# Patient Record
Sex: Male | Born: 1960
Health system: Southern US, Community
[De-identification: ages and names within clinical notes are randomized; demographics above are authoritative.]

## PROBLEM LIST (undated history)

## (undated) DIAGNOSIS — K219 Gastro-esophageal reflux disease without esophagitis: Secondary | ICD-10-CM

## (undated) DIAGNOSIS — I10 Essential (primary) hypertension: Secondary | ICD-10-CM

## (undated) DIAGNOSIS — F419 Anxiety disorder, unspecified: Secondary | ICD-10-CM

## (undated) DIAGNOSIS — N289 Disorder of kidney and ureter, unspecified: Secondary | ICD-10-CM

## (undated) DIAGNOSIS — F32A Depression, unspecified: Secondary | ICD-10-CM

## (undated) DIAGNOSIS — F329 Major depressive disorder, single episode, unspecified: Secondary | ICD-10-CM

## (undated) DIAGNOSIS — K769 Liver disease, unspecified: Secondary | ICD-10-CM

## (undated) DIAGNOSIS — F1010 Alcohol abuse, uncomplicated: Secondary | ICD-10-CM

---
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
## 2019-03-23 ENCOUNTER — Other Ambulatory Visit: Payer: Self-pay

## 2019-03-23 ENCOUNTER — Encounter (HOSPITAL_BASED_OUTPATIENT_CLINIC_OR_DEPARTMENT_OTHER): Payer: Self-pay | Admitting: *Deleted

## 2019-03-23 ENCOUNTER — Emergency Department (HOSPITAL_BASED_OUTPATIENT_CLINIC_OR_DEPARTMENT_OTHER): Payer: Self-pay

## 2019-03-23 ENCOUNTER — Inpatient Hospital Stay (HOSPITAL_BASED_OUTPATIENT_CLINIC_OR_DEPARTMENT_OTHER)
Admission: EM | Admit: 2019-03-23 | Discharge: 2019-04-05 | DRG: 854 | Disposition: A | Discharge status: Home Health | Payer: Self-pay | Attending: Family Medicine | Admitting: Family Medicine

## 2019-03-23 DIAGNOSIS — Z1159 Encounter for screening for other viral diseases: Secondary | ICD-10-CM

## 2019-03-23 DIAGNOSIS — K819 Cholecystitis, unspecified: Secondary | ICD-10-CM

## 2019-03-23 DIAGNOSIS — F10239 Alcohol dependence with withdrawal, unspecified: Secondary | ICD-10-CM | POA: Diagnosis present

## 2019-03-23 DIAGNOSIS — F17210 Nicotine dependence, cigarettes, uncomplicated: Secondary | ICD-10-CM | POA: Diagnosis present

## 2019-03-23 DIAGNOSIS — K8010 Calculus of gallbladder with chronic cholecystitis without obstruction: Secondary | ICD-10-CM | POA: Diagnosis present

## 2019-03-23 DIAGNOSIS — E860 Dehydration: Secondary | ICD-10-CM | POA: Diagnosis present

## 2019-03-23 DIAGNOSIS — R6889 Other general symptoms and signs: Secondary | ICD-10-CM

## 2019-03-23 DIAGNOSIS — R109 Unspecified abdominal pain: Secondary | ICD-10-CM

## 2019-03-23 DIAGNOSIS — R21 Rash and other nonspecific skin eruption: Secondary | ICD-10-CM | POA: Diagnosis present

## 2019-03-23 DIAGNOSIS — K760 Fatty (change of) liver, not elsewhere classified: Secondary | ICD-10-CM | POA: Diagnosis present

## 2019-03-23 DIAGNOSIS — R6510 Systemic inflammatory response syndrome (SIRS) of non-infectious origin without acute organ dysfunction: Secondary | ICD-10-CM | POA: Diagnosis present

## 2019-03-23 DIAGNOSIS — R1084 Generalized abdominal pain: Secondary | ICD-10-CM

## 2019-03-23 DIAGNOSIS — R0789 Other chest pain: Secondary | ICD-10-CM

## 2019-03-23 DIAGNOSIS — K2970 Gastritis, unspecified, without bleeding: Secondary | ICD-10-CM | POA: Diagnosis present

## 2019-03-23 DIAGNOSIS — I129 Hypertensive chronic kidney disease with stage 1 through stage 4 chronic kidney disease, or unspecified chronic kidney disease: Secondary | ICD-10-CM | POA: Diagnosis present

## 2019-03-23 DIAGNOSIS — N182 Chronic kidney disease, stage 2 (mild): Secondary | ICD-10-CM | POA: Diagnosis present

## 2019-03-23 DIAGNOSIS — M549 Dorsalgia, unspecified: Secondary | ICD-10-CM

## 2019-03-23 DIAGNOSIS — E872 Acidosis, unspecified: Secondary | ICD-10-CM | POA: Diagnosis present

## 2019-03-23 DIAGNOSIS — R072 Precordial pain: Secondary | ICD-10-CM

## 2019-03-23 DIAGNOSIS — K862 Cyst of pancreas: Secondary | ICD-10-CM | POA: Diagnosis present

## 2019-03-23 DIAGNOSIS — E878 Other disorders of electrolyte and fluid balance, not elsewhere classified: Secondary | ICD-10-CM | POA: Diagnosis present

## 2019-03-23 DIAGNOSIS — E876 Hypokalemia: Secondary | ICD-10-CM | POA: Diagnosis present

## 2019-03-23 DIAGNOSIS — K863 Pseudocyst of pancreas: Secondary | ICD-10-CM | POA: Diagnosis present

## 2019-03-23 DIAGNOSIS — K8689 Other specified diseases of pancreas: Secondary | ICD-10-CM

## 2019-03-23 DIAGNOSIS — E871 Hypo-osmolality and hyponatremia: Secondary | ICD-10-CM | POA: Diagnosis present

## 2019-03-23 DIAGNOSIS — A411 Sepsis due to other specified staphylococcus: Principal | ICD-10-CM | POA: Diagnosis present

## 2019-03-23 DIAGNOSIS — H109 Unspecified conjunctivitis: Secondary | ICD-10-CM | POA: Diagnosis present

## 2019-03-23 DIAGNOSIS — F1010 Alcohol abuse, uncomplicated: Secondary | ICD-10-CM | POA: Diagnosis present

## 2019-03-23 DIAGNOSIS — R10819 Abdominal tenderness, unspecified site: Secondary | ICD-10-CM

## 2019-03-23 DIAGNOSIS — I10 Essential (primary) hypertension: Secondary | ICD-10-CM | POA: Diagnosis present

## 2019-03-23 HISTORY — DX: Alcohol abuse, uncomplicated: F10.10

## 2019-03-23 HISTORY — DX: Disorder of kidney and ureter, unspecified: N28.9

## 2019-03-23 HISTORY — DX: Essential (primary) hypertension: I10

## 2019-03-23 HISTORY — DX: Liver disease, unspecified: K76.9

## 2019-03-23 LAB — SARS CORONAVIRUS 2 AG (30 MIN TAT): SARS Coronavirus 2 Ag: NEGATIVE

## 2019-03-23 LAB — URINALYSIS, ROUTINE W REFLEX MICROSCOPIC
Bilirubin Urine: NEGATIVE
Glucose, UA: NEGATIVE mg/dL
Hgb urine dipstick: NEGATIVE
Ketones, ur: NEGATIVE mg/dL
Leukocytes,Ua: NEGATIVE
Nitrite: NEGATIVE
Protein, ur: NEGATIVE mg/dL
Specific Gravity, Urine: 1.01 (ref 1.005–1.030)
pH: 8.5 — ABNORMAL HIGH (ref 5.0–8.0)

## 2019-03-23 LAB — LACTIC ACID, PLASMA
Lactic Acid, Venous: 4.7 mmol/L (ref 0.5–1.9)
Lactic Acid, Venous: 3.8 mmol/L (ref 0.5–1.9)

## 2019-03-23 LAB — COMPREHENSIVE METABOLIC PANEL
ALT: 59 U/L — ABNORMAL HIGH (ref 0–44)
AST: 170 U/L — ABNORMAL HIGH (ref 15–41)
Albumin: 3.1 g/dL — ABNORMAL LOW (ref 3.5–5.0)
Alkaline Phosphatase: 166 U/L — ABNORMAL HIGH (ref 38–126)
Anion gap: 12 (ref 5–15)
BUN: <5 mg/dL — ABNORMAL LOW (ref 6–20)
CO2: 33 mmol/L — ABNORMAL HIGH (ref 22–32)
Calcium: 8.5 mg/dL — ABNORMAL LOW (ref 8.9–10.3)
Chloride: 88 mmol/L — ABNORMAL LOW (ref 98–111)
Creatinine, Ser: 0.82 mg/dL (ref 0.61–1.24)
GFR calc Af Amer: >60 mL/min (ref >60)
GFR calc non Af Amer: >60 mL/min (ref >60)
Glucose, Bld: 126 mg/dL — ABNORMAL HIGH (ref 70–99)
Potassium: 3.4 mmol/L — ABNORMAL LOW (ref 3.5–5.1)
Sodium: 133 mmol/L — ABNORMAL LOW (ref 135–145)
Total Bilirubin: 3.8 mg/dL — ABNORMAL HIGH (ref 0.3–1.2)
Total Protein: 6.8 g/dL (ref 6.5–8.1)

## 2019-03-23 LAB — CBC WITH DIFFERENTIAL/PLATELET
Abs Immature Granulocytes: 0.02 K/uL (ref 0.00–0.07)
Basophils Absolute: 0 K/uL (ref 0.0–0.1)
Basophils Relative: 1 %
Eosinophils Absolute: 0 K/uL (ref 0.0–0.5)
Eosinophils Relative: 0 %
HCT: 42.5 % (ref 39.0–52.0)
Hemoglobin: 14.9 g/dL (ref 13.0–17.0)
Immature Granulocytes: 0 %
Lymphocytes Relative: 30 %
Lymphs Abs: 1.9 K/uL (ref 0.7–4.0)
MCH: 36.7 pg — ABNORMAL HIGH (ref 26.0–34.0)
MCHC: 35.1 g/dL (ref 30.0–36.0)
MCV: 104.7 fL — ABNORMAL HIGH (ref 80.0–100.0)
Monocytes Absolute: 0.6 K/uL (ref 0.1–1.0)
Monocytes Relative: 10 %
Neutro Abs: 3.8 K/uL (ref 1.7–7.7)
Neutrophils Relative %: 59 %
Platelets: 137 K/uL — ABNORMAL LOW (ref 150–400)
RBC: 4.06 MIL/uL — ABNORMAL LOW (ref 4.22–5.81)
RDW: 14.6 % (ref 11.5–15.5)
WBC: 6.4 K/uL (ref 4.0–10.5)
nRBC: 0 % (ref 0.0–0.2)

## 2019-03-23 LAB — D-DIMER, QUANTITATIVE: D-Dimer, Quant: 1.22 ug/mL-FEU — ABNORMAL HIGH (ref 0.00–0.50)

## 2019-03-23 LAB — TROPONIN I: Troponin I: <0.03 ng/mL (ref <0.03)

## 2019-03-23 MED ORDER — HEPARIN SODIUM (PORCINE) 5000 UNIT/ML IJ SOLN
5000.0000 [IU] | Freq: Three times a day (TID) | INTRAMUSCULAR | Status: DC
  Administered 2019-03-24 – 2019-03-29 (×13): 5000 [IU] via SUBCUTANEOUS
  Filled 2019-03-23 (×13): qty 1

## 2019-03-23 MED ORDER — SODIUM CHLORIDE 0.9 % IV SOLN
2.0000 g | Freq: Once | INTRAVENOUS | Status: AC
  Administered 2019-03-23: 2 g via INTRAVENOUS
  Filled 2019-03-23: qty 2

## 2019-03-23 MED ORDER — VANCOMYCIN HCL 1000 MG IV SOLR
INTRAVENOUS | Status: AC
  Filled 2019-03-23: qty 1000

## 2019-03-23 MED ORDER — IOHEXOL 350 MG/ML SOLN
100.0000 mL | Freq: Once | INTRAVENOUS | Status: AC | PRN
  Administered 2019-03-23: 100 mL via INTRAVENOUS

## 2019-03-23 MED ORDER — VANCOMYCIN HCL IN DEXTROSE 750-5 MG/150ML-% IV SOLN
750.0000 mg | Freq: Three times a day (TID) | INTRAVENOUS | Status: DC
  Administered 2019-03-24 – 2019-03-29 (×15): 750 mg via INTRAVENOUS
  Filled 2019-03-23 (×18): qty 150

## 2019-03-23 MED ORDER — ADULT MULTIVITAMIN W/MINERALS CH
1.0000 | ORAL_TABLET | Freq: Every day | ORAL | Status: DC
  Administered 2019-03-24 – 2019-04-05 (×13): 1 via ORAL
  Filled 2019-03-23 (×13): qty 1

## 2019-03-23 MED ORDER — SODIUM CHLORIDE 0.9 % IV BOLUS (SEPSIS)
1000.0000 mL | Freq: Once | INTRAVENOUS | Status: AC
  Administered 2019-03-23: 1000 mL via INTRAVENOUS

## 2019-03-23 MED ORDER — ONDANSETRON HCL 4 MG PO TABS
4.0000 mg | ORAL_TABLET | Freq: Four times a day (QID) | ORAL | Status: DC | PRN
  Administered 2019-03-24 – 2019-03-26 (×3): 4 mg via ORAL
  Filled 2019-03-23 (×3): qty 1

## 2019-03-23 MED ORDER — DEXTROSE-NACL 5-0.9 % IV SOLN
INTRAVENOUS | Status: DC
  Administered 2019-03-23 – 2019-03-26 (×4): via INTRAVENOUS

## 2019-03-23 MED ORDER — CEFEPIME HCL 2 G IJ SOLR
INTRAMUSCULAR | Status: AC
  Filled 2019-03-23: qty 2

## 2019-03-23 MED ORDER — SODIUM CHLORIDE 0.9 % IV BOLUS (SEPSIS)
250.0000 mL | Freq: Once | INTRAVENOUS | Status: AC
  Administered 2019-03-23: 250 mL via INTRAVENOUS

## 2019-03-23 MED ORDER — VITAMIN B-1 100 MG PO TABS
100.0000 mg | ORAL_TABLET | Freq: Every day | ORAL | Status: DC
  Administered 2019-03-24 – 2019-04-05 (×13): 100 mg via ORAL
  Filled 2019-03-23 (×13): qty 1

## 2019-03-23 MED ORDER — METRONIDAZOLE IN NACL 5-0.79 MG/ML-% IV SOLN
500.0000 mg | Freq: Once | INTRAVENOUS | Status: AC
  Filled 2019-03-23: qty 100

## 2019-03-23 MED ORDER — VANCOMYCIN HCL 500 MG IV SOLR
INTRAVENOUS | Status: AC
  Filled 2019-03-23: qty 500

## 2019-03-23 MED ORDER — SODIUM CHLORIDE 0.9 % IV SOLN
2.0000 g | Freq: Three times a day (TID) | INTRAVENOUS | Status: DC
  Administered 2019-03-24 – 2019-03-25 (×3): 2 g via INTRAVENOUS
  Filled 2019-03-23 (×6): qty 2

## 2019-03-23 MED ORDER — ONDANSETRON HCL 4 MG/2ML IJ SOLN
4.0000 mg | Freq: Four times a day (QID) | INTRAMUSCULAR | Status: DC | PRN
  Administered 2019-03-24 – 2019-04-01 (×4): 4 mg via INTRAVENOUS
  Filled 2019-03-23 (×4): qty 2

## 2019-03-23 MED ORDER — THIAMINE HCL 100 MG/ML IJ SOLN
Freq: Once | INTRAVENOUS | Status: AC
  Administered 2019-03-24: via INTRAVENOUS
  Filled 2019-03-23: qty 1000

## 2019-03-23 MED ORDER — SODIUM CHLORIDE 0.9 % IV BOLUS
1000.0000 mL | Freq: Once | INTRAVENOUS | Status: AC
  Administered 2019-03-23: 1000 mL via INTRAVENOUS

## 2019-03-23 MED ORDER — VANCOMYCIN HCL IN DEXTROSE 1-5 GM/200ML-% IV SOLN: 1000.0000 mg | Freq: Once | INTRAVENOUS | Status: DC

## 2019-03-23 MED ORDER — VANCOMYCIN HCL 10 G IV SOLR
1500.0000 mg | Freq: Once | INTRAVENOUS | Status: AC
  Administered 2019-03-23: 1500 mg via INTRAVENOUS
  Filled 2019-03-23: qty 1500

## 2019-03-23 NOTE — H&P (Addendum)
History and Physical   [name]:[id number] DOB: 21-Mar-1961 DOA: 03/23/2019  Referring MD/NP/PA: Dr Jacqulyn Bath, [facility]  PCP: Patient, No Pcp Per   Patient coming from: Home via [facility] [place]  Chief Complaint: Generalized weakness and body aches  HPI: Tom Bender is a 58 y.o. male with medical history significant of alcohol abuse, tobacco abuse, chronic kidney disease stage II, hypertension, presenting with progressive weakness body aches headaches chest pain and shortness of breath over the last 1 to 2 weeks.  Patient also reported some dizziness and falls.  Also abdominal pain in the epigastric region which is rated as 8 out of 10.  It starts from the epigastric region and goes downwards towards the umbilicus.  No association with meals.  Symptoms have been progressively getting worse.  Patient abuses alcohol and has had issues with alcoholism.  He was seen at [facility] where he was evaluated.  Afebrile but elevated LFTs.  CT abdomen pelvis which indicating possible pancreatic cyst.  Also patient having significant shortness of breath he is a smoker but no history of COPD.  He was tachypneic.  Patient being admitted to the hospital with SIRS.  Empiric antibiotics started..  ED Course: Temperature is 98.5 blood pressure 133/95 pulse 118 respirate of 28 oxygen sat 99% on room air.  Sodium 133 potassium 3.4 chloride 88 CO2 33 with glucose 126.  Alkaline phosphatase 166 albumin 3.1 AST 170 ALT 59 total bilirubin 3.8.  Lactic acid 4.7.  CBC within normal except for platelets 137.  Chest x-ray showed no active disease.  CT chest and abdomen and pelvis showed 1.7 x 0.8 cm cystic area in the tail of pancreas.  Sludge in gallbladder.  No other significant findings.  Hepatic steatosis also noted.  Patient to [facility] for possible source.  Review of Systems: As per HPI otherwise 10 point review of systems negative.    Past Medical History:  Diagnosis Date  . ETOH  abuse   . Hypertension   . Liver disease   . Renal insufficiency     History reviewed. No pertinent surgical history.   reports that he has been smoking. He has never used smokeless tobacco. He reports current alcohol use of about 12.0 standard drinks of alcohol per week. He reports previous drug use.  No Known Allergies  History reviewed. No pertinent family history.   Prior to Admission medications   Not on File    Physical Exam: Vitals:   03/23/19 1900 03/23/19 1930 03/23/19 2030 03/23/19 2158  BP: 121/89 (!) 122/94 (!) 116/94 112/87  Pulse: (!) 102 97 91 77  Resp: 12 13 15    Temp:    98.4 F (36.9 C)  TempSrc:    Oral  SpO2: 96% 94% 94% 99%  Weight:    76.7 kg  Height:    5\' 9"  (1.753 m)      Constitutional: NAD, anxious,  Vitals:   03/23/19 1900 03/23/19 1930 03/23/19 2030 03/23/19 2158  BP: 121/89 (!) 122/94 (!) 116/94 112/87  Pulse: (!) 102 97 91 77  Resp: 12 13 15    Temp:    98.4 F (36.9 C)  TempSrc:    Oral  SpO2: 96% 94% 94% 99%  Weight:    76.7 kg  Height:    5\' 9"  (1.753 m)   Eyes: PERRL, lids and conjunctivae normal ENMT: Mucous membranes are moist. Posterior pharynx clear of any exudate or lesions.Normal dentition.  Neck: normal, supple,  no masses, no thyromegaly Respiratory: clear to auscultation bilaterally, no wheezing, no crackles. Normal respiratory effort. No accessory muscle use.  Cardiovascular: Sinus tachycardia, no murmurs / rubs / gallops. No extremity edema. 2+ pedal pulses. No carotid bruits.  Abdomen: Mild abdominal, tenderness, no masses palpated. No hepatosplenomegaly. Bowel sounds positive.  Musculoskeletal: no clubbing / cyanosis. No joint deformity upper and lower extremities. Good ROM, no contractures. Normal muscle tone.  Skin: no rashes, lesions, ulcers. No induration Neurologic: CN 2-12 grossly intact. Sensation intact, DTR normal. Strength 5/5 in all 4.  Psychiatric: Normal judgment and insight. Alert and oriented x 3.  Normal mood.     Labs on Admission: I have personally reviewed following labs and imaging studies  CBC: Recent Labs  Lab 03/23/19 1606  WBC 6.4  NEUTROABS 3.8  HGB 14.9  HCT 42.5  MCV 104.7*  PLT 137*   Basic Metabolic Panel: Recent Labs  Lab 03/23/19 1606  NA 133*  K 3.4*  CL 88*  CO2 33*  GLUCOSE 126*  BUN <5*  CREATININE 0.82  CALCIUM 8.5*   GFR: Estimated Creatinine Clearance: 101.8 mL/min (by C-G formula based on SCr of 0.82 mg/dL). Liver Function Tests: Recent Labs  Lab 03/23/19 1606  AST 170*  ALT 59*  ALKPHOS 166*  BILITOT 3.8*  PROT 6.8  ALBUMIN 3.1*   No results for input(s): LIPASE, AMYLASE in the last 168 hours. No results for input(s): AMMONIA in the last 168 hours. Coagulation Profile: No results for input(s): INR, PROTIME in the last 168 hours. Cardiac Enzymes: Recent Labs  Lab 03/23/19 1606  TROPONINI <0.03   BNP (last 3 results) No results for input(s): PROBNP in the last 8760 hours. HbA1C: No results for input(s): HGBA1C in the last 72 hours. CBG: No results for input(s): GLUCAP in the last 168 hours. Lipid Profile: No results for input(s): CHOL, HDL, LDLCALC, TRIG, CHOLHDL, LDLDIRECT in the last 72 hours. Thyroid Function Tests: No results for input(s): TSH, T4TOTAL, FREET4, T3FREE, THYROIDAB in the last 72 hours. Anemia Panel: No results for input(s): VITAMINB12, FOLATE, FERRITIN, TIBC, IRON, RETICCTPCT in the last 72 hours. Urine analysis:    Component Value Date/Time   COLORURINE YELLOW 03/23/2019 1606   APPEARANCEUR CLEAR 03/23/2019 1606   LABSPEC 1.010 03/23/2019 1606   PHURINE 8.5 (H) 03/23/2019 1606   GLUCOSEU NEGATIVE 03/23/2019 1606   HGBUR NEGATIVE 03/23/2019 1606   BILIRUBINUR NEGATIVE 03/23/2019 1606   KETONESUR NEGATIVE 03/23/2019 1606   PROTEINUR NEGATIVE 03/23/2019 1606   NITRITE NEGATIVE 03/23/2019 1606   LEUKOCYTESUR NEGATIVE 03/23/2019 1606   Sepsis Labs: @LABRCNTIP (procalcitonin:4,lacticidven:4) )  Recent Results (from the past 240 hour(s))  Blood Culture (routine x 2)     Status: None (Preliminary result)   Collection Time: 03/23/19  4:00 PM  Result Value Ref Range Status   Specimen Description   Final    BLOOD RIGHT ANTECUBITAL Performed at [facility], [address] [place], [place] [address]    Special Requests   Final    BOTTLES DRAWN AEROBIC AND ANAEROBIC Blood Culture adequate volume Performed at [facility] [place], [address] [place], [place] [address]    Culture PENDING  Incomplete   Report Status PENDING  Incomplete  SARS Coronavirus 2 (Hosp order,Performed in [facility] lab via [employer] [facility])     Status: None   Collection Time: 03/23/19  5:41 PM  Result Value Ref Range Status   SARS Coronavirus 2 ([employer] Now) NEGATIVE NEGATIVE Final  Comment: (NOTE) Interpretive Result Comment(s): COVID 19 Positive SARS CoV 2 target nucleic acids are DETECTED. The SARS CoV 2 RNA is generally detectable in upper and lower respiratory specimens during the acute phase of infection.  Positive results are indicative of active infection with SARS CoV 2.  Clinical correlation with patient history and other diagnostic information is necessary to determine patient infection status.  Positive results do not rule out bacterial infection or coinfection with other viruses. The expected result is Negative. COVID 19 Negative SARS CoV 2 target nucleic acids are NOT DETECTED. The SARS CoV 2 RNA is generally detectable in upper and lower respiratory specimens during the acute phase of infection.  Negative results do not preclude SARS CoV 2 infection, do not rule out coinfections with other pathogens, and should not be used as the sole basis for treatment or other patient management decisions.  Negative results must be combined with clinical  observations, patient history, and epidemiological information. The expected result is Negative. Invalid Presence or absence of SARS  CoV 2 nucleic acids cannot be determined. Repeat testing was performed on the submitted specimen and repeated Invalid results were obtained.  If clinically indicated, additional testing on a new specimen with an alternate test methodology [id number]) is advised.  The SARS CoV 2 RNA is generally detectable in upper and lower respiratory specimens during the acute phase of infection. The expected result is Negative. Fact Sheet for Patients:  http://www.graves-ford.org/ Fact Sheet for Healthcare Providers: EnviroConcern.si This test is not yet approved or cleared by the [place] FDA and has been authorized for detection and/or diagnosis of SARS CoV 2 by FDA under an Emergency Use Authorization (EUA).  This EUA will remain in effect (meaning this test can be used) for the duration of the COVID19 d eclaration under Section 564(b)(1) of the Act, 21 U.S.C. section [phone] 3(b)(1), unless the authorization is terminated or revoked sooner. Performed at [facility], [address] [place], [place] [address]      Radiological Exams on Admission: Ct Angio Chest Pe W And/or Wo Contrast  Result Date: 03/23/2019 CLINICAL DATA:  Chest pain and cough. Elevated D-dimer. Abdominal pain with elevated lipase EXAM: CT ANGIOGRAPHY CHEST CT ABDOMEN AND PELVIS WITH CONTRAST TECHNIQUE: Multidetector CT imaging of the chest was performed using the standard protocol during bolus administration of intravenous contrast. Multiplanar CT image reconstructions and MIPs were obtained to evaluate the vascular anatomy. Multidetector CT imaging of the abdomen and pelvis was performed using the standard protocol during bolus administration of intravenous contrast. CONTRAST:  OMNIPAQUE IOHEXOL 350 MG/ML SOLN COMPARISON:  Chest radiograph March 23, 2019 FINDINGS: CTA CHEST FINDINGS Cardiovascular: There is no demonstrable pulmonary embolus. There is no thoracic aortic  aneurysm or dissection. There is tortuosity in the descending thoracic aorta. The visualized great vessels appear unremarkable. There is no pericardial effusion or pericardial thickening. Mediastinum/Nodes: Thyroid appears normal. There are subcentimeter mediastinal lymph nodes. There is no adenopathy by size criteria in the thorax. No esophageal lesions are demonstrable. Lungs/Pleura: There is underlying centrilobular emphysematous change. No edema or consolidation. No pleural effusion or pleural thickening evident. Musculoskeletal: There are no blastic or lytic bone lesions. There is degenerative change in the thoracic spine. There are no evident chest wall lesions. Review of the MIP images confirms the above findings. CT ABDOMEN and PELVIS FINDINGS Hepatobiliary: There is hepatic steatosis. No focal liver lesions are appreciable. There is apparent sludge in the gallbladder. There may be superimposed small gallstones  within the gallbladder. The gallbladder wall does not appear appreciably thickened. There is no biliary duct dilatation. Pancreas: In the tail of the pancreas, there is a small cystic area containing a focus of calcification measuring 1.7 x 0.8 cm. Elsewhere, there is no pancreatic mass or pancreatic duct dilatation. There is no peripancreatic fluid or pancreatic enlargement. Pancreatic enhancement is homogeneous throughout. Spleen: No splenic lesions are evident. Adrenals/Urinary Tract: Adrenals bilaterally appear normal. Kidneys bilaterally show no evident mass or hydronephrosis on either side. There is no appreciable renal or ureteral calculus on either side. Urinary bladder is midline with wall thickness within normal limits. Stomach/Bowel: There is no appreciable bowel wall or mesenteric thickening. No evident bowel obstruction. Terminal ileum appears unremarkable. There is no appreciable free air or portal venous air. Vascular/Lymphatic: There is no abdominal aortic aneurysm. There is aortic and  iliac artery atherosclerosis. There is no evident adenopathy in the abdomen or pelvis. Reproductive: There are a few tiny prostatic calculi. Prostate and seminal vesicles are normal in size and contour. There is no evident pelvic mass. Other: Appendix appears normal. There is no appreciable abscess or ascites in the abdomen or pelvis. Musculoskeletal: There is degenerative change in the lumbar spine. There are no blastic or lytic bone lesions. No abdominal wall lesions evident. Review of the MIP images confirms the above findings. IMPRESSION: CT angiogram chest: 1. No demonstrable pulmonary embolus. No thoracic aortic aneurysm or dissection. 2. Underlying centrilobular emphysematous change. No edema or consolidation. 3.  No demonstrable thoracic adenopathy. CT abdomen and pelvis: 1. There is a 1.7 x 0.8 cm cystic area in the tail of the pancreas containing a focus of calcification. Questions small cystic neoplasm versus residua of inflammation. Pancreas otherwise appears normal. That this lesion may warrant nonemergent pancreatic MR pre and post-contrast to further evaluate. 2. Sludge in gallbladder. There may be small intermingled gallstones. 3. No evident bowel obstruction. No abscess in the abdomen or pelvis. Appendix appears normal. 4. No evident renal or ureteral calculi. No hydronephrosis. Urinary bladder wall thickness normal. 5.  There is hepatic steatosis. 6.  There is aortoiliac atherosclerosis. Electronically Signed   By: Bretta Bang III M.D.   On: 03/23/2019 18:57   Ct Abdomen Pelvis W Contrast  Result Date: 03/23/2019 : CT angiogram chest as well as CT abdomen and pelvis reports are combined into a single dictation. Electronically Signed   By: Bretta Bang III M.D.   On: 03/23/2019 19:01   Dg Chest Portable 1 View  Result Date: 03/23/2019 CLINICAL DATA:  Generalized body aches. Headache. Chest pain. Shortness of breath. Cough. EXAM: PORTABLE CHEST 1 VIEW COMPARISON:  No prior. FINDINGS:  Mediastinum and hilar structures normal. Lungs are clear. No pleural effusion or pneumothorax. Heart size normal. Degenerative change thoracic spine. IMPRESSION: No acute cardiopulmonary disease. Electronically Signed   By: Maisie Fus  Register   On: 03/23/2019 16:25    Assessment/Plan Principal Problem:   SIRS (systemic inflammatory response syndrome) (HCC) Active Problems:   Hyponatremia   Hypokalemia   Benign essential HTN   Alcohol abuse   Lactic acidosis   Pancreatic cyst     #1 SIRS: Has lactic acidosis also.  More than likely alcoholic hepatitis but only mild elevation of LFTs.  Patient on empiric antibiotics.  Cultures already obtained and pending.  Hydrate patient and monitor.  Initial COVID-19 at [facility] is negative but it is thereabouts type.  Repeat in-house COVID-19 testing.  #2 hyponatremia: Appears to be secondary to  dehydration.  Hyponatremia with hypochloremia.  Check serum osmolality  #3 hypokalemia: Potassium repleted.  #4 alcohol abuse: Patient is showing no evidence of withdrawal.  Monitor for possible CIWA initiation.  Thiamine and folic acid  #5 pancreatic cyst on CT: No evidence of abdominal pain.  Evaluation for possible malignancy or pseudocyst.  May require MRI of the abdomen.  #6 hypertension: Monitor blood pressure and continue home regimen.     DVT prophylaxis: Heparin Code Status: Full code Family Communication: Care discussed fully with the patient Disposition Plan: Home Consults called: None Admission status: Inpatient  Severity of Illness: The appropriate patient status for this patient is INPATIENT. Inpatient status is judged to be reasonable and necessary in order to provide the required intensity of service to ensure the patient's safety. The patient's presenting symptoms, physical exam findings, and initial radiographic and laboratory data in the context of their chronic comorbidities is felt to place them at high risk for further  clinical deterioration. Furthermore, it is not anticipated that the patient will be medically stable for discharge from the hospital within 2 midnights of admission. The following factors support the patient status of inpatient.   " The patient's presenting symptoms include generalized weakness shortness of breath. " The worrisome physical exam findings include palpitation " The initial radiographic and laboratory data are worrisome because of CT findings. " The chronic co-morbidities include alcoholism.   * I certify that at the point of admission it is my clinical judgment that the patient will require inpatient hospital care spanning beyond 2 midnights from the point of admission due to high intensity of service, high risk for further deterioration and high frequency of surveillance required.Lonia Blood MD [facility] Pager [phone]  If 7PM-7AM, please contact night-coverage [email or web address] Password [email or web address]  03/23/2019, 10:59 PM

## 2019-03-23 NOTE — ED Provider Notes (Signed)
Emergency Department Provider Note   I have reviewed the triage vital signs and the nursing notes.   HISTORY  Chief Complaint Generalized Body Aches   HPI Tom Bender is a 58 y.o. male with PMH of EtOH abuse, CKD, and HTN presents to the emergency department for evaluation of generalized body ache, headache, chest pain, epigastric abdominal pain, shortness of breath, dizziness, falls.  Patient states symptoms have been worsening over the past several days.  He does have history of alcohol use/abuse.  He denies fevers but feels he could have had some which were not recorded.  He has had some diaphoresis and occasional shaking chills.  His chest pain is constant and central.  Shortness of breath is also constant.  He does smoke cigarettes but denies known history of COPD.  He denies known sick contacts.  His abdominal pain is mainly epigastric and not particularly worse with eating.  No vomiting but has had multiple episodes of diarrhea.  No blood in the diarrhea.  Past Medical History:  Diagnosis Date  . ETOH abuse   . Hypertension   . Liver disease   . Renal insufficiency     Patient Active Problem List   Diagnosis Date Noted  . SIRS (systemic inflammatory response syndrome) (HCC) 03/23/2019  . Hyponatremia 03/23/2019  . Hypokalemia 03/23/2019  . Benign essential HTN 03/23/2019  . Alcohol abuse 03/23/2019  . Lactic acidosis 03/23/2019  . Pancreatic cyst 03/23/2019    History reviewed. No pertinent surgical history.  Allergies Patient has no known allergies.  History reviewed. No pertinent family history.  Social History Social History   Tobacco Use  . Smoking status: Current Every Day Smoker  . Smokeless tobacco: Never Used  Substance Use Topics  . Alcohol use: Yes    Alcohol/week: 12.0 standard drinks    Types: 12 Cans of beer per week  . Drug use: Not Currently    Review of Systems  Constitutional: Subjective fever/chills Eyes: No visual changes. ENT:  No sore throat. Cardiovascular: Positive chest pain. Respiratory: Positive shortness of breath. Gastrointestinal: Positive epigastric abdominal pain. Positive nausea, no vomiting. Positive diarrhea.  No constipation. Genitourinary: Negative for dysuria. Musculoskeletal: Negative for back pain. Skin: Negative for rash. Neurological: Negative for focal weakness or numbness. Positive HA.   10-point ROS otherwise negative.  ____________________________________________   PHYSICAL EXAM:  VITAL SIGNS: ED Triage Vitals  Enc Vitals Group     BP 03/23/19 1538 (!) 133/95     Pulse Rate 03/23/19 1538 (!) 118     Resp 03/23/19 1538 (!) 28     Temp 03/23/19 1538 98.4 F (36.9 C)     Temp Source 03/23/19 1538 Oral     SpO2 03/23/19 1538 97 %     Weight 03/23/19 1538 160 lb (72.6 kg)     Height 03/23/19 1538  (1.753 m)     Pain Score 03/23/19 1537 9   Constitutional: Alert and oriented. Well appearing and in no acute distress. Eyes: Conjunctivae are normal.  Head: Atraumatic. Nose: No congestion/rhinnorhea. Mouth/Throat: Mucous membranes are moist.  Neck: No stridor.   Cardiovascular: Tachycardia. Good peripheral circulation. Grossly normal heart sounds.   Respiratory: Normal respiratory effort.  No retractions. Lungs CTAB. Gastrointestinal: Soft and nontender. No distention.  Musculoskeletal: No lower extremity tenderness nor edema. No gross deformities of extremities. Neurologic:  Normal speech and language. No gross focal neurologic deficits are appreciated.  Skin:  Skin is warm, dry and intact. No rash noted.  ____________________________________________   LABS (all labs ordered are listed, but only abnormal results are displayed)  Labs Reviewed  COMPREHENSIVE METABOLIC PANEL - Abnormal; Notable for the following components:      Result Value   Sodium 133 (*)    Potassium 3.4 (*)    Chloride 88 (*)    CO2 33 (*)    Glucose, Bld 126 (*)    BUN <5 (*)    Calcium 8.5  (*)    Albumin 3.1 (*)    AST 170 (*)    ALT 59 (*)    Alkaline Phosphatase 166 (*)    Total Bilirubin 3.8 (*)    All other components within normal limits  LACTIC ACID, PLASMA - Abnormal; Notable for the following components:   Lactic Acid, Venous 4.7 (*)    All other components within normal limits  LACTIC ACID, PLASMA - Abnormal; Notable for the following components:   Lactic Acid, Venous 3.8 (*)    All other components within normal limits  CBC WITH DIFFERENTIAL/PLATELET - Abnormal; Notable for the following components:   RBC 4.06 (*)    MCV 104.7 (*)    MCH 36.7 (*)    Platelets 137 (*)    All other components within normal limits  URINALYSIS, ROUTINE W REFLEX MICROSCOPIC - Abnormal; Notable for the following components:   pH 8.5 (*)    All other components within normal limits  D-DIMER, QUANTITATIVE (NOT AT [facility]) - Abnormal; Notable for the following components:   D-Dimer, Quant 1.22 (*)    All other components within normal limits  SARS CORONAVIRUS 2 (HOSP ORDER, PERFORMED IN [facility] VIA ABBOTT ID)  CULTURE, BLOOD (ROUTINE X 2)  URINE CULTURE  CULTURE, BLOOD (ROUTINE X 2)  SARS CORONAVIRUS 2 (HOSPITAL ORDER, PERFORMED IN [facility])  TROPONIN I  HIV ANTIBODY (ROUTINE TESTING W REFLEX)  CBC  CREATININE, SERUM  COMPREHENSIVE METABOLIC PANEL  CBC   ____________________________________________  EKG   EKG Interpretation  Date/Time:  Friday March 23 2019 16:23:45 EDT Ventricular Rate:  93 PR Interval:    QRS Duration: 82 QT Interval:  372 QTC Calculation: 463 R Axis:   34 Text Interpretation:  Sinus rhythm No STEMI  Confirmed by Alona BeneLong, Joshua [phone]) on 03/23/2019 5:54:09 PM       ____________________________________________  RADIOLOGY  Ct Angio Chest Pe W And/or Wo Contrast  Result Date: 03/23/2019 CLINICAL DATA:  Chest pain and cough. Elevated D-dimer. Abdominal pain with elevated lipase EXAM: CT ANGIOGRAPHY CHEST CT ABDOMEN AND PELVIS  WITH CONTRAST TECHNIQUE: Multidetector CT imaging of the chest was performed using the standard protocol during bolus administration of intravenous contrast. Multiplanar CT image reconstructions and MIPs were obtained to evaluate the vascular anatomy. Multidetector CT imaging of the abdomen and pelvis was performed using the standard protocol during bolus administration of intravenous contrast. CONTRAST:  100mL OMNIPAQUE IOHEXOL 350 MG/ML SOLN COMPARISON:  Chest radiograph March 23, 2019 FINDINGS: CTA CHEST FINDINGS Cardiovascular: There is no demonstrable pulmonary embolus. There is no thoracic aortic aneurysm or dissection. There is tortuosity in the descending thoracic aorta. The visualized great vessels appear unremarkable. There is no pericardial effusion or pericardial thickening. Mediastinum/Nodes: Thyroid appears normal. There are subcentimeter mediastinal lymph nodes. There is no adenopathy by size criteria in the thorax. No esophageal lesions are demonstrable. Lungs/Pleura: There is underlying centrilobular emphysematous change. No edema or consolidation. No pleural effusion or pleural thickening evident. Musculoskeletal: There are no blastic or lytic bone lesions.  There is degenerative change in the thoracic spine. There are no evident chest wall lesions. Review of the MIP images confirms the above findings. CT ABDOMEN and PELVIS FINDINGS Hepatobiliary: There is hepatic steatosis. No focal liver lesions are appreciable. There is apparent sludge in the gallbladder. There may be superimposed small gallstones within the gallbladder. The gallbladder wall does not appear appreciably thickened. There is no biliary duct dilatation. Pancreas: In the tail of the pancreas, there is a small cystic area containing a focus of calcification measuring 1.7 x 0.8 cm. Elsewhere, there is no pancreatic mass or pancreatic duct dilatation. There is no peripancreatic fluid or pancreatic enlargement. Pancreatic enhancement is  homogeneous throughout. Spleen: No splenic lesions are evident. Adrenals/Urinary Tract: Adrenals bilaterally appear normal. Kidneys bilaterally show no evident mass or hydronephrosis on either side. There is no appreciable renal or ureteral calculus on either side. Urinary bladder is midline with wall thickness within normal limits. Stomach/Bowel: There is no appreciable bowel wall or mesenteric thickening. No evident bowel obstruction. Terminal ileum appears unremarkable. There is no appreciable free air or portal venous air. Vascular/Lymphatic: There is no abdominal aortic aneurysm. There is aortic and iliac artery atherosclerosis. There is no evident adenopathy in the abdomen or pelvis. Reproductive: There are a few tiny prostatic calculi. Prostate and seminal vesicles are normal in size and contour. There is no evident pelvic mass. Other: Appendix appears normal. There is no appreciable abscess or ascites in the abdomen or pelvis. Musculoskeletal: There is degenerative change in the lumbar spine. There are no blastic or lytic bone lesions. No abdominal wall lesions evident. Review of the MIP images confirms the above findings. IMPRESSION: CT angiogram chest: 1. No demonstrable pulmonary embolus. No thoracic aortic aneurysm or dissection. 2. Underlying centrilobular emphysematous change. No edema or consolidation. 3.  No demonstrable thoracic adenopathy. CT abdomen and pelvis: 1. There is a 1.7 x 0.8 cm cystic area in the tail of the pancreas containing a focus of calcification. Questions small cystic neoplasm versus residua of inflammation. Pancreas otherwise appears normal. That this lesion may warrant nonemergent pancreatic MR pre and post-contrast to further evaluate. 2. Sludge in gallbladder. There may be small intermingled gallstones. 3. No evident bowel obstruction. No abscess in the abdomen or pelvis. Appendix appears normal. 4. No evident renal or ureteral calculi. No hydronephrosis. Urinary bladder wall  thickness normal. 5.  There is hepatic steatosis. 6.  There is aortoiliac atherosclerosis. Electronically Signed   By: Bretta Bang III M.D.   On: 03/23/2019 18:57   Ct Abdomen Pelvis W Contrast  Result Date: 03/23/2019 : CT angiogram chest as well as CT abdomen and pelvis reports are combined into a single dictation. Electronically Signed   By: Bretta Bang III M.D.   On: 03/23/2019 19:01   Dg Chest Portable 1 View  Result Date: 03/23/2019 CLINICAL DATA:  Generalized body aches. Headache. Chest pain. Shortness of breath. Cough. EXAM: PORTABLE CHEST 1 VIEW COMPARISON:  No prior. FINDINGS: Mediastinum and hilar structures normal. Lungs are clear. No pleural effusion or pneumothorax. Heart size normal. Degenerative change thoracic spine. IMPRESSION: No acute cardiopulmonary disease. Electronically Signed   By: Maisie Fus  Register   On: 03/23/2019 16:25    ____________________________________________   PROCEDURES  Procedure(s) performed:   Procedures  CRITICAL CARE Performed by: Maia Plan Total critical care time: 35 minutes Critical care time was exclusive of separately billable procedures and treating other patients. Critical care was necessary to treat or prevent imminent or life-threatening deterioration.  Critical care was time spent personally by me on the following activities: development of treatment plan with patient and/or surrogate as well as nursing, discussions with consultants, evaluation of patient's response to treatment, examination of patient, obtaining history from patient or surrogate, ordering and performing treatments and interventions, ordering and review of laboratory studies, ordering and review of radiographic studies, pulse oximetry and re-evaluation of patient's condition.  Alona Bene, MD Emergency Medicine  ____________________________________________   INITIAL IMPRESSION / ASSESSMENT AND PLAN / ED COURSE  Pertinent labs & imaging results that  were available during my care of the patient were reviewed by me and considered in my medical decision making (see chart for details).   Patient presents to the emergency department with multiple falls in the setting of constitutional type symptoms including body aches, subjective fevers, coughing. COVID is a consideration but patient also with multiple medical comorbidities and risk factors which could lend itself to underlying serious bacterial infection.  Patient also at risk for PE given his smoking history.  Abdomen with diffuse mild tenderness but no focal severe discomfort or peritoneal findings.  To start with initial screening lab work including lactate, d-dimer, troponin.  EKG shows sinus rhythm with no acute ischemic findings.   CT imaging reviewed.  Sludge in the gallbladder but no gallbladder wall inflammation or biliary duct dilation.  No clear source for fever and elevated lactate.  Covered with broad-spectrum antibiotics initially.  Plan for admit for sepsis without a source at this time.  Patient does drink heavily which may be doing some of his biliary lab work.  Possible cholangitis was considered but antibiotic should cover for this pending admit.   Discussed patient's case with Hospitalist, Dr. Toniann Fail to request admission. Patient and family (if present) updated with plan. Care transferred to Hospitalist service.  I reviewed all nursing notes, vitals, pertinent old records, EKGs, labs, imaging (as available).  ____________________________________________  FINAL CLINICAL IMPRESSION(S) / ED DIAGNOSES  Final diagnoses:  Precordial chest pain  Generalized abdominal pain  Flu-like symptoms  Lactic acidosis     MEDICATIONS GIVEN DURING THIS VISIT:  Medications  metroNIDAZOLE (FLAGYL) IVPB 500 mg (has no administration in time range)  ceFEPIme (MAXIPIME) 2 g injection (  Not Given 03/23/19 1720)  ceFEPIme (MAXIPIME) 2 g in sodium chloride 0.9 % 100 mL IVPB (has no  administration in time range)  vancomycin (VANCOCIN) IVPB 750 mg/150 ml premix (has no administration in time range)  vancomycin (VANCOCIN) 1000 MG powder (  Not Given 03/23/19 1842)  ceFEPIme (MAXIPIME) 2 g injection (  Not Given 03/23/19 1729)  vancomycin (VANCOCIN) 500 MG powder (  Not Given 03/23/19 1843)  heparin injection 5,000 Units (has no administration in time range)  sodium chloride 0.9 % 1,000 mL with thiamine 100 mg, folic acid 1 mg, multivitamins adult 10 mL infusion (has no administration in time range)  dextrose 5 %-0.9 % sodium chloride infusion (has no administration in time range)  ondansetron (ZOFRAN) tablet 4 mg (has no administration in time range)    Or  ondansetron (ZOFRAN) injection 4 mg (has no administration in time range)  multivitamin with minerals tablet 1 tablet (has no administration in time range)  thiamine (VITAMIN B-1) tablet 100 mg (has no administration in time range)  sodium chloride 0.9 % bolus 1,000 mL (0 mLs Intravenous Stopped 03/23/19 1730)  ceFEPIme (MAXIPIME) 2 g in sodium chloride 0.9 % 100 mL IVPB (0 g Intravenous Stopped 03/23/19 1825)  vancomycin (VANCOCIN) 1,500 mg in  sodium chloride 0.9 % 500 mL IVPB ( Intravenous Stopped 03/23/19 2021)  sodium chloride 0.9 % bolus 1,000 mL (0 mLs Intravenous Stopped 03/23/19 1825)    And  sodium chloride 0.9 % bolus 250 mL (0 mLs Intravenous Stopped 03/23/19 1852)  iohexol (OMNIPAQUE) 350 MG/ML injection 100 mL (100 mLs Intravenous Contrast Given 03/23/19 1836)    Note:  This document was prepared using Dragon voice recognition software and may include unintentional dictation errors.  Alona Bene, MD Emergency Medicine    Long, Arlyss Repress, MD 03/23/19 [id number]

## 2019-03-23 NOTE — ED Triage Notes (Signed)
Pt c/o generalized body aches , h/a, chest pain, SOB  And dizziness with falls

## 2019-03-23 NOTE — ED Notes (Signed)
Pt aware of need for urine specimen.  Provided urinal.

## 2019-03-23 NOTE — ED Notes (Signed)
Patient transported to CT 

## 2019-03-23 NOTE — ED Notes (Signed)
Pt aware of need for urine specimen.  Unable to provide one at this time.

## 2019-03-23 NOTE — Progress Notes (Signed)
Pharmacy Antibiotic Note  Tom Bender is a 58 y.o. male admitted on 03/23/2019 with sepsis.  Pharmacy has been consulted for vancomycin/cefepime dosing. Afebrile, LA 4.7. SCr 0.82 on admit.  Plan: Cefepime 2g IV x 1; then 2g IV q8h Vancomycin 1500mg  IV x1; then Vancomycin 750 mg IV Q 8 hrs. Goal AUC 400-550. Expected AUC: 484 SCr used: 0.82 Flagyl 500mg  IV x 1 per EDP Monitor clinical progress, c/s, renal function F/u de-escalation plan/LOT, vancomycin trough as indicated   Height: 5\' 9"  (175.3 cm) Weight: 160 lb (72.6 kg) IBW/kg (Calculated) : 70.7  Temp (24hrs), Avg:98.4 F (36.9 C), Min:98.4 F (36.9 C), Max:98.4 F (36.9 C)  Recent Labs  Lab 03/23/19 1606  CREATININE 0.82  LATICACIDVEN 4.7*    Estimated Creatinine Clearance: 101.8 mL/min (by C-G formula based on SCr of 0.82 mg/dL).    Allergies not on file  Antimicrobials this admission: 6/5 vancomycin >>  6/5 cefepime >>  6/5 flagyl >>  Dose adjustments this admission:   Microbiology results:   Tom Bender, PharmD, BCPS Clinical Pharmacist 03/23/2019 4:57 PM

## 2019-03-24 ENCOUNTER — Observation Stay (HOSPITAL_COMMUNITY): Payer: Self-pay

## 2019-03-24 DIAGNOSIS — R1084 Generalized abdominal pain: Secondary | ICD-10-CM

## 2019-03-24 DIAGNOSIS — E872 Acidosis: Secondary | ICD-10-CM

## 2019-03-24 DIAGNOSIS — E876 Hypokalemia: Secondary | ICD-10-CM

## 2019-03-24 DIAGNOSIS — E871 Hypo-osmolality and hyponatremia: Secondary | ICD-10-CM

## 2019-03-24 DIAGNOSIS — K862 Cyst of pancreas: Secondary | ICD-10-CM

## 2019-03-24 DIAGNOSIS — I10 Essential (primary) hypertension: Secondary | ICD-10-CM

## 2019-03-24 DIAGNOSIS — F1010 Alcohol abuse, uncomplicated: Secondary | ICD-10-CM

## 2019-03-24 LAB — POTASSIUM: Potassium: 2.7 mmol/L — CL (ref 3.5–5.1)

## 2019-03-24 LAB — COMPREHENSIVE METABOLIC PANEL
ALT: 48 U/L — ABNORMAL HIGH (ref 0–44)
AST: 126 U/L — ABNORMAL HIGH (ref 15–41)
Albumin: 2.5 g/dL — ABNORMAL LOW (ref 3.5–5.0)
Alkaline Phosphatase: 140 U/L — ABNORMAL HIGH (ref 38–126)
Anion gap: 11 (ref 5–15)
BUN: <5 mg/dL — ABNORMAL LOW (ref 6–20)
CO2: 28 mmol/L (ref 22–32)
Calcium: 7.8 mg/dL — ABNORMAL LOW (ref 8.9–10.3)
Chloride: 97 mmol/L — ABNORMAL LOW (ref 98–111)
Creatinine, Ser: 0.73 mg/dL (ref 0.61–1.24)
GFR calc Af Amer: >60 mL/min (ref >60)
GFR calc non Af Amer: >60 mL/min (ref >60)
Glucose, Bld: 122 mg/dL — ABNORMAL HIGH (ref 70–99)
Potassium: 2.9 mmol/L — ABNORMAL LOW (ref 3.5–5.1)
Sodium: 136 mmol/L (ref 135–145)
Total Bilirubin: 3.3 mg/dL — ABNORMAL HIGH (ref 0.3–1.2)
Total Protein: 5.9 g/dL — ABNORMAL LOW (ref 6.5–8.1)

## 2019-03-24 LAB — BLOOD CULTURE ID PANEL (REFLEXED)

## 2019-03-24 LAB — MAGNESIUM: Magnesium: 1.4 mg/dL — ABNORMAL LOW (ref 1.7–2.4)

## 2019-03-24 LAB — LACTIC ACID, PLASMA: Lactic Acid, Venous: 1.7 mmol/L (ref 0.5–1.9)

## 2019-03-24 LAB — CBC
HCT: 37.3 % — ABNORMAL LOW (ref 39.0–52.0)
Hemoglobin: 13.1 g/dL (ref 13.0–17.0)
MCH: 36.5 pg — ABNORMAL HIGH (ref 26.0–34.0)
MCHC: 35.1 g/dL (ref 30.0–36.0)
MCV: 103.9 fL — ABNORMAL HIGH (ref 80.0–100.0)
Platelets: 112 10*3/uL — ABNORMAL LOW (ref 150–400)
RBC: 3.59 MIL/uL — ABNORMAL LOW (ref 4.22–5.81)
RDW: 15.1 % (ref 11.5–15.5)
WBC: 5.9 10*3/uL (ref 4.0–10.5)
nRBC: 0 % (ref 0.0–0.2)

## 2019-03-24 LAB — SARS CORONAVIRUS 2: SARS Coronavirus 2: NOT DETECTED

## 2019-03-24 LAB — LIPASE, BLOOD: Lipase: 35 U/L (ref 11–51)

## 2019-03-24 MED ORDER — LORAZEPAM 2 MG/ML IJ SOLN
1.0000 mg | Freq: Four times a day (QID) | INTRAMUSCULAR | Status: AC | PRN
  Administered 2019-03-25 – 2019-03-26 (×4): 1 mg via INTRAVENOUS
  Filled 2019-03-24 (×4): qty 1

## 2019-03-24 MED ORDER — MORPHINE SULFATE (PF) 2 MG/ML IV SOLN
2.0000 mg | INTRAVENOUS | Status: DC | PRN
  Administered 2019-03-24 – 2019-03-29 (×16): 2 mg via INTRAVENOUS
  Filled 2019-03-24 (×16): qty 1

## 2019-03-24 MED ORDER — FOLIC ACID 1 MG PO TABS
1.0000 mg | ORAL_TABLET | Freq: Every day | ORAL | Status: DC
  Administered 2019-03-24 – 2019-04-05 (×13): 1 mg via ORAL
  Filled 2019-03-24 (×13): qty 1

## 2019-03-24 MED ORDER — MAGNESIUM SULFATE 50 % IJ SOLN
3.0000 g | Freq: Once | INTRAVENOUS | Status: AC
  Administered 2019-03-24: 3 g via INTRAVENOUS
  Filled 2019-03-24: qty 6

## 2019-03-24 MED ORDER — LORAZEPAM 1 MG PO TABS
1.0000 mg | ORAL_TABLET | Freq: Four times a day (QID) | ORAL | Status: AC | PRN
  Administered 2019-03-24 – 2019-03-27 (×4): 1 mg via ORAL
  Filled 2019-03-24 (×4): qty 1

## 2019-03-24 MED ORDER — GADOBUTROL 1 MMOL/ML IV SOLN
8.0000 mL | Freq: Once | INTRAVENOUS | Status: AC | PRN
  Administered 2019-03-24: 8 mL via INTRAVENOUS

## 2019-03-24 MED ORDER — POTASSIUM CHLORIDE 10 MEQ/100ML IV SOLN
10.0000 meq | INTRAVENOUS | Status: AC
  Administered 2019-03-24 (×2): 10 meq via INTRAVENOUS
  Filled 2019-03-24 (×2): qty 100

## 2019-03-24 MED ORDER — ADULT MULTIVITAMIN W/MINERALS CH: 1.0000 | ORAL_TABLET | Freq: Every day | ORAL | Status: DC

## 2019-03-24 MED ORDER — POTASSIUM CHLORIDE CRYS ER 20 MEQ PO TBCR
40.0000 meq | EXTENDED_RELEASE_TABLET | Freq: Two times a day (BID) | ORAL | Status: AC
  Administered 2019-03-24 (×2): 40 meq via ORAL
  Filled 2019-03-24 (×2): qty 2

## 2019-03-24 NOTE — Progress Notes (Signed)
CRITICAL VALUE ALERT  Critical Value: K 2.7  Date & Time Notied:  03/24/2019 1055  Provider Notified: Yes  Orders Received/Actions taken: PO Potassium 40 mEq and IV Potassium x2.

## 2019-03-24 NOTE — Progress Notes (Signed)
PROGRESS NOTE    Dayquan Buys  WUJ:[id number] DOB: 04/22/1961 DOA: 03/23/2019 PCP: Patient, No Pcp Per    Brief Narrative: Divante Kotch is a 58 y.o. male with medical history significant of alcohol abuse, tobacco abuse, chronic kidney disease stage II, hypertension, presenting with progressive weakness body aches headaches chest pain and shortness of breath over the last 1 to 2 weeks.  Patient also reported some dizziness and falls.  Also abdominal pain in the epigastric region which is rated as 8 out of 10.  It starts from the epigastric region and goes downwards towards the umbilicus. He was admitted for evaluation of SIRS.    Assessment & Plan:   Principal Problem:   SIRS (systemic inflammatory response syndrome) (HCC) Active Problems:   Hyponatremia   Hypokalemia   Benign essential HTN   Alcohol abuse   Lactic acidosis   Pancreatic cyst  SIRS:  Unclear etiology. ? Acute cholecystitis vs alcoholic hepatitis vs dehydration. Blood cultures pending . No respiratory symptoms. UA is negative. Pt reports some epigastric pain. Lipase levels ordered.  Lactic acid normalized. Gently hydrate with IV fluids.  MRI of the abdomen shows Cystic lesion within the pancreatic tail, favored to represent a pseudocyst. Indolent cystic neoplasm could look similar. Per onsensus criteria, this warrants follow-up with pre and post-contrast MRI/MRCP at 1 year. Cholelithiasis with suspicion of mild gallbladder wall thickening. No specific evidence of acute cholecystitis.  He is afebrile and wbc count is wnl.   Hypokalemia and hypomagnesemia:  Replaced.   Essential hypertension;  Pt reports he was on metoprolol , but ran out  Of the medication.  Normotensive at this time.     Alcohol abuse;  On CIWA protocol.  Pt is having some withdrawal symptoms, tachycardic, tremulous, having tremors in his hands.   Hyponatremia: resolved with hydration.    Dehydration:  Continue with IV fluids for another  24 hours.      DVT prophylaxis: heparin  Code Status: full code.  Family Communication: none at bedside.  Disposition Plan: pending clinical improvement.   Consultants:   None.    Procedures: MRI of the abdomen.   Antimicrobials: vancomycin and cefepime.   Subjective: He reports feeling weak, exhausted and unable to walk from weakness.   Objective: Vitals:   03/23/19 2030 03/23/19 2158 03/24/19 0545 03/24/19 1158  BP: (!) 116/94 112/87 117/90 103/87  Pulse: 91 77 82 (!) 110  Resp: 15     Temp:  98.4 F (36.9 C) 98.2 F (36.8 C)   TempSrc:  Oral Oral   SpO2: 94% 99% 96%   Weight:  76.7 kg    Height:   (1.753 m)      Intake/Output Summary (Last 24 hours) at 03/24/2019 1226 Last data filed at 03/24/2019 0931 Gross per 24 hour  Intake 998.65 ml  Output 1050 ml  Net -51.35 ml   Filed Weights   03/23/19 1538 03/23/19 2158  Weight: 72.6 kg 76.7 kg    Examination:  General exam: Appears mild distress from alcohol withdrawal symptoms.  Respiratory system: Clear to auscultation. Respiratory effort normal. Cardiovascular system: S1 & S2 heard, tachycardic.  No JVD, . Gastrointestinal system: Abdomen is soft, mildly tender in the epigastric area.  Central nervous system: Alert and oriented. No focal neurological deficits. Extremities: Symmetric 5 x 5 power. Skin: No rashes, lesions or ulcers Psychiatry:  Mood & affect appropriate.     Data Reviewed: I have personally reviewed following labs and imaging studies  CBC:  Recent Labs  Lab 03/23/19 1606 03/24/19 0021  WBC 6.4 5.9  NEUTROABS 3.8  --   HGB 14.9 13.1  HCT 42.5 37.3*  MCV 104.7* 103.9*  PLT 137* 154*   Basic Metabolic Panel: Recent Labs  Lab 03/23/19 1606 03/24/19 0021 03/24/19 0950  NA 133* 136  --   K 3.4* 2.9* 2.7*  CL 88* 97*  --   CO2 33* 28  --   GLUCOSE 126* 122*  --   BUN <5* <5*  --   CREATININE 0.82 0.73  --   CALCIUM 8.5* 7.8*  --   MG  --   --  1.4*   GFR: Estimated  Creatinine Clearance: 104.3 mL/min (by C-G formula based on SCr of 0.73 mg/dL). Liver Function Tests: Recent Labs  Lab 03/23/19 1606 03/24/19 0021  AST 170* 126*  ALT 59* 48*  ALKPHOS 166* 140*  BILITOT 3.8* 3.3*  PROT 6.8 5.9*  ALBUMIN 3.1* 2.5*   No results for input(s): LIPASE, AMYLASE in the last 168 hours. No results for input(s): AMMONIA in the last 168 hours. Coagulation Profile: No results for input(s): INR, PROTIME in the last 168 hours. Cardiac Enzymes: Recent Labs  Lab 03/23/19 1606  TROPONINI <0.03   BNP (last 3 results) No results for input(s): PROBNP in the last 8760 hours. HbA1C: No results for input(s): HGBA1C in the last 72 hours. CBG: No results for input(s): GLUCAP in the last 168 hours. Lipid Profile: No results for input(s): CHOL, HDL, LDLCALC, TRIG, CHOLHDL, LDLDIRECT in the last 72 hours. Thyroid Function Tests: No results for input(s): TSH, T4TOTAL, FREET4, T3FREE, THYROIDAB in the last 72 hours. Anemia Panel: No results for input(s): VITAMINB12, FOLATE, FERRITIN, TIBC, IRON, RETICCTPCT in the last 72 hours. Sepsis Labs: Recent Labs  Lab 03/23/19 1606 03/23/19 1745 03/24/19 0950  LATICACIDVEN 4.7* 3.8* 1.7    Recent Results (from the past 240 hour(s))  Blood Culture (routine x 2)     Status: None (Preliminary result)   Collection Time: 03/23/19  4:00 PM  Result Value Ref Range Status   Specimen Description   Final    BLOOD RIGHT ANTECUBITAL Performed at [facility], [place] [address] [place], [place] [address]    Special Requests   Final    BOTTLES DRAWN AEROBIC AND ANAEROBIC Blood Culture adequate volume Performed at [facility], [address] [place], [place] [address]    Culture   Final    NO GROWTH < 12 HOURS Performed at [facility] [address], [place], [place] [address]    Report Status PENDING  Incomplete  Blood Culture (routine x 2)     Status: None (Preliminary result)   Collection Time:  03/23/19  5:00 PM  Result Value Ref Range Status   Specimen Description   Final    BLOOD RIGHT FOREARM Performed at [facility], [address] [place], [place] [address]    Special Requests   Final    BOTTLES DRAWN AEROBIC AND ANAEROBIC Blood Culture adequate volume Performed at [facility], [address] [place], [place] [address]    Culture  Setup Time   Final    GRAM POSITIVE COCCI AEROBIC BOTTLE ONLY Organism ID to follow    Culture   Final    NO GROWTH < 12 HOURS Performed at [facility], [place] [address] [place], [place] [address]    Report Status PENDING  Incomplete  SARS Coronavirus 2 (Hosp order,Performed in [facility] lab via [employer] ID)     Status: None   Collection Time: 03/23/19  5:41 PM  Result Value Ref Range Status   SARS Coronavirus 2 ([employer] ID Now) NEGATIVE NEGATIVE Final    Comment: (NOTE) Interpretive Result Comment(s): COVID 19 Positive SARS CoV 2 target nucleic acids are DETECTED. The SARS CoV 2 RNA is generally detectable in upper and lower respiratory specimens during the acute phase of infection.  Positive results are indicative of active infection with SARS CoV 2.  Clinical correlation with patient history and other diagnostic information is necessary to determine patient infection status.  Positive results do not rule out bacterial infection or coinfection with other viruses. The expected result is Negative. COVID 19 Negative SARS CoV 2 target nucleic acids are NOT DETECTED. The SARS CoV 2 RNA is generally detectable in upper and lower respiratory specimens during the acute phase of infection.  Negative results do not preclude SARS CoV 2 infection, do not rule out coinfections with other pathogens, and should not be used as the sole basis for treatment or other patient management decisions.  Negative results must be combined with clinical  observations, patient history, and epidemiological information. The  expected result is Negative. Invalid Presence or absence of SARS CoV 2 nucleic acids cannot be determined. Repeat testing was performed on the submitted specimen and repeated Invalid results were obtained.  If clinically indicated, additional testing on a new specimen with an alternate test methodology [phone]([id number]) is advised.  The SARS CoV 2 RNA is generally detectable in upper and lower respiratory specimens during the acute phase of infection. The expected result is Negative. Fact Sheet for Patients:  http://www.graves-ford.org/https://www.fda.gov/media/136524/download Fact Sheet for Healthcare Providers: EnviroConcern.sihttps://www.fda.gov/media/136523/download This test is not yet approved or cleared by the [place] FDA and has been authorized for detection and/or diagnosis of SARS CoV 2 by FDA under an Emergency Use Authorization (EUA).  This EUA will remain in effect (meaning this test can be used) for the duration of the COVID19 d eclaration under Section 564(b)(1) of the Act, 21 U.S.C. section [id number] 3(b)(1), unless the authorization is terminated or revoked sooner. Performed at [facility] [place], [address] [place], [place] [address]   SARS Coronavirus 2     Status: None   Collection Time: 03/24/19 12:55 AM  Result Value Ref Range Status   SARS Coronavirus 2 NOT DETECTED NOT DETECTED Final    Comment: (NOTE) SARS-CoV-2 target nucleic acids are NOT DETECTED. The SARS-CoV-2 RNA is generally detectable in upper and lower respiratory specimens during the acute phase of infection.  Negative  results do not preclude SARS-CoV-2 infection, do not rule out co-infections with other pathogens, and should not be used as the sole basis for treatment or other patient management decisions.  Negative results must be combined with clinical observations, patient history, and epidemiological information. The expected result is Not Detected. Fact Sheet for  Patients: http://www.biofiredefense.com/wp-content/uploads/2020/03/BIOFIRE-COVID -19-patients.pdf Fact Sheet for Healthcare Providers: http://www.biofiredefense.com/wp-content/uploads/2020/03/BIOFIRE-COVID -19-hcp.pdf This test is not yet approved or cleared by the [place] FDA and  has been authorized for detection and/or diagnosis of SARS-CoV-2 by FDA under an Emergency Use Authorization (EUA).  This EUA will remain in effec t (meaning this test can be used) for the duration of  the COVID-19 declaration under Section 564(b)(1) of the Act, 21 U.S.C. section 360bbb-3(b)(1), unless the authorization is terminated or revoked sooner. Performed at [facility], [address] [place], [place] [address]          Radiology Studies: Ct Angio Chest Pe W And/or Wo Contrast  Result Date: 03/23/2019 CLINICAL DATA:  Chest pain and cough. Elevated D-dimer. Abdominal pain with elevated lipase EXAM: CT ANGIOGRAPHY CHEST CT ABDOMEN AND PELVIS WITH CONTRAST TECHNIQUE: Multidetector CT imaging of the chest was performed using the standard protocol during bolus administration of intravenous contrast. Multiplanar CT image reconstructions and MIPs were obtained to evaluate the vascular anatomy. Multidetector CT imaging of the abdomen and pelvis was performed using the standard protocol during bolus administration of intravenous contrast. CONTRAST:  100mL OMNIPAQUE IOHEXOL 350 MG/ML SOLN COMPARISON:  Chest radiograph March 23, 2019 FINDINGS: CTA CHEST FINDINGS Cardiovascular: There is no demonstrable pulmonary embolus. There is no thoracic aortic aneurysm or dissection. There is tortuosity in the descending thoracic aorta. The visualized great vessels appear unremarkable. There is no pericardial effusion or pericardial thickening. Mediastinum/Nodes: Thyroid appears normal. There are subcentimeter mediastinal lymph nodes. There is no adenopathy by size criteria in the thorax. No esophageal lesions are  demonstrable. Lungs/Pleura: There is underlying centrilobular emphysematous change. No edema or consolidation. No pleural effusion or pleural thickening evident. Musculoskeletal: There are no blastic or lytic bone lesions. There is degenerative change in the thoracic spine. There are no evident chest wall lesions. Review of the MIP images confirms the above findings. CT ABDOMEN and PELVIS FINDINGS Hepatobiliary: There is hepatic steatosis. No focal liver lesions are appreciable. There is apparent sludge in the gallbladder. There may be superimposed small gallstones within the gallbladder. The gallbladder wall does not appear appreciably thickened. There is no biliary duct dilatation. Pancreas: In the tail of the pancreas, there is a small cystic area containing a focus of calcification measuring 1.7 x 0.8 cm. Elsewhere, there is no pancreatic mass or pancreatic duct dilatation. There is no peripancreatic fluid or pancreatic enlargement. Pancreatic enhancement is homogeneous throughout. Spleen: No splenic lesions are evident. Adrenals/Urinary Tract: Adrenals bilaterally appear normal. Kidneys bilaterally show no evident mass or hydronephrosis on either side. There is no appreciable renal or ureteral calculus on either side. Urinary bladder is midline with wall thickness within normal limits. Stomach/Bowel: There is no appreciable bowel wall or mesenteric thickening. No evident bowel obstruction. Terminal ileum appears unremarkable. There is no appreciable free air or portal venous air. Vascular/Lymphatic: There is no abdominal aortic aneurysm. There is aortic and iliac artery atherosclerosis. There is no evident adenopathy in the abdomen or pelvis. Reproductive: There are a few tiny prostatic calculi. Prostate and seminal vesicles are normal in size and contour. There is no evident pelvic mass. Other: Appendix appears normal. There is no appreciable abscess or ascites in the abdomen or pelvis. Musculoskeletal: There  is degenerative change in the lumbar spine. There are no blastic or lytic bone lesions. No abdominal wall lesions evident. Review of the MIP images confirms the above findings. IMPRESSION: CT angiogram chest: 1. No demonstrable pulmonary embolus. No thoracic aortic aneurysm or dissection. 2. Underlying centrilobular emphysematous change. No edema or consolidation. 3.  No demonstrable thoracic adenopathy. CT abdomen and pelvis: 1. There is a 1.7 x 0.8 cm cystic area in the tail of the pancreas containing a focus of calcification. Questions small cystic neoplasm versus residua of inflammation. Pancreas otherwise appears normal. That this lesion may warrant nonemergent pancreatic MR pre and post-contrast to further evaluate. 2. Sludge in gallbladder. There may be small intermingled gallstones. 3. No evident bowel obstruction. No abscess in the abdomen or pelvis.  Appendix appears normal. 4. No evident renal or ureteral calculi. No hydronephrosis. Urinary bladder wall thickness normal. 5.  There is hepatic steatosis. 6.  There is aortoiliac atherosclerosis. Electronically Signed   By: Bretta BangWilliam  Woodruff III M.D.   On: 03/23/2019 18:57   Mr Abdomen W Wo Contrast  Result Date: 03/24/2019 CLINICAL DATA:  Pancreatic tail mass on abdominal CT EXAM: MRI ABDOMEN WITHOUT AND WITH CONTRAST TECHNIQUE: Multiplanar multisequence MR imaging of the abdomen was performed both before and after the administration of intravenous contrast. CONTRAST:  8 cc Gadavist COMPARISON:  03/23/2019 CT FINDINGS: Lower chest: Normal heart size without pericardial or pleural effusion. Hepatobiliary: Moderate hepatic steatosis. Gallstones on the order of 6 mm. Possible mild gallbladder wall thickening at 5 mm on 28/8. No pericholecystic fluid. No biliary duct dilatation. Pancreas: Corresponding to the CT abnormality, within the pancreatic tail, is a 1.4 cm T2 hyperintense, T1 hypointense lesion without significant postcontrast enhancement.  Calcification, as evidenced by increased signal on long TE weighted imaging. No main duct dilatation or communication with the main duct. No peripancreatic edema. Spleen:  Normal in size, without focal abnormality. Adrenals/Urinary Tract: Normal adrenal glands. Normal kidneys, without hydronephrosis. Stomach/Bowel: Normal stomach and abdominal bowel loops. Vascular/Lymphatic: Normal caliber of the aorta and branch vessels. No retroperitoneal or retrocrural adenopathy. Other:  No ascites. Musculoskeletal: No acute osseous abnormality. IMPRESSION: 1. Cystic lesion within the pancreatic tail, favored to represent a pseudocyst. Indolent cystic neoplasm could look similar. Per consensus criteria, this warrants follow-up with pre and post-contrast MRI/MRCP at 1 year. This recommendation follows ACR consensus guidelines: Management of Incidental Pancreatic Cysts: A White Paper of the ACR Incidental Findings Committee. J Am Coll Radiol 2017;14:911-923. 2. Cholelithiasis with suspicion of mild gallbladder wall thickening. No specific evidence of acute cholecystitis. 3. Hepatic steatosis. Electronically Signed   By: Jeronimo GreavesKyle  Talbot M.D.   On: 03/24/2019 12:00   Ct Abdomen Pelvis W Contrast  Result Date: 03/23/2019 : CT angiogram chest as well as CT abdomen and pelvis reports are combined into a single dictation. Electronically Signed   By: Bretta BangWilliam  Woodruff III M.D.   On: 03/23/2019 19:01   Mr 3d Recon At Scanner  Result Date: 03/24/2019 CLINICAL DATA:  Pancreatic tail mass on abdominal CT EXAM: MRI ABDOMEN WITHOUT AND WITH CONTRAST TECHNIQUE: Multiplanar multisequence MR imaging of the abdomen was performed both before and after the administration of intravenous contrast. CONTRAST:  8 cc Gadavist COMPARISON:  03/23/2019 CT FINDINGS: Lower chest: Normal heart size without pericardial or pleural effusion. Hepatobiliary: Moderate hepatic steatosis. Gallstones on the order of 6 mm. Possible mild gallbladder wall thickening at  5 mm on 28/8. No pericholecystic fluid. No biliary duct dilatation. Pancreas: Corresponding to the CT abnormality, within the pancreatic tail, is a 1.4 cm T2 hyperintense, T1 hypointense lesion without significant postcontrast enhancement. Calcification, as evidenced by increased signal on long TE weighted imaging. No main duct dilatation or communication with the main duct. No peripancreatic edema. Spleen:  Normal in size, without focal abnormality. Adrenals/Urinary Tract: Normal adrenal glands. Normal kidneys, without hydronephrosis. Stomach/Bowel: Normal stomach and abdominal bowel loops. Vascular/Lymphatic: Normal caliber of the aorta and branch vessels. No retroperitoneal or retrocrural adenopathy. Other:  No ascites. Musculoskeletal: No acute osseous abnormality. IMPRESSION: 1. Cystic lesion within the pancreatic tail, favored to represent a pseudocyst. Indolent cystic neoplasm could look similar. Per consensus criteria, this warrants follow-up with pre and post-contrast MRI/MRCP at 1 year. This recommendation follows ACR consensus guidelines: Management of Incidental Pancreatic Cysts:  A White Paper of the ACR Incidental Findings Committee. J Am Coll Radiol 2017;14:911-923. 2. Cholelithiasis with suspicion of mild gallbladder wall thickening. No specific evidence of acute cholecystitis. 3. Hepatic steatosis. Electronically Signed   By: Jeronimo Greaves M.D.   On: 03/24/2019 12:00   Dg Chest Portable 1 View  Result Date: 03/23/2019 CLINICAL DATA:  Generalized body aches. Headache. Chest pain. Shortness of breath. Cough. EXAM: PORTABLE CHEST 1 VIEW COMPARISON:  No prior. FINDINGS: Mediastinum and hilar structures normal. Lungs are clear. No pleural effusion or pneumothorax. Heart size normal. Degenerative change thoracic spine. IMPRESSION: No acute cardiopulmonary disease. Electronically Signed   By: Maisie Fus  Register   On: 03/23/2019 16:25        Scheduled Meds:  folic acid  1 mg Oral Daily   heparin   5,000 Units Subcutaneous Q8H   multivitamin with minerals  1 tablet Oral Daily   potassium chloride  40 mEq Oral BID   thiamine  100 mg Oral Daily   Continuous Infusions:  ceFEPime (MAXIPIME) IV 2 g (03/24/19 1025)   dextrose 5 % and 0.9% NaCl 100 mL/hr at 03/24/19 0600   magnesium sulfate bolus IVPB     potassium chloride 10 mEq (03/24/19 1151)   vancomycin 750 mg (03/24/19 1127)     LOS: 0 days    Time spent: 35 minutes.     Kathlen Mody, MD [facility] Pager [phone]   If 7PM-7AM, please contact night-coverage [email or web address] Password [email or web address] 03/24/2019, 12:26 PM

## 2019-03-24 NOTE — Progress Notes (Signed)
PHARMACY - PHYSICIAN COMMUNICATION CRITICAL VALUE ALERT - BLOOD CULTURE IDENTIFICATION (BCID)  Tom Bender is an 58 y.o. male who presented to [facility] on 03/23/2019 with a chief complaint of generalized weakness, body aches, and abdominal pain. Patient was started on vancomycin and cefepime for SIRS.  Assessment: BCID panel revealed 2/4 bottles (different sets) growing methicillin resistant coagulase negative staph. Patient is afebrile and WBC is wnl. Source unclear.  Name of physician (or Provider) Contacted: Dr. Karleen Hampshire  Current antibiotics: Vancomycin 750 mg IV q8h and Cefepime 2 g IV q8h. Patient also received one dose of Flagyl in the ED.  Changes to prescribed antibiotics recommended:  -Continue Vancomycin 750 mg IV q8h -Discontinue cefepime 2 g IV q8h Recommendations accepted by provider  Results for orders placed or performed during the hospital encounter of 03/23/19  Blood Culture ID Panel (Reflexed) (Collected: 03/23/2019  5:00 PM)  Result Value Ref Range   Enterococcus species NOT DETECTED NOT DETECTED   Listeria monocytogenes NOT DETECTED NOT DETECTED   Staphylococcus species DETECTED (A) NOT DETECTED   Staphylococcus aureus (BCID) NOT DETECTED NOT DETECTED   Methicillin resistance DETECTED (A) NOT DETECTED   Streptococcus species NOT DETECTED NOT DETECTED   Streptococcus agalactiae NOT DETECTED NOT DETECTED   Streptococcus pneumoniae NOT DETECTED NOT DETECTED   Streptococcus pyogenes NOT DETECTED NOT DETECTED   Acinetobacter baumannii NOT DETECTED NOT DETECTED   Enterobacteriaceae species NOT DETECTED NOT DETECTED   Enterobacter cloacae complex NOT DETECTED NOT DETECTED   Escherichia coli NOT DETECTED NOT DETECTED   Klebsiella oxytoca NOT DETECTED NOT DETECTED   Klebsiella pneumoniae NOT DETECTED NOT DETECTED   Proteus species NOT DETECTED NOT DETECTED   Serratia marcescens NOT DETECTED NOT DETECTED   Haemophilus influenzae NOT DETECTED NOT DETECTED   Neisseria  meningitidis NOT DETECTED NOT DETECTED   Pseudomonas aeruginosa NOT DETECTED NOT DETECTED   Candida albicans NOT DETECTED NOT DETECTED   Candida glabrata NOT DETECTED NOT DETECTED   Candida krusei NOT DETECTED NOT DETECTED   Candida parapsilosis NOT DETECTED NOT DETECTED   Candida tropicalis NOT DETECTED NOT DETECTED    Jackson Latino, PharmD PGY1 Pharmacy Resident Phone [phone] 03/24/2019     2:31 PM

## 2019-03-24 NOTE — Plan of Care (Signed)

## 2019-03-25 ENCOUNTER — Inpatient Hospital Stay (HOSPITAL_COMMUNITY): Payer: Self-pay

## 2019-03-25 DIAGNOSIS — R7881 Bacteremia: Secondary | ICD-10-CM

## 2019-03-25 LAB — MAGNESIUM: Magnesium: 1.5 mg/dL — ABNORMAL LOW (ref 1.7–2.4)

## 2019-03-25 LAB — BASIC METABOLIC PANEL
Anion gap: 9 (ref 5–15)
BUN: <5 mg/dL — ABNORMAL LOW (ref 6–20)
CO2: 27 mmol/L (ref 22–32)
Calcium: 7.7 mg/dL — ABNORMAL LOW (ref 8.9–10.3)
Chloride: 98 mmol/L (ref 98–111)
Creatinine, Ser: 0.63 mg/dL (ref 0.61–1.24)
GFR calc Af Amer: >60 mL/min (ref >60)
GFR calc non Af Amer: >60 mL/min (ref >60)
Glucose, Bld: 117 mg/dL — ABNORMAL HIGH (ref 70–99)
Potassium: 3.1 mmol/L — ABNORMAL LOW (ref 3.5–5.1)
Sodium: 134 mmol/L — ABNORMAL LOW (ref 135–145)

## 2019-03-25 LAB — PHOSPHORUS: Phosphorus: 2.3 mg/dL — ABNORMAL LOW (ref 2.5–4.6)

## 2019-03-25 LAB — URINE CULTURE: Culture: NO GROWTH

## 2019-03-25 LAB — ECHOCARDIOGRAM COMPLETE
Height: 69 in
Weight: 2895.96 oz

## 2019-03-25 LAB — HIV ANTIBODY (ROUTINE TESTING W REFLEX): HIV Screen 4th Generation wRfx: NONREACTIVE

## 2019-03-25 MED ORDER — POTASSIUM CHLORIDE CRYS ER 20 MEQ PO TBCR
40.0000 meq | EXTENDED_RELEASE_TABLET | Freq: Two times a day (BID) | ORAL | Status: AC
  Administered 2019-03-25 (×2): 40 meq via ORAL
  Filled 2019-03-25 (×2): qty 2

## 2019-03-25 MED ORDER — MAGNESIUM SULFATE 50 % IJ SOLN
3.0000 g | Freq: Once | INTRAVENOUS | Status: AC
  Administered 2019-03-25: 3 g via INTRAVENOUS
  Filled 2019-03-25: qty 6

## 2019-03-25 NOTE — Progress Notes (Signed)
PROGRESS NOTE    Tom BenneBanks Maxim  RKY:[id number] DOB: January 16, 1961 DOA: 03/23/2019 PCP: Patient, No Pcp Per    Brief Narrative: Tom Bender is a 58 y.o. male with medical history significant of alcohol abuse, tobacco abuse, chronic kidney disease stage II, hypertension, presenting with progressive weakness body aches headaches chest pain and shortness of breath over the last 1 to 2 weeks.  Patient also reported some dizziness and falls.  Also abdominal pain in the epigastric region which is rated as 8 out of 10.  It starts from the epigastric region and goes downwards towards the umbilicus. He was admitted for evaluation of SIRS.    Assessment & Plan:   Principal Problem:   SIRS (systemic inflammatory response syndrome) (HCC) Active Problems:   Hyponatremia   Hypokalemia   Benign essential HTN   Alcohol abuse   Lactic acidosis   Pancreatic cyst  SIRS:  Staph bacteremia vs Acute cholecystitis vs alcoholic hepatitis vs dehydration. Blood cultures show methicillin resistance staph 2/4, sensitivities are pending . No respiratory symptoms. UA is negative. Pt reports some epigastric pain. Lipase levels wnl.  Lactic acid normalized. Gently hydrate with IV fluids.  MRI of the abdomen shows Cystic lesion within the pancreatic tail, favored to represent a pseudocyst. Indolent cystic neoplasm could look similar. Per onsensus criteria, this warrants follow-up with pre and post-contrast MRI/MRCP at 1 year. Cholelithiasis with suspicion of mild gallbladder wall thickening. No specific evidence of acute cholecystitis.  He is afebrile and wbc count is wnl.  He is on IV vancomycin.  Repeat cultures are pending.    Hypokalemia and hypomagnesemia:  Replaced.   Essential hypertension;  Pt reports he was on metoprolol , but ran out of the medication.  Normotensive at this time.    Alcohol abuse;  On CIWA protocol.  Pt is having some withdrawal symptoms, tachycardic, tremulous, having tremors in his  hands.   Hyponatremia: resolved with hydration.    Dehydration:  Continue with IV fluids for another 24 hours.      DVT prophylaxis: heparin  Code Status: full code.  Family Communication: none at bedside.  Disposition Plan: pending clinical improvement.   Consultants:   ID on the phone.    Procedures: MRI of the abdomen.   Antimicrobials: vancomycin and cefepime.   Subjective: Continues to be weak. Having tremors and tremulous.   Objective: Vitals:   03/24/19 2106 03/25/19 0509 03/25/19 0800 03/25/19 1200  BP: 107/78 110/79 (!) 164/132 108/78  Pulse: 74 77 (!) 105 (!) 104  Resp: (!) 23 17  (!) 23  Temp: 98.3 F (36.8 C) 98.8 F (37.1 C)    TempSrc: Oral Oral    SpO2: 98% 94% 98%   Weight:  82.1 kg    Height:        Intake/Output Summary (Last 24 hours) at 03/25/2019 1312 Last data filed at 03/25/2019 0900 Gross per 24 hour  Intake 2899.15 ml  Output 1910 ml  Net 989.15 ml   Filed Weights   03/23/19 1538 03/23/19 2158 03/25/19 0509  Weight: 72.6 kg 76.7 kg 82.1 kg    Examination:  General exam: Appears mild distress from alcohol withdrawal symptoms.  Respiratory system: Clear to auscultation. Respiratory effort normal.no wheezing or rhonchi.  Cardiovascular system: S1 & S2 heard, tachycardic.  No JVD, . Gastrointestinal system: Abdomen is soft, non tender non distended. Bowel sounds.  Central nervous system: Alert and oriented. No focal neurological deficits. Extremities: Symmetric 5 x 5 power. Skin: No rashes, lesions  or ulcers Psychiatry:  Mood & affect appropriate.     Data Reviewed: I have personally reviewed following labs and imaging studies  CBC: Recent Labs  Lab 03/23/19 1606 03/24/19 0021  WBC 6.4 5.9  NEUTROABS 3.8  --   HGB 14.9 13.1  HCT 42.5 37.3*  MCV 104.7* 103.9*  PLT 137* 112*   Basic Metabolic Panel: Recent Labs  Lab 03/23/19 1606 03/24/19 0021 03/24/19 0950 03/25/19 1124  NA 133* 136  --  134*  K 3.4* 2.9* 2.7*  3.1*  CL 88* 97*  --  98  CO2 33* 28  --  27  GLUCOSE 126* 122*  --  117*  BUN <5* <5*  --  <5*  CREATININE 0.82 0.73  --  0.63  CALCIUM 8.5* 7.8*  --  7.7*  MG  --   --  1.4* 1.5*  PHOS  --   --   --  2.3*   GFR: Estimated Creatinine Clearance: 104.3 mL/min (by C-G formula based on SCr of 0.63 mg/dL). Liver Function Tests: Recent Labs  Lab 03/23/19 1606 03/24/19 0021  AST 170* 126*  ALT 59* 48*  ALKPHOS 166* 140*  BILITOT 3.8* 3.3*  PROT 6.8 5.9*  ALBUMIN 3.1* 2.5*   Recent Labs  Lab 03/24/19 0950  LIPASE 35   No results for input(s): AMMONIA in the last 168 hours. Coagulation Profile: No results for input(s): INR, PROTIME in the last 168 hours. Cardiac Enzymes: Recent Labs  Lab 03/23/19 1606  TROPONINI <0.03   BNP (last 3 results) No results for input(s): PROBNP in the last 8760 hours. HbA1C: No results for input(s): HGBA1C in the last 72 hours. CBG: No results for input(s): GLUCAP in the last 168 hours. Lipid Profile: No results for input(s): CHOL, HDL, LDLCALC, TRIG, CHOLHDL, LDLDIRECT in the last 72 hours. Thyroid Function Tests: No results for input(s): TSH, T4TOTAL, FREET4, T3FREE, THYROIDAB in the last 72 hours. Anemia Panel: No results for input(s): VITAMINB12, FOLATE, FERRITIN, TIBC, IRON, RETICCTPCT in the last 72 hours. Sepsis Labs: Recent Labs  Lab 03/23/19 1606 03/23/19 1745 03/24/19 0950  LATICACIDVEN 4.7* 3.8* 1.7    Recent Results (from the past 240 hour(s))  Blood Culture (routine x 2)     Status: Abnormal (Preliminary result)   Collection Time: 03/23/19  4:00 PM  Result Value Ref Range Status   Specimen Description   Final    BLOOD RIGHT ANTECUBITAL Performed at [facility], [address], [place], [place] [address]    Special Requests   Final    BOTTLES DRAWN AEROBIC AND ANAEROBIC Blood Culture adequate volume Performed at [facility], [address] [place], [place] [address]    Culture  Setup Time    Final    GRAM POSITIVE COCCI AEROBIC BOTTLE ONLY CRITICAL VALUE NOTED.  VALUE IS CONSISTENT WITH PREVIOUSLY REPORTED AND CALLED VALUE. Performed at [facility], [address] [place] [address], [place], [place] [address]    Culture STAPHYLOCOCCUS SPECIES (COAGULASE NEGATIVE) (A)  Final   Report Status PENDING  Incomplete  Urine culture     Status: None   Collection Time: 03/23/19  4:06 PM  Result Value Ref Range Status   Specimen Description   Final    URINE, CLEAN CATCH Performed at [facility], [address] [place], [place] [address]    Special Requests   Final    NONE Performed at [facility], [address]  [place], [place] [address]    Culture   Final    NO GROWTH Performed at [facility], [address] [place], [place] [address]    Report Status 03/25/2019 FINAL  Final  Blood Culture (routine x 2)     Status: Abnormal (Preliminary result)   Collection Time: 03/23/19  5:00 PM  Result Value Ref Range Status   Specimen Description   Final    BLOOD RIGHT FOREARM Performed at [facility] [place], [address] [place], [place] [address]    Special Requests   Final    BOTTLES DRAWN AEROBIC AND ANAEROBIC Blood Culture adequate volume Performed at [facility] [place], [address] [place], [place] [address]    Culture  Setup Time   Final    GRAM POSITIVE COCCI AEROBIC BOTTLE ONLY CRITICAL RESULT CALLED TO, READ BACK BY AND VERIFIED WITH: C WHITESIDE AT 1400 ON [id number] BY SJW    Culture (A)  Final    STAPHYLOCOCCUS SPECIES (COAGULASE NEGATIVE) SUSCEPTIBILITIES TO FOLLOW Performed at [facility], [address] [place], [place] [address]    Report Status PENDING  Incomplete  Blood Culture ID Panel (Reflexed)     Status: Abnormal   Collection Time: 03/23/19  5:00 PM  Result Value Ref Range Status   Enterococcus species NOT DETECTED NOT DETECTED Final   Listeria monocytogenes NOT DETECTED NOT DETECTED Final   Staphylococcus  species DETECTED (A) NOT DETECTED Final    Comment: Methicillin (oxacillin) resistant coagulase negative staphylococcus. Possible blood culture contaminant (unless isolated from more than one blood culture draw or clinical case suggests pathogenicity). No antibiotic treatment is indicated for blood  culture contaminants. CRITICAL RESULT CALLED TO, READ BACK BY AND VERIFIED WITH: WHITESIDE,C AT 1400 ON [id number] BY SJW    Staphylococcus aureus (BCID) NOT DETECTED NOT DETECTED Final   Methicillin resistance DETECTED (A) NOT DETECTED Final    Comment: CRITICAL RESULT CALLED TO, READ BACK BY AND VERIFIED WITH: WHITESIDE, C PHARMD AT 1400 ON [id number] BY SJW    Streptococcus species NOT DETECTED NOT DETECTED Final   Streptococcus agalactiae NOT DETECTED NOT DETECTED Final   Streptococcus pneumoniae NOT DETECTED NOT DETECTED Final   Streptococcus pyogenes NOT DETECTED NOT DETECTED Final   Acinetobacter baumannii NOT DETECTED NOT DETECTED Final   Enterobacteriaceae species NOT DETECTED NOT DETECTED Final   Enterobacter cloacae complex NOT DETECTED NOT DETECTED Final   Escherichia coli NOT DETECTED NOT DETECTED Final   Klebsiella oxytoca NOT DETECTED NOT DETECTED Final   Klebsiella pneumoniae NOT DETECTED NOT DETECTED Final   Proteus species NOT DETECTED NOT DETECTED Final   Serratia marcescens NOT DETECTED NOT DETECTED Final   Haemophilus influenzae NOT DETECTED NOT DETECTED Final   Neisseria meningitidis NOT DETECTED NOT DETECTED Final   Pseudomonas aeruginosa NOT DETECTED NOT DETECTED Final   Candida albicans NOT DETECTED NOT DETECTED Final   Candida glabrata NOT DETECTED NOT DETECTED Final   Candida krusei NOT DETECTED NOT DETECTED Final   Candida parapsilosis NOT DETECTED NOT DETECTED Final   Candida tropicalis NOT DETECTED NOT DETECTED Final    Comment: Performed at [facility], [address] [place], [place] [address]  SARS Coronavirus 2 (Hosp order,Performed in [facility] lab via  [facility] ID)     Status: None   Collection Time: 03/23/19  5:41 PM  Result Value Ref Range Status   SARS Coronavirus 2 ([facility]ott ID Now) NEGATIVE NEGATIVE Final  Comment: (NOTE) Interpretive Result Comment(s): COVID 19 Positive SARS CoV 2 target nucleic acids are DETECTED. The SARS CoV 2 RNA is generally detectable in upper and lower respiratory specimens during the acute phase of infection.  Positive results are indicative of active infection with SARS CoV 2.  Clinical correlation with patient history and other diagnostic information is necessary to determine patient infection status.  Positive results do not rule out bacterial infection or coinfection with other viruses. The expected result is Negative. COVID 19 Negative SARS CoV 2 target nucleic acids are NOT DETECTED. The SARS CoV 2 RNA is generally detectable in upper and lower respiratory specimens during the acute phase of infection.  Negative results do not preclude SARS CoV 2 infection, do not rule out coinfections with other pathogens, and should not be used as the sole basis for treatment or other patient management decisions.  Negative results must be combined with clinical  observations, patient history, and epidemiological information. The expected result is Negative. Invalid Presence or absence of SARS CoV 2 nucleic acids cannot be determined. Repeat testing was performed on the submitted specimen and repeated Invalid results were obtained.  If clinically indicated, additional testing on a new specimen with an alternate test methodology [phone]) is advised.  The SARS CoV 2 RNA is generally detectable in upper and lower respiratory specimens during the acute phase of infection. The expected result is Negative. Fact Sheet for Patients:  GolfingFamily.no Fact Sheet for Healthcare Providers: https://www.hernandez-brewer.com/ This test is not yet approved or cleared by the [place]  FDA and has been authorized for detection and/or diagnosis of SARS CoV 2 by FDA under an Emergency Use Authorization (EUA).  This EUA will remain in effect (meaning this test can be used) for the duration of the COVID19 d eclaration under Section 564(b)(1) of the Act, 21 U.S.C. section [phone] 3(b)(1), unless the authorization is terminated or revoked sooner. Performed at [facility], [address] [place], [place] [address]   SARS Coronavirus 2     Status: None   Collection Time: 03/24/19 12:55 AM  Result Value Ref Range Status   SARS Coronavirus 2 NOT DETECTED NOT DETECTED Final    Comment: (NOTE) SARS-CoV-2 target nucleic acids are NOT DETECTED. The SARS-CoV-2 RNA is generally detectable in upper and lower respiratory specimens during the acute phase of infection.  Negative  results do not preclude SARS-CoV-2 infection, do not rule out co-infections with other pathogens, and should not be used as the sole basis for treatment or other patient management decisions.  Negative results must be combined with clinical observations, patient history, and epidemiological information. The expected result is Not Detected. Fact Sheet for Patients: http://www.biofiredefense.com/wp-content/uploads/2020/03/BIOFIRE-COVID -19-patients.pdf Fact Sheet for Healthcare Providers: http://www.biofiredefense.com/wp-content/uploads/2020/03/BIOFIRE-COVID -19-hcp.pdf This test is not yet approved or cleared by the [place] and  has been authorized for detection and/or diagnosis of SARS-CoV-2 by FDA under an Emergency Use Authorization (EUA).  This EUA will remain in effec t (meaning this test can be used) for the duration of  the COVID-19 declaration under Section 564(b)(1) of the Act, 21 U.S.C. section 360bbb-3(b)(1), unless the authorization is terminated or revoked sooner. Performed at [facility], [place] [address], [place], [place] [address]          Radiology  Studies: Ct Angio Chest Pe W And/or Wo Contrast  Result Date: 03/23/2019 CLINICAL DATA:  Chest pain and cough. Elevated D-dimer. Abdominal pain with elevated lipase EXAM: CT ANGIOGRAPHY CHEST CT ABDOMEN  AND PELVIS WITH CONTRAST TECHNIQUE: Multidetector CT imaging of the chest was performed using the standard protocol during bolus administration of intravenous contrast. Multiplanar CT image reconstructions and MIPs were obtained to evaluate the vascular anatomy. Multidetector CT imaging of the abdomen and pelvis was performed using the standard protocol during bolus administration of intravenous contrast. CONTRA :  100mL OMNIPAQUE IOHEXOL 350 MG/ML SOLN COMPARISON:  Chest radiograph March 23, 2019 FINDINGS: CTA CHEST FINDINGS Cardiovascular: There is no demonstrable pulmonary embolus. There is no thoracic aortic aneurysm or dissection. There is tortuosity in the descending thoracic aorta. The visualized great vessels appear unremarkable. There is no pericardial effusion or pericardial thickening. Mediastinum/Nodes: Thyroid appears normal. There are subcentimeter mediastinal lymph nodes. There is no adenopathy by size criteria in the thorax. No esophageal lesions are demonstrable. Lungs/Pleura: There is underlying centrilobular emphysematous change. No edema or consolidation. No pleural effusion or pleural thickening evident. Musculoskeletal: There are no blastic or lytic bone lesions. There is degenerative change in the thoracic spine. There are no evident chest wall lesions. Review of the MIP images confirms the above findings. CT ABDOMEN and PELVIS FINDINGS Hepatobiliary: There is hepatic steatosis. No focal liver lesions are appreciable. There is apparent sludge in the gallbladder. There may be superimposed small gallstones within the gallbladder. The gallbladder wall does not appear appreciably thickened. There is no biliary duct dilatation. Pancreas: In the tail of the pancreas, there is a small cystic area  containing a focus of calcification measuring 1.7 x 0.8 cm. Elsewhere, there is no pancreatic mass or pancreatic duct dilatation. There is no peripancreatic fluid or pancreatic enlargement. Pancreatic enhancement is homogeneous throughout. Spleen: No splenic lesions are evident. Adrenals/Urinary Tract: Adrenals bilaterally appear normal. Kidneys bilaterally show no evident mass or hydronephrosis on either side. There is no appreciable renal or ureteral calculus on either side. Urinary bladder is midline with wall thickness within normal limits. Stomach/Bowel: There is no appreciable bowel wall or mesenteric thickening. No evident bowel obstruction. Terminal ileum appears unremarkable. There is no appreciable free air or portal venous air. Vascular/Lymphatic: There is no abdominal aortic aneurysm. There is aortic and iliac artery atherosclerosis. There is no evident adenopathy in the abdomen or pelvis. Reproductive: There are a few tiny prostatic calculi. Prostate and seminal vesicles are normal in size and contour. There is no evident pelvic mass. Other: Appendix appears normal. There is no appreciable abscess or ascites in the abdomen or pelvis. Musculoskeletal: There is degenerative change in the lumbar spine. There are no blastic or lytic bone lesions. No abdominal wall lesions evident. Review of the MIP images confirms the above findings. IMPRESSION: CT angiogram chest: 1. No demonstrable pulmonary embolus. No thoracic aortic aneurysm or dissection. 2. Underlying centrilobular emphysematous change. No edema or consolidation. 3.  No demonstrable thoracic adenopathy. CT abdomen and pelvis: 1. There is a 1.7 x 0.8 cm cystic area in the tail of the pancreas containing a focus of calcification. Questions small cystic neoplasm versus residua of inflammation. Pancreas otherwise appears normal. That this lesion may warrant nonemergent pancreatic MR pre and post-contrast to further evaluate. 2. Sludge in gallbladder.  There may be small intermingled gallstones. 3. No evident bowel obstruction. No abscess in the abdomen or pelvis. Appendix appears normal. 4. No evident renal or ureteral calculi. No hydronephrosis. Urinary bladder wall thickness normal. 5.  There is hepatic steatosis. 6.  There is aortoiliac atherosclerosis. Electronically Signed   By: Bretta Bang III M.D.   On: 03/23/2019 18:57   Mr Abdomen  W Wo Contrast  Result Date: 03/24/2019 CLINICAL DATA:  Pancreatic tail mass on abdominal CT EXAM: MRI ABDOMEN WITHOUT AND WITH CONTRAST TECHNIQUE: Multiplanar multisequence MR imaging of the abdomen was performed both before and after the administration of intravenous contrast. CONTRAST:  8 cc Gadavist COMPARISON:  03/23/2019 CT FINDINGS: Lower chest: Normal heart size without pericardial or pleural effusion. Hepatobiliary: Moderate hepatic steatosis. Gallstones on the order of 6 mm. Possible mild gallbladder wall thickening at 5 mm on 28/8. No pericholecystic fluid. No biliary duct dilatation. Pancreas: Corresponding to the CT abnormality, within the pancreatic tail, is a 1.4 cm T2 hyperintense, T1 hypointense lesion without significant postcontrast enhancement. Calcification, as evidenced by increased signal on long TE weighted imaging. No main duct dilatation or communication with the main duct. No peripancreatic edema. Spleen:  Normal in size, without focal abnormality. Adrenals/Urinary Tract: Normal adrenal glands. Normal kidneys, without hydronephrosis. Stomach/Bowel: Normal stomach and abdominal bowel loops. Vascular/Lymphatic: Normal caliber of the aorta and branch vessels. No retroperitoneal or retrocrural adenopathy. Other:  No ascites. Musculoskeletal: No acute osseous abnormality. IMPRESSION: 1. Cystic lesion within the pancreatic tail, favored to represent a pseudocyst. Indolent cystic neoplasm could look similar. Per consensus criteria, this warrants follow-up with pre and post-contrast MRI/MRCP at 1  year. This recommendation follows ACR consensus guidelines: Management of Incidental Pancreatic Cysts: A White Paper of the ACR Incidental Findings Committee. J Am Coll Radiol 2017;14:911-923. 2. Cholelithiasis with suspicion of mild gallbladder wall thickening. No specific evidence of acute cholecystitis. 3. Hepatic steatosis. Electronically Signed   By: Jeronimo GreavesKyle  Talbot M.D.   On: 03/24/2019 12:00   Ct Abdomen Pelvis W Contrast  Result Date: 03/23/2019 : CT angiogram chest as well as CT abdomen and pelvis reports are combined into a single dictation. Electronically Signed   By: Bretta BangWilliam  Woodruff III M.D.   On: 03/23/2019 19:01   Mr 3d Recon At Scanner  Result Date: 03/24/2019 CLINICAL DATA:  Pancreatic tail mass on abdominal CT EXAM: MRI ABDOMEN WITHOUT AND WITH CONTRAST TECHNIQUE: Multiplanar multisequence MR imaging of the abdomen was performed both before and after the administration of intravenous contrast. CONTRAST:  8 cc Gadavist COMPARISON:  03/23/2019 CT FINDINGS: Lower chest: Normal heart size without pericardial or pleural effusion. Hepatobiliary: Moderate hepatic steatosis. Gallstones on the order of 6 mm. Possible mild gallbladder wall thickening at 5 mm on 28/8. No pericholecystic fluid. No biliary duct dilatation. Pancreas: Corresponding to the CT abnormality, within the pancreatic tail, is a 1.4 cm T2 hyperintense, T1 hypointense lesion without significant postcontrast enhancement. Calcification, as evidenced by increased signal on long TE weighted imaging. No main duct dilatation or communication with the main duct. No peripancreatic edema. Spleen:  Normal in size, without focal abnormality. Adrenals/Urinary Tract: Normal adrenal glands. Normal kidneys, without hydronephrosis. Stomach/Bowel: Normal stomach and abdominal bowel loops. Vascular/Lymphatic: Normal caliber of the aorta and branch vessels. No retroperitoneal or retrocrural adenopathy. Other:  No ascites. Musculoskeletal: No acute  osseous abnormality. IMPRESSION: 1. Cystic lesion within the pancreatic tail, favored to represent a pseudocyst. Indolent cystic neoplasm could look similar. Per consensus criteria, this warrants follow-up with pre and post-contrast MRI/MRCP at 1 year. This recommendation follows ACR consensus guidelines: Management of Incidental Pancreatic Cysts: A White Paper of the ACR Incidental Findings Committee. J Am Coll Radiol 2017;14:911-923. 2. Cholelithiasis with suspicion of mild gallbladder wall thickening. No specific evidence of acute cholecystitis. 3. Hepatic steatosis. Electronically Signed   By: Jeronimo GreavesKyle  Talbot M.D.   On: 03/24/2019 12:00  Dg Chest Portable 1 View  Result Date: 03/23/2019 CLINICAL DATA:  Generalized body aches. Headache. Chest pain. Shortness of breath. Cough. EXAM: PORTABLE CHEST 1 VIEW COMPARISON:  No prior. FINDINGS: Mediastinum and hilar structures normal. Lungs are clear. No pleural effusion or pneumothorax. Heart size normal. Degenerative change thoracic spine. IMPRESSION: No acute cardiopulmonary disease. Electronically Signed   By: Maisie Fus  Register   On: 03/23/2019 16:25        Scheduled Meds:  folic acid  1 mg Oral Daily   heparin  5,000 Units Subcutaneous Q8H   multivitamin with minerals  1 tablet Oral Daily   potassium chloride  40 mEq Oral BID   thiamine  100 mg Oral Daily   Continuous Infusions:  dextrose 5 % and 0.9% NaCl 100 mL/hr at 03/25/19 0300   magnesium sulfate bolus IVPB     vancomycin 750 mg (03/25/19 0820)     LOS: 1 day    Time spent: 32 minutes.     Kathlen Mody, MD [facility] Pager [phone]   If 7PM-7AM, please contact night-coverage [email or web address] Password [email or web address] 03/25/2019, 1:12 PM

## 2019-03-26 ENCOUNTER — Inpatient Hospital Stay (HOSPITAL_COMMUNITY): Payer: Self-pay

## 2019-03-26 LAB — CULTURE, BLOOD (ROUTINE X 2)
Special Requests: ADEQUATE
Special Requests: ADEQUATE

## 2019-03-26 LAB — CBC
HCT: 33.7 % — ABNORMAL LOW (ref 39.0–52.0)
Hemoglobin: 11.6 g/dL — ABNORMAL LOW (ref 13.0–17.0)
MCH: 36.9 pg — ABNORMAL HIGH (ref 26.0–34.0)
MCHC: 34.4 g/dL (ref 30.0–36.0)
MCV: 107.3 fL — ABNORMAL HIGH (ref 80.0–100.0)
Platelets: 94 10*3/uL — ABNORMAL LOW (ref 150–400)
RBC: 3.14 MIL/uL — ABNORMAL LOW (ref 4.22–5.81)
RDW: 14.8 % (ref 11.5–15.5)
WBC: 5.8 10*3/uL (ref 4.0–10.5)
nRBC: 0.3 % — ABNORMAL HIGH (ref 0.0–0.2)

## 2019-03-26 LAB — BASIC METABOLIC PANEL
Anion gap: 6 (ref 5–15)
BUN: <5 mg/dL — ABNORMAL LOW (ref 6–20)
CO2: 25 mmol/L (ref 22–32)
Calcium: 7.7 mg/dL — ABNORMAL LOW (ref 8.9–10.3)
Chloride: 104 mmol/L (ref 98–111)
Creatinine, Ser: 0.56 mg/dL — ABNORMAL LOW (ref 0.61–1.24)
GFR calc Af Amer: >60 mL/min (ref >60)
GFR calc non Af Amer: >60 mL/min (ref >60)
Glucose, Bld: 94 mg/dL (ref 70–99)
Potassium: 3.7 mmol/L (ref 3.5–5.1)
Sodium: 135 mmol/L (ref 135–145)

## 2019-03-26 LAB — MAGNESIUM: Magnesium: 1.8 mg/dL (ref 1.7–2.4)

## 2019-03-26 LAB — HEPATIC FUNCTION PANEL
ALT: 33 U/L (ref 0–44)
AST: 78 U/L — ABNORMAL HIGH (ref 15–41)
Albumin: 2.2 g/dL — ABNORMAL LOW (ref 3.5–5.0)
Alkaline Phosphatase: 110 U/L (ref 38–126)
Bilirubin, Direct: 0.4 mg/dL — ABNORMAL HIGH (ref 0.0–0.2)
Indirect Bilirubin: 0.7 mg/dL (ref 0.3–0.9)
Total Bilirubin: 1.1 mg/dL (ref 0.3–1.2)
Total Protein: 5.4 g/dL — ABNORMAL LOW (ref 6.5–8.1)

## 2019-03-26 LAB — PHOSPHORUS: Phosphorus: 2.8 mg/dL (ref 2.5–4.6)

## 2019-03-26 MED ORDER — ALUM & MAG HYDROXIDE-SIMETH 200-200-20 MG/5ML PO SUSP
30.0000 mL | ORAL | Status: DC | PRN
  Administered 2019-03-26: 09:00:00 30 mL via ORAL
  Filled 2019-03-26: qty 30

## 2019-03-26 MED ORDER — NICOTINE 21 MG/24HR TD PT24
21.0000 mg | MEDICATED_PATCH | Freq: Every day | TRANSDERMAL | Status: DC
  Administered 2019-03-26 – 2019-04-05 (×9): 21 mg via TRANSDERMAL
  Filled 2019-03-26 (×11): qty 1

## 2019-03-26 MED ORDER — PANTOPRAZOLE SODIUM 40 MG PO TBEC
40.0000 mg | DELAYED_RELEASE_TABLET | Freq: Every day | ORAL | Status: DC
  Administered 2019-03-26 – 2019-04-05 (×11): 40 mg via ORAL
  Filled 2019-03-26 (×11): qty 1

## 2019-03-26 MED ORDER — POTASSIUM & SODIUM PHOSPHATES 280-160-250 MG PO PACK
1.0000 | PACK | Freq: Three times a day (TID) | ORAL | Status: DC
  Administered 2019-03-26 – 2019-04-05 (×35): 1 via ORAL
  Filled 2019-03-26 (×45): qty 1

## 2019-03-26 NOTE — Progress Notes (Signed)
PROGRESS NOTE    Tom Bender  EAV:[id number] DOB: 1961-05-06 DOA: 03/23/2019 PCP: Patient, No Pcp Per    Brief Narrative: Tom Bender is a 58 y.o. male with medical history significant of alcohol abuse, tobacco abuse, chronic kidney disease stage II, hypertension, presenting with progressive weakness body aches headaches chest pain and shortness of breath over the last 1 to 2 weeks.  Patient also reported some dizziness and falls.  Also abdominal pain in the epigastric region which is rated as 8 out of 10.  It starts from the epigastric region and goes downwards towards the umbilicus. He was admitted for evaluation of SIRS.    Assessment & Plan:   Principal Problem:   SIRS (systemic inflammatory response syndrome) (HCC) Active Problems:   Hyponatremia   Hypokalemia   Benign essential HTN   Alcohol abuse   Lactic acidosis   Pancreatic cyst  SIRS:  Staph bacteremia vs Acute cholecystitis vs alcoholic hepatitis vs dehydration. Blood cultures show methicillin resistance staph 2/4, sensitivities are pending . No respiratory symptoms. UA is negative. Pt reports some epigastric pain. Lipase levels wnl.  Lactic acid normalized. Gently hydrate with IV fluids.  MRI of the abdomen shows Cystic lesion within the pancreatic tail, favored to represent a pseudocyst. Indolent cystic neoplasm could look similar. Per onsensus criteria, this warrants follow-up with pre and post-contrast MRI/MRCP at 1 year. Cholelithiasis with suspicion of mild gallbladder wall thickening. No specific evidence of acute cholecystitis.  He is afebrile and wbc count is wnl.  He is on IV vancomycin.  Repeat cultures are pending negative so far.  Discussed with Dr Luciana Axeomer , plan to continue the IV antibiotics till the    Hypokalemia and hypomagnesemia:  Replaced.   Essential hypertension;  Pt reports he was on metoprolol , but ran out of the medication.  Normotensive at this time.    Alcohol abuse;  On CIWA protocol.   Pt is having some withdrawal symptoms, tachycardic, tremulous, having tremors in his hands.   Hyponatremia: resolved with hydration.    Dehydration:  Continue with IV fluids for another 24 hours.   Abdominal pain around the Umbilicus radiating to  Right  Upper quadrant today after eating  In view of his gall stone and gb wall thickening, plan for HIDA scan. Discussed with surgery, who will see the patient if needed for acute cholecystitis after the HIDA results.    DVT prophylaxis: heparin  Code Status: full code.  Family Communication: none at bedside.  Disposition Plan: pending clinical improvement.   Consultants:   ID on the phone.   Surgery     Procedures: MRI of the abdomen.   HIDA scheduled for tomorrow.   Antimicrobials: vancomycin  For coag neg staph.   Subjective: Reports abdominal pain in the umbilicus, uncomfortable, feeling weak   Objective: Vitals:   03/25/19 2122 03/26/19 0000 03/26/19 0523 03/26/19 0623  BP: 110/90 (!) 119/98  122/86  Pulse: 73 78 85 73  Resp: 15     Temp: 98.1 F (36.7 C)  98.1 F (36.7 C)   TempSrc: Oral  Oral   SpO2: 97%  97%   Weight:   79.2 kg   Height:        Intake/Output Summary (Last 24 hours) at 03/26/2019 1048 Last data filed at 03/26/2019 0900 Gross per 24 hour  Intake 3359.75 ml  Output 3400 ml  Net -40.25 ml   Filed Weights   03/23/19 2158 03/25/19 0509 03/26/19 0523  Weight: 76.7 kg  82.1 kg 79.2 kg    Examination:  General exam:mild distress from abd pain .  Respiratory system: air entry fair , no wheezing or rhonchi.  Cardiovascular system: S1 & S2 heard, tachycardic.  No JVD, . Gastrointestinal system: Abdomen is soft, tender in the umbilical area, not distended.  Central nervous system: Alert and oriented. No focal neurological deficits. Extremities: Symmetric 5 x 5 power. Skin: No rashes, lesions or ulcers Psychiatry:  Mood & affect appropriate.     Data Reviewed: I have personally reviewed  following labs and imaging studies  CBC: Recent Labs  Lab 03/23/19 1606 03/24/19 0021 03/26/19 0436  WBC 6.4 5.9 5.8  NEUTROABS 3.8  --   --   HGB 14.9 13.1 11.6*  HCT 42.5 37.3* 33.7*  MCV 104.7* 103.9* 107.3*  PLT 137* 112* 94*   Basic Metabolic Panel: Recent Labs  Lab 03/23/19 1606 03/24/19 0021 03/24/19 0950 03/25/19 1124 03/26/19 0436  NA 133* 136  --  134* 135  K 3.4* 2.9* 2.7* 3.1* 3.7  CL 88* 97*  --  98 104  CO2 33* 28  --  27 25  GLUCOSE 126* 122*  --  117* 94  BUN <5* <5*  --  <5* <5*  CREATININE 0.82 0.73  --  0.63 0.56*  CALCIUM 8.5* 7.8*  --  7.7* 7.7*  MG  --   --  1.4* 1.5* 1.8  PHOS  --   --   --  2.3*  --    GFR: Estimated Creatinine Clearance: 104.3 mL/min (A) (by C-G formula based on SCr of 0.56 mg/dL (L)). Liver Function Tests: Recent Labs  Lab 03/23/19 1606 03/24/19 0021  AST 170* 126*  ALT 59* 48*  ALKPHOS 166* 140*  BILITOT 3.8* 3.3*  PROT 6.8 5.9*  ALBUMIN 3.1* 2.5*   Recent Labs  Lab 03/24/19 0950  LIPASE 35   No results for input(s): AMMONIA in the last 168 hours. Coagulation Profile: No results for input(s): INR, PROTIME in the last 168 hours. Cardiac Enzymes: Recent Labs  Lab 03/23/19 1606  TROPONINI <0.03   BNP (last 3 results) No results for input(s): PROBNP in the last 8760 hours. HbA1C: No results for input(s): HGBA1C in the last 72 hours. CBG: No results for input(s): GLUCAP in the last 168 hours. Lipid Profile: No results for input(s): CHOL, HDL, LDLCALC, TRIG, CHOLHDL, LDLDIRECT in the last 72 hours. Thyroid Function Tests: No results for input(s): TSH, T4TOTAL, FREET4, T3FREE, THYROIDAB in the last 72 hours. Anemia Panel: No results for input(s): VITAMINB12, FOLATE, FERRITIN, TIBC, IRON, RETICCTPCT in the last 72 hours. Sepsis Labs: Recent Labs  Lab 03/23/19 1606 03/23/19 1745 03/24/19 0950  LATICACIDVEN 4.7* 3.8* 1.7    Recent Results (from the past 240 hour(s))  Blood Culture (routine x 2)      Status: Abnormal   Collection Time: 03/23/19  4:00 PM  Result Value Ref Range Status   Specimen Description   Final    BLOOD RIGHT ANTECUBITAL Performed at [facility], [address] [place], [place] [address]    Special Requests   Final    BOTTLES DRAWN AEROBIC AND ANAEROBIC Blood Culture adequate volume Performed at [facility] [place], [address] [place], [place] [address]    Culture  Setup Time   Final    GRAM POSITIVE COCCI AEROBIC BOTTLE ONLY CRITICAL VALUE NOTED.  VALUE IS CONSISTENT WITH PREVIOUSLY REPORTED AND CALLED VALUE. GRAM POSITIVE RODS ANAEROBIC BOTTLE ONLY CRITICAL  RESULT CALLED TO, READ BACK BY AND VERIFIED WITH: J. LEDFORD,PHARMD 2257 03/25/2019 T. TYSOR    Culture (A)  Final    STAPHYLOCOCCUS SPECIES (COAGULASE NEGATIVE) SUSCEPTIBILITIES PERFORMED ON PREVIOUS CULTURE WITHIN THE LAST 5 DAYS. BACILLUS SPECIES Standardized susceptibility testing for this organism is not available. Performed at [facility], [place] [address] [place], [place] [address]    Report Status 03/26/2019 FINAL  Final  Urine culture     Status: None   Collection Time: 03/23/19  4:06 PM  Result Value Ref Range Status   Specimen Description   Final    URINE, CLEAN CATCH Performed at [facility], [address] [place], [place] [address]    Special Requests   Final    NONE Performed at [facility], [address] [place], [place] [address]    Culture   Final    NO GROWTH Performed at [facility], [place] [address] [place], [place] [address]    Report Status 03/25/2019 FINAL  Final  Blood Culture (routine x 2)     Status: Abnormal   Collection Time: 03/23/19  5:00 PM  Result Value Ref Range Status   Specimen Description   Final    BLOOD RIGHT FOREARM Performed at [facility], [address] [place], [place] [address]    Special Requests   Final    BOTTLES DRAWN AEROBIC AND ANAEROBIC Blood Culture adequate volume  Performed at [facility], [place] [place], [place] [address]    Culture  Setup Time   Final    GRAM POSITIVE COCCI AEROBIC BOTTLE ONLY CRITICAL RESULT CALLED TO, READ BACK BY AND VERIFIED WITH: C WHITESIDE AT 1400 ON [id number] BY SJW Performed at [facility], [place] [address] [place], [place] [address]    Culture STAPHYLOCOCCUS SPECIES (COAGULASE NEGATIVE) (A)  Final   Report Status 03/26/2019 FINAL  Final   Organism ID, Bacteria STAPHYLOCOCCUS SPECIES (COAGULASE NEGATIVE)  Final      Susceptibility   Staphylococcus species (coagulase negative) - MIC*    CIPROFLOXACIN <=0.5 SENSITIVE Sensitive     ERYTHROMYCIN <=0.25 SENSITIVE Sensitive     GENTAMICIN <=0.5 SENSITIVE Sensitive     OXACILLIN <=0.25 SENSITIVE Sensitive     TETRACYCLINE <=1 SENSITIVE Sensitive     VANCOMYCIN 1 SENSITIVE Sensitive     TRIMETH/SULFA <=10 SENSITIVE Sensitive     CLINDAMYCIN <=0.25 SENSITIVE Sensitive     RIFAMPIN <=0.5 SENSITIVE Sensitive     Inducible Clindamycin NEGATIVE Sensitive     * STAPHYLOCOCCUS SPECIES (COAGULASE NEGATIVE)  Blood Culture ID Panel (Reflexed)     Status: Abnormal   Collection Time: 03/23/19  5:00 PM  Result Value Ref Range Status   Enterococcus species NOT DETECTED NOT DETECTED Final   Listeria monocytogenes NOT DETECTED NOT DETECTED Final   Staphylococcus species DETECTED (A) NOT DETECTED Final    Comment: Methicillin (oxacillin) resistant coagulase negative staphylococcus. Possible blood culture contaminant (unless isolated from more than one blood culture draw or clinical case suggests pathogenicity). No antibiotic treatment is indicated for blood  culture contaminants. CRITICAL RESULT CALLED TO, READ BACK BY AND VERIFIED WITH: WHITESIDE,C AT 1400 ON [id number] BY SJW    Staphylococcus aureus (BCID) NOT DETECTED NOT DETECTED Final   Methicillin resistance DETECTED (A) NOT DETECTED Final    Comment: CRITICAL RESULT CALLED TO, READ BACK BY AND VERIFIED WITH:  WHITESIDE, C PHARMD AT 1400  ON [id number] BY SJW    Streptococcus species NOT DETECTED NOT DETECTED Final   Streptococcus agalactiae NOT DETECTED NOT DETECTED Final   Streptococcus pneumoniae NOT DETECTED NOT DETECTED Final   Streptococcus pyogenes NOT DETECTED NOT DETECTED Final   Acinetobacter baumannii NOT DETECTED NOT DETECTED Final   Enterobacteriaceae species NOT DETECTED NOT DETECTED Final   Enterobacter cloacae complex NOT DETECTED NOT DETECTED Final   Escherichia coli NOT DETECTED NOT DETECTED Final   Klebsiella oxytoca NOT DETECTED NOT DETECTED Final   Klebsiella pneumoniae NOT DETECTED NOT DETECTED Final   Proteus species NOT DETECTED NOT DETECTED Final   Serratia marcescens NOT DETECTED NOT DETECTED Final   Haemophilus influenzae NOT DETECTED NOT DETECTED Final   Neisseria meningitidis NOT DETECTED NOT DETECTED Final   Pseudomonas aeruginosa NOT DETECTED NOT DETECTED Final   Candida albicans NOT DETECTED NOT DETECTED Final   Candida glabrata NOT DETECTED NOT DETECTED Final   Candida krusei NOT DETECTED NOT DETECTED Final   Candida parapsilosis NOT DETECTED NOT DETECTED Final   Candida tropicalis NOT DETECTED NOT DETECTED Final    Comment: Performed at [facility], [address] [place] [address] [place], [place] [address]  SARS Coronavirus 2 (Hosp order,Performed in [facility] lab via [employer] ID)     Status: None   Collection Time: 03/23/19  5:41 PM  Result Value Ref Range Status   SARS Coronavirus 2 ([employer] ID Now) NEGATIVE NEGATIVE Final    Comment: (NOTE) Interpretive Result Comment(s): COVID 19 Positive SARS CoV 2 target nucleic acids are DETECTED. The SARS CoV 2 RNA is generally detectable in upper and lower respiratory specimens during the acute phase of infection.  Positive results are indicative of active infection with SARS CoV 2.  Clinical correlation with patient history and other diagnostic information is necessary to determine patient infection status.  Positive  results do not rule out bacterial infection or coinfection with other viruses. The expected result is Negative. COVID 19 Negative SARS CoV 2 target nucleic acids are NOT DETECTED. The SARS CoV 2 RNA is generally detectable in upper and lower respiratory specimens during the acute phase of infection.  Negative results do not preclude SARS CoV 2 infection, do not rule out coinfections with other pathogens, and should not be used as the sole basis for treatment or other patient management decisions.  Negative results must be combined with clinical  observations, patient history, and epidemiological information. The expected result is Negative. Invalid Presence or absence of SARS CoV 2 nucleic acids cannot be determined. Repeat testing was performed on the submitted specimen and repeated Invalid results were obtained.  If clinically indicated, additional testing on a new specimen with an alternate test methodology [phone]([id number]) is advised.  The SARS CoV 2 RNA is generally detectable in upper and lower respiratory specimens during the acute phase of infection. The expected result is Negative. Fact Sheet for Patients:  http://www.graves-ford.org/https://www.fda.gov/media/136524/download Fact Sheet for Healthcare Providers: EnviroConcern.sihttps://www.fda.gov/media/136523/download This test is not yet approved or cleared by the [place] FDA and has been authorized for detection and/or diagnosis of SARS CoV 2 by FDA under an Emergency Use Authorization (EUA).  This EUA will remain in effect (meaning this test can be used) for the duration of the COVID19 d eclaration under Section 564(b)(1) of the Act, 21 U.S.C. section 8020104945360bbb 3(b)(1), unless the authorization is terminated or revoked sooner. Performed at [facility] [place], [address] [place], [place] [address]   SARS Coronavirus 2  Status: None   Collection Time: 03/24/19 12:55 AM  Result Value Ref Range Status   SARS Coronavirus 2 NOT DETECTED NOT DETECTED  Final    Comment: (NOTE) SARS-CoV-2 target nucleic acids are NOT DETECTED. The SARS-CoV-2 RNA is generally detectable in upper and lower respiratory specimens during the acute phase of infection.  Negative  results do not preclude SARS-CoV-2 infection, do not rule out co-infections with other pathogens, and should not be used as the sole basis for treatment or other patient management decisions.  Negative results must be combined with clinical observations, patient history, and epidemiological information. The expected result is Not Detected. Fact Sheet for Patients: http://www.biofiredefense.com/wp-content/uploads/2020/03/BIOFIRE-COVID -19-patients.pdf Fact Sheet for Healthcare Providers: http://www.biofiredefense.com/wp-content/uploads/2020/03/BIOFIRE-COVID -19-hcp.pdf This test is not yet approved or cleared by the [place] and  has been authorized for detection and/or diagnosis of SARS-CoV-2 by FDA under an Emergency Use Authorization (EUA).  This EUA will remain in effec t (meaning this test can be used) for the duration of  the COVID-19 declaration under Section 564(b)(1) of the Act, 21 U.S.C. section 360bbb-3(b)(1), unless the authorization is terminated or revoked sooner. Performed at [facility], [address], [place], [place] [address]          Radiology Studies: No results found.      Scheduled Meds: . folic acid  1 mg Oral Daily  . heparin  5,000 Units Subcutaneous Q8H  . multivitamin with minerals  1 tablet Oral Daily  . nicotine  21 mg Transdermal Daily  . potassium & sodium phosphates  1 packet Oral TID WC & HS  . thiamine  100 mg Oral Daily   Continuous Infusions: . dextrose 5 % and 0.9% NaCl 100 mL/hr at 03/26/19 0621  . vancomycin 750 mg (03/26/19 0917)     LOS: 2 days    Time spent: 32 minutes.     Kathlen Mody, MD [facility] Pager [phone]   If 7PM-7AM, please contact night-coverage [email or web address]  Password [facility] 03/26/2019, 10:48 AM

## 2019-03-27 ENCOUNTER — Inpatient Hospital Stay (HOSPITAL_COMMUNITY): Payer: Self-pay

## 2019-03-27 LAB — CBC
HCT: 35.5 % — ABNORMAL LOW (ref 39.0–52.0)
Hemoglobin: 12 g/dL — ABNORMAL LOW (ref 13.0–17.0)
MCH: 37.6 pg — ABNORMAL HIGH (ref 26.0–34.0)
MCHC: 33.8 g/dL (ref 30.0–36.0)
MCV: 111.3 fL — ABNORMAL HIGH (ref 80.0–100.0)
Platelets: 102 10*3/uL — ABNORMAL LOW (ref 150–400)
RBC: 3.19 MIL/uL — ABNORMAL LOW (ref 4.22–5.81)
RDW: 16.1 % — ABNORMAL HIGH (ref 11.5–15.5)
WBC: 6.8 10*3/uL (ref 4.0–10.5)
nRBC: 0 % (ref 0.0–0.2)

## 2019-03-27 LAB — MAGNESIUM: Magnesium: 1.8 mg/dL (ref 1.7–2.4)

## 2019-03-27 LAB — COMPREHENSIVE METABOLIC PANEL
ALT: 32 U/L (ref 0–44)
AST: 84 U/L — ABNORMAL HIGH (ref 15–41)
Albumin: 2.4 g/dL — ABNORMAL LOW (ref 3.5–5.0)
Alkaline Phosphatase: 113 U/L (ref 38–126)
Anion gap: 5 (ref 5–15)
BUN: <5 mg/dL — ABNORMAL LOW (ref 6–20)
CO2: 26 mmol/L (ref 22–32)
Calcium: 8.2 mg/dL — ABNORMAL LOW (ref 8.9–10.3)
Chloride: 104 mmol/L (ref 98–111)
Creatinine, Ser: 0.63 mg/dL (ref 0.61–1.24)
GFR calc Af Amer: >60 mL/min (ref >60)
GFR calc non Af Amer: >60 mL/min (ref >60)
Glucose, Bld: 116 mg/dL — ABNORMAL HIGH (ref 70–99)
Potassium: 4.1 mmol/L (ref 3.5–5.1)
Sodium: 135 mmol/L (ref 135–145)
Total Bilirubin: 0.9 mg/dL (ref 0.3–1.2)
Total Protein: 5.5 g/dL — ABNORMAL LOW (ref 6.5–8.1)

## 2019-03-27 MED ORDER — TECHNETIUM TC 99M MEBROFENIN IV KIT
5.0000 | PACK | Freq: Once | INTRAVENOUS | Status: AC | PRN
  Administered 2019-03-27: 5 via INTRAVENOUS

## 2019-03-27 NOTE — Evaluation (Signed)
Occupational Therapy Evaluation Patient Details Name: Tom Bender Epps MRN: [id number] DOB: 02-Aug-1961 Today's Date: 03/27/2019    History of Present Illness 58 y.o. male with medical history significant of alcohol abuse, tobacco abuse, chronic kidney disease stage II, hypertension, presenting with progressive weakness body aches headaches chest pain and shortness of breath, weakness and falls, along with 8/10 abdominal pain. Pt admitted 6/5 for evaluation for SIRS.    Clinical Impression   Pt was admitted for the above.  At baseline, he lives in split level home with his sister and he assists her with mostly IADLs.  Pt now needs min A for UB and mod to max A for LB adls. He needed +2 safety to ambulate. Will follow in acute setting with min guard level goals. Anticipate he may  need SNF due to decreased caregiver support    Follow Up Recommendations  SNF    Equipment Recommendations  None recommended by OT    Recommendations for Other Services       Precautions / Restrictions Precautions Precautions: Fall Precaution Comments: hx of falling 3 in last month Restrictions Weight Bearing Restrictions: No      Mobility Bed Mobility Overal bed mobility: Needs Assistance Bed Mobility: Supine to Sit     Supine to sit: Min guard     General bed mobility comments: min guard for safety, increased time and effort required  Transfers Overall transfer level: Needs assistance Equipment used: Rolling walker (2 wheeled) Transfers: Sit to/from Stand Sit to Stand: Min assist;+2 safety/equipment         General transfer comment: minA for power up and steadying, vc for proper hand placement for power up, increased tremors with coming to standing which decreased in amplitude with time    Balance Overall balance assessment: Needs assistance Sitting-balance support: Feet supported;No upper extremity supported Sitting balance-Leahy Scale: Fair     Standing balance support: Bilateral  upper extremity supported;During functional activity Standing balance-Leahy Scale: Poor Standing balance comment: requires UE support to maintain balance, tremulous in static standing                           ADL either performed or assessed with clinical judgement   ADL Overall ADL's : Needs assistance/impaired Eating/Feeding: Set up Eating/Feeding Details (indicate cue type and reason): pt currently NPO. He had soda in room for meds and poured from can into cup independently.  Gave him strategies for minimizing tremors Grooming: Set up;Sitting   Upper Body Bathing: Minimal assistance;Sitting   Lower Body Bathing: Moderate assistance;Sit to/from stand   Upper Body Dressing : Minimal assistance;Sitting   Lower Body Dressing: Maximal assistance;Sit to/from stand   Toilet Transfer: Minimal assistance;Ambulation;RW;+2 for safety/equipment(back to bed)             General ADL Comments: pt has chronic back pain; he can usually bring leg up and reach during seated adls; limited today     Vision         Perception     Praxis      Pertinent Vitals/Pain Pain Assessment: 0-10 Pain Score: 7  Pain Location: abdomen Pain Descriptors / Indicators: Constant;Sharp Pain Intervention(s): Limited activity within patient's tolerance;Monitored during session;Repositioned     Hand Dominance     Extremity/Trunk Assessment Upper Extremity Assessment Upper Extremity Assessment: Generalized weakness;RUE deficits/detail;LUE deficits/detail RUE Deficits / Details: painful especially at elbow with full extension and shoulder at 90.  Moves slowly. bil UEs with tremors LUE Deficits /  Details: FF limited to approximately 100          Communication Communication Communication: No difficulties   Cognition Arousal/Alertness: Awake/alert Behavior During Therapy: WFL for tasks assessed/performed Overall Cognitive Status: Impaired/Different from baseline Area of Impairment:  Following commands;Problem solving                       Following Commands: Follows multi-step commands with increased time     Problem Solving: Slow processing;Requires verbal cues;Requires tactile cues     General Comments  HR at rest 89 bpm, with activity HR increased to a max of 144 bpm    Exercises     Shoulder Instructions      Home Living Family/patient expects to be discharged to:: Private residence Living Arrangements: Other relatives Available Help at Discharge: Family;Available 24 hours/day;Available PRN/intermittently(caregiver for sister who lives with him, brother can help PR) Type of Home: House Home Access: Stairs to enter Secretary/administratorntrance Stairs-Number of Steps: 2   Home Layout: Two level Alternate Level Stairs-Number of Steps: 5 Alternate Level Stairs-Rails: Can reach both;Right;Left Bathroom Shower/Tub: Tub/shower unit(doesn't work currently due to Investment banker, corporateplumbing issue)   FirefighterBathroom Toilet: Standard Bathroom Accessibility: Yes   Home Equipment: Environmental consultantWalker - 2 wheels          Prior Functioning/Environment Level of Independence: Independent        Comments: pt was assisting his sister at home.  She could get to commode; he emptied bucket and did mostly iadls        OT Problem List: Decreased strength;Decreased activity tolerance;Impaired balance (sitting and/or standing);Cardiopulmonary status limiting activity;Pain;Decreased knowledge of use of DME or AE      OT Treatment/Interventions: Self-care/ADL training;Energy conservation;DME and/or AE instruction;Therapeutic activities;Patient/family education;Balance training;Therapeutic exercise    OT Goals(Current goals can be found in the care plan section) Acute Rehab OT Goals Patient Stated Goal: have less pain OT Goal Formulation: With patient Time For Goal Achievement: 04/10/19 ADL Goals Pt Will Perform Grooming: with min guard assist;standing Pt Will Transfer to Toilet: with min guard  assist;ambulating;regular height toilet(vs 3:1) Additional ADL Goal #1: pt will perform adl with set up and min guard when standing, using AE as needed Additional ADL Goal #2: pt will self initiate at least one rest break as needed without cues  OT Frequency: Min 2X/week   Barriers to D/C: Decreased caregiver support          Co-evaluation              AM-PAC OT "6 Clicks" Daily Activity     Outcome Measure Help from another person eating meals?: None Help from another person taking care of personal grooming?: A Little Help from another person toileting, which includes using toliet, bedpan, or urinal?: A Lot Help from another person bathing (including washing, rinsing, drying)?: A Lot Help from another person to put on and taking off regular upper body clothing?: A Little Help from another person to put on and taking off regular lower body clothing?: A Lot 6 Click Score: 16   End of Session    Activity Tolerance: Treatment limited secondary to medical complications (Comment)(tachy) Patient left: in bed;with call bell/phone within reach;with bed alarm set  OT Visit Diagnosis: Unsteadiness on feet (R26.81);Muscle weakness (generalized) (M62.81);Pain Pain - Right/Left: Right Pain - part of body: Arm                Time: [id number] OT Time Calculation (min): 16 min Charges:  OT General Charges $  OT Visit: 1 Visit OT Evaluation $OT Eval Moderate Complexity: [place], OTR/L Acute Rehabilitation Services [phone] [facility] pager [phone] office 03/27/2019  Micaela Stith 03/27/2019, 12:30 PM

## 2019-03-27 NOTE — Progress Notes (Signed)
Pharmacy Antibiotic Note  Dnaiel Bender is a 58 y.o. male admitted on 03/23/2019 with concern for sepsis and found to have 2/2 BCx positive for MSSE (MRSE on BCID). It is being evaluated whether this is a true pathogen or contamination - repeat blood cultures are clear.  Pharmacy has been consulted for Vancomycin dosing.   MD has discussed with ID and the plan is to d/c if repeat blood cultures are clear. Repeat blood cultures thus far from 6/7 are ngtd.   Vancomycin 750 mg IV Q 8 hrs. Goal AUC 400-550. Expected AUC: 434 SCr used: 0.8  Plan: - Continue Vancomycin 750 mg IV every 8 hours - Will hold off on levels for now - expected discontinuation soon - Will continue to follow renal function, culture results, LOT, and antibiotic de-escalation plans   Height: 5\' 9"  (175.3 cm) Weight: 172 lb 3.2 oz (78.1 kg) IBW/kg (Calculated) : 70.7  Temp (24hrs), Avg:98.1 F (36.7 C), Min:98 F (36.7 C), Max:98.3 F (36.8 C)  Recent Labs  Lab 03/23/19 1606 03/23/19 1745 03/24/19 0021 03/24/19 0950 03/25/19 1124 03/26/19 0436 03/27/19 1014  WBC 6.4  --  5.9  --   --  5.8 6.8  CREATININE 0.82  --  0.73  --  0.63 0.56* 0.63  LATICACIDVEN 4.7* 3.8*  --  1.7  --   --   --     Estimated Creatinine Clearance: 104.3 mL/min (by C-G formula based on SCr of 0.63 mg/dL).    No Known Allergies  Antimicrobials this admission: Vanc 6/5 >> Cefepime 6/5 >>6/6 Flagyl 6/5 x1  Microbiology results: 6/5 Bcx: 2/4 Staph species - both Staph hominis. 2/4 (different sets) CoNS w/ MecA positive 6/5 Ucx: NGF 6/6 COVID negative 6/7 Bcx: ngtd  Thank you for allowing pharmacy to be a part of this patient's care.  Alycia Rossetti, PharmD, BCPS Clinical Pharmacist Clinical phone for 03/27/2019: [phone] 03/27/2019 1:20 PM   **Pharmacist phone directory can now be found on [email or web address] ([email or web address]).  Listed under [place].

## 2019-03-27 NOTE — Plan of Care (Signed)

## 2019-03-27 NOTE — H&P (View-Only) (Signed)
[facility] Consultation Note  Referring Provider: [facility], Dr. Blake DivineAkula Primary Care Physician:  Patient, No Pcp Per  Reason for Consultation:  Abdominal pain  HPI: Elise BenneBanks Gott is a 58 y.o. male admitted with viral syndrome with muscle aches and myalgias; COVID test negative.  Has had few weeks of epigastric and right upper quadrant dull pain, improves transiently after eating.  History alcohol and tobacco abuse.  NSAIDs?  No reported hematemesis or blood in stool.  Mild elevation of liver enzymes without biliary ductal dilatation.  Gallstones seen on U/S; HIDA showed no cystic duct or bile duct occlusion.   Past Medical History:  Diagnosis Date  . ETOH abuse   . Hypertension   . Liver disease   . Renal insufficiency     History reviewed. No pertinent surgical history.  Prior to Admission medications   Not on File    Current Facility-Administered Medications  Medication Dose Route Frequency Provider Last Rate Last Dose  . alum & mag hydroxide-simeth (MAALOX/MYLANTA) 200-200-20 MG/5ML suspension 30 mL  30 mL Oral Q4H PRN Kathlen ModyAkula, Vijaya, MD   30 mL at 03/26/19 0926  . folic acid (FOLVITE) tablet 1 mg  1 mg Oral Daily Kathlen ModyAkula, Vijaya, MD   1 mg at 03/27/19 1018  . heparin injection 5,000 Units  5,000 Units Subcutaneous Q8H Rometta EmeryGarba, Mohammad L, MD   5,000 Units at 03/27/19 0503  . morphine 2 MG/ML injection 2 mg  2 mg Intravenous Q4H PRN Rometta EmeryGarba, Mohammad L, MD   2 mg at 03/27/19 1018  . multivitamin with minerals tablet 1 tablet  1 tablet Oral Daily Rometta EmeryGarba, Mohammad L, MD   1 tablet at 03/27/19 1017  . nicotine (NICODERM CQ - dosed in mg/24 hours) patch 21 mg  21 mg Transdermal Daily Bodenheimer, Charles A, NP   21 mg at 03/27/19 1019  . ondansetron (ZOFRAN) tablet 4 mg  4 mg Oral Q6H PRN Rometta EmeryGarba, Mohammad L, MD   4 mg at 03/26/19 [id number]   Or  . ondansetron (ZOFRAN) injection 4 mg  4 mg Intravenous Q6H PRN Rometta EmeryGarba, Mohammad L, MD   4 mg at 03/25/19 2027  . pantoprazole  (PROTONIX) EC tablet 40 mg  40 mg Oral Daily Kathlen ModyAkula, Vijaya, MD   40 mg at 03/27/19 1017  . potassium & sodium phosphates (PHOS-NAK) 280-160-250 MG packet 1 packet  1 packet Oral TID WC & HS Kathlen ModyAkula, Vijaya, MD   1 packet at 03/26/19 2110  . thiamine (VITAMIN B-1) tablet 100 mg  100 mg Oral Daily Earlie LouGarba, Mohammad L, MD   100 mg at 03/27/19 1018  . vancomycin (VANCOCIN) IVPB 750 mg/150 ml premix  750 mg Intravenous Q8H Almon HerculesBaird, Haley P, RPH 150 mL/hr at 03/27/19 1023 750 mg at 03/27/19 1023    Allergies as of 03/23/2019  . (No Known Allergies)    History reviewed. No pertinent family history.  Social History   Socioeconomic History  . Marital status: Single    Spouse name: Not on file  . Number of children: Not on file  . Years of education: Not on file  . Highest education level: Not on file  Occupational History  . Not on file  Social Needs  . Financial resource strain: Not on file  . Food insecurity:    Worry: Not on file    Inability: Not on file  . Transportation needs:    Medical: Not on file    Non-medical: Not on file  Tobacco Use  . Smoking  status: Current Every Day Smoker  . Smokeless tobacco: Never Used  Substance and Sexual Activity  . Alcohol use: Yes    Alcohol/week: 12.0 standard drinks    Types: 12 Cans of beer per week  . Drug use: Not Currently  . Sexual activity: Not on file  Lifestyle  . Physical activity:    Days per week: Not on file    Minutes per session: Not on file  . Stress: Not on file  Relationships  . Social connections:    Talks on phone: Not on file    Gets together: Not on file    Attends religious service: Not on file    Active member of club or organization: Not on file    Attends meetings of clubs or organizations: Not on file    Relationship status: Not on file  . Intimate partner violence:    Fear of current or ex partner: Not on file    Emotionally abused: Not on file    Physically abused: Not on file    Forced sexual activity:  Not on file  Other Topics Concern  . Not on file  Social History Narrative  . Not on file    Review of Systems: As per HPI, all others negative  Physical Exam: Vital signs in last 24 hours: Temp:  [98 F (36.7 C)-98.3 F (36.8 C)] 98.3 F (36.8 C) (06/09 0334) Pulse Rate:  [85-91] 91 (06/09 0334) Resp:  [15-22] 21 (06/09 0334) BP: (111-117)/(90-96) 117/90 (06/09 0334) SpO2:  [97 %-99 %] 97 % (06/09 0334) Weight:  [78.1 kg] 78.1 kg (06/09 0345) Last BM Date: 03/26/19 General:   Alert,  Somewhat cachectic-appearing, much older-appearing than stated age, pleasant and cooperative in NAD Head:  Normocephalic and atraumatic. Eyes:  Sclera clear, no icterus.   Conjunctiva pink. Ears:  Normal auditory acuity. Nose:  No deformity, discharge,  or lesions. Mouth:  No deformity or lesions.  Oropharynx pink & moist. Neck:  Supple; no masses or thyromegaly. Lungs:  Clear throughout to auscultation.   No wheezes, crackles, or rhonchi. No acute distress. Heart:  Regular rate and rhythm; no murmurs, clicks, rubs,  or gallops. Abdomen:  Soft,mild epigastric tenderness. No masses, hepatosplenomegaly or hernias noted. Normal bowel sounds, without guarding, and without rebound.     Msk:  Symmetrical without gross deformities. Normal posture. Pulses:  Normal pulses noted. Extremities:  Without clubbing or edema. Neurologic:  Alert and  oriented x4; diffusely weak, otherwisegrossly normal neurologically. Skin:  Scattered ecchymoses (especially abdomen after recent fall), otherwise intact without significant lesions or rashes. Psych:  Alert and cooperative. Normal mood and affect.   Lab Results: Recent Labs    03/26/19 0436 03/27/19 1014  WBC 5.8 6.8  HGB 11.6* 12.0*  HCT 33.7* 35.5*  PLT 94* 102*   BMET Recent Labs    03/25/19 1124 03/26/19 0436 03/27/19 1014  NA 134* 135 135  K 3.1* 3.7 4.1  CL 98 104 104  CO2 27 25 26   GLUCOSE 117* 94 116*  BUN <5* <5* <5*  CREATININE 0.63  0.56* 0.63  CALCIUM 7.7* 7.7* 8.2*   LFT Recent Labs    03/26/19 0436 03/27/19 1014  PROT 5.4* 5.5*  ALBUMIN 2.2* 2.4*  AST 78* 84*  ALT 33 32  ALKPHOS 110 113  BILITOT 1.1 0.9  BILIDIR 0.4*  --   IBILI 0.7  --    PT/INR No results for input(s): LABPROT, INR in the last 72 hours.  Studies/Results:  Dg Lumbar Spine 2-3 Views  Result Date: 03/26/2019 CLINICAL DATA:  Low back pain EXAM: LUMBAR SPINE - 2-3 VIEW COMPARISON:  03/23/2019 FINDINGS: Five lumbar type vertebral bodies are well visualized. Vertebral body height is well maintained. Mild osteophytic changes are noted similar to that seen on prior CT examination. No soft tissue abnormality is noted. IMPRESSION: Mild degenerative change without acute abnormality. Electronically Signed   By: Alcide CleverMark  Lukens M.D.   On: 03/26/2019 15:44   Nm Hepato W/eject Fract  Result Date: 03/27/2019 CLINICAL DATA:  Epigastric pain EXAM: NUCLEAR MEDICINE HEPATOBILIARY IMAGING WITH GALLBLADDER EF TECHNIQUE: Sequential images of the abdomen were obtained out to 60 minutes following intravenous administration of radiopharmaceutical. After oral ingestion of Ensure, gallbladder ejection fraction was determined. At 60 min, normal ejection fraction is greater than 33%. RADIOPHARMACEUTICALS:  5.2 mCi Tc-5864m  Choletec IV COMPARISON:  None. FINDINGS: Prompt uptake and biliary excretion of activity by the liver is seen. Gallbladder activity is visualized, consistent with patency of cystic duct. Biliary activity passes into small bowel, consistent with patent common bile duct. Calculated gallbladder ejection fraction is 92%. (Normal gallbladder ejection fraction with Ensure is greater than 33%.) IMPRESSION: Normal uptake and excretion of biliary tracer. Electronically Signed   By: Alcide CleverMark  Lukens M.D.   On: 03/27/2019 10:17    Impression:  1.  Abdominal pain, EG+RUQ, liver vs gallbladder versus stomach.  HIDA negative; gallstones noted. 2.  Elevated liver enzymes,  downtrending, reactive from infection versus gallbladder versus alcohol versus other.  Suspect the 2:1 AST:ALT pattern most consistent with alcohol use. 3.  Gallstones.  Unclear if incidental finding or source of patient's pain. 4.  Gram positive bacteremia, non enterococcal. Contaminant?  Unclear etiology; non enterococcal species not typical for GI tract etiology. 5.  Alcohol and tobacco abuse.  Plan:  1.  Diet today ok, NPO after midnight. 2.  PPI. 3.  Endoscopy tomorrow. 4.  If endoscopy unrevealing, consider surgical consult for discussion of possible cholecystectomy. 5.  [facility] will follow.   LOS: 3 days   Chirstine Defrain M  03/27/2019, 12:10 PM  Cell [phone] If no answer or after 5 PM call [phone]

## 2019-03-27 NOTE — Consult Note (Signed)
[facility] Consultation Note  Referring Provider: [facility], Dr. Akula Primary Care Physician:  Patient, No Pcp Per  Reason for Consultation:  Abdominal pain  HPI: Tom Bender is a 58 y.o. male admitted with viral syndrome with muscle aches and myalgias; COVID test negative.  Has had few weeks of epigastric and right upper quadrant dull pain, improves transiently after eating.  History alcohol and tobacco abuse.  NSAIDs?  No reported hematemesis or blood in stool.  Mild elevation of liver enzymes without biliary ductal dilatation.  Gallstones seen on U/S; HIDA showed no cystic duct or bile duct occlusion.   Past Medical History:  Diagnosis Date  . ETOH abuse   . Hypertension   . Liver disease   . Renal insufficiency     History reviewed. No pertinent surgical history.  Prior to Admission medications   Not on File    Current Facility-Administered Medications  Medication Dose Route Frequency Provider Last Rate Last Dose  . alum & mag hydroxide-simeth (MAALOX/MYLANTA) 200-200-20 MG/5ML suspension 30 mL  30 mL Oral Q4H PRN Akula, Vijaya, MD   30 mL at 03/26/19 0926  . folic acid (FOLVITE) tablet 1 mg  1 mg Oral Daily Akula, Vijaya, MD   1 mg at 03/27/19 1018  . heparin injection 5,000 Units  5,000 Units Subcutaneous Q8H Garba, Mohammad L, MD   5,000 Units at 03/27/19 0503  . morphine 2 MG/ML injection 2 mg  2 mg Intravenous Q4H PRN Garba, Mohammad L, MD   2 mg at 03/27/19 1018  . multivitamin with minerals tablet 1 tablet  1 tablet Oral Daily Garba, Mohammad L, MD   1 tablet at 03/27/19 1017  . nicotine (NICODERM CQ - dosed in mg/24 hours) patch 21 mg  21 mg Transdermal Daily Bodenheimer, Charles A, NP   21 mg at 03/27/19 1019  . ondansetron (ZOFRAN) tablet 4 mg  4 mg Oral Q6H PRN Garba, Mohammad L, MD   4 mg at 03/26/19 0623   Or  . ondansetron (ZOFRAN) injection 4 mg  4 mg Intravenous Q6H PRN Garba, Mohammad L, MD   4 mg at 03/25/19 2027  . pantoprazole  (PROTONIX) EC tablet 40 mg  40 mg Oral Daily Akula, Vijaya, MD   40 mg at 03/27/19 1017  . potassium & sodium phosphates (PHOS-NAK) 280-160-250 MG packet 1 packet  1 packet Oral TID WC & HS Akula, Vijaya, MD   1 packet at 03/26/19 2110  . thiamine (VITAMIN B-1) tablet 100 mg  100 mg Oral Daily Garba, Mohammad L, MD   100 mg at 03/27/19 1018  . vancomycin (VANCOCIN) IVPB 750 mg/150 ml premix  750 mg Intravenous Q8H Baird, Haley P, RPH 150 mL/hr at 03/27/19 1023 750 mg at 03/27/19 1023    Allergies as of 03/23/2019  . (No Known Allergies)    History reviewed. No pertinent family history.  Social History   Socioeconomic History  . Marital status: Single    Spouse name: Not on file  . Number of children: Not on file  . Years of education: Not on file  . Highest education level: Not on file  Occupational History  . Not on file  Social Needs  . Financial resource strain: Not on file  . Food insecurity:    Worry: Not on file    Inability: Not on file  . Transportation needs:    Medical: Not on file    Non-medical: Not on file  Tobacco Use  . Smoking   status: Current Every Day Smoker  . Smokeless tobacco: Never Used  Substance and Sexual Activity  . Alcohol use: Yes    Alcohol/week: 12.0 standard drinks    Types: 12 Cans of beer per week  . Drug use: Not Currently  . Sexual activity: Not on file  Lifestyle  . Physical activity:    Days per week: Not on file    Minutes per session: Not on file  . Stress: Not on file  Relationships  . Social connections:    Talks on phone: Not on file    Gets together: Not on file    Attends religious service: Not on file    Active member of club or organization: Not on file    Attends meetings of clubs or organizations: Not on file    Relationship status: Not on file  . Intimate partner violence:    Fear of current or ex partner: Not on file    Emotionally abused: Not on file    Physically abused: Not on file    Forced sexual activity:  Not on file  Other Topics Concern  . Not on file  Social History Narrative  . Not on file    Review of Systems: As per HPI, all others negative  Physical Exam: Vital signs in last 24 hours: Temp:  [98 F (36.7 C)-98.3 F (36.8 C)] 98.3 F (36.8 C) (06/09 0334) Pulse Rate:  [85-91] 91 (06/09 0334) Resp:  [15-22] 21 (06/09 0334) BP: (111-117)/(90-96) 117/90 (06/09 0334) SpO2:  [97 %-99 %] 97 % (06/09 0334) Weight:  [78.1 kg] 78.1 kg (06/09 0345) Last BM Date: 03/26/19 General:   Alert,  Somewhat cachectic-appearing, much older-appearing than stated age, pleasant and cooperative in NAD Head:  Normocephalic and atraumatic. Eyes:  Sclera clear, no icterus.   Conjunctiva pink. Ears:  Normal auditory acuity. Nose:  No deformity, discharge,  or lesions. Mouth:  No deformity or lesions.  Oropharynx pink & moist. Neck:  Supple; no masses or thyromegaly. Lungs:  Clear throughout to auscultation.   No wheezes, crackles, or rhonchi. No acute distress. Heart:  Regular rate and rhythm; no murmurs, clicks, rubs,  or gallops. Abdomen:  Soft,mild epigastric tenderness. No masses, hepatosplenomegaly or hernias noted. Normal bowel sounds, without guarding, and without rebound.     Msk:  Symmetrical without gross deformities. Normal posture. Pulses:  Normal pulses noted. Extremities:  Without clubbing or edema. Neurologic:  Alert and  oriented x4; diffusely weak, otherwisegrossly normal neurologically. Skin:  Scattered ecchymoses (especially abdomen after recent fall), otherwise intact without significant lesions or rashes. Psych:  Alert and cooperative. Normal mood and affect.   Lab Results: Recent Labs    03/26/19 0436 03/27/19 1014  WBC 5.8 6.8  HGB 11.6* 12.0*  HCT 33.7* 35.5*  PLT 94* 102*   BMET Recent Labs    03/25/19 1124 03/26/19 0436 03/27/19 1014  NA 134* 135 135  K 3.1* 3.7 4.1  CL 98 104 104  CO2 27 25 26   GLUCOSE 117* 94 116*  BUN <5* <5* <5*  CREATININE 0.63  0.56* 0.63  CALCIUM 7.7* 7.7* 8.2*   LFT Recent Labs    03/26/19 0436 03/27/19 1014  PROT 5.4* 5.5*  ALBUMIN 2.2* 2.4*  AST 78* 84*  ALT 33 32  ALKPHOS 110 113  BILITOT 1.1 0.9  BILIDIR 0.4*  --   IBILI 0.7  --    PT/INR No results for input(s): LABPROT, INR in the last 72 hours.  Studies/Results:  Dg Lumbar Spine 2-3 Views  Result Date: 03/26/2019 CLINICAL DATA:  Low back pain EXAM: LUMBAR SPINE - 2-3 VIEW COMPARISON:  03/23/2019 FINDINGS: Five lumbar type vertebral bodies are well visualized. Vertebral body height is well maintained. Mild osteophytic changes are noted similar to that seen on prior CT examination. No soft tissue abnormality is noted. IMPRESSION: Mild degenerative change without acute abnormality. Electronically Signed   By: Alcide CleverMark  Lukens M.D.   On: 03/26/2019 15:44   Nm Hepato W/eject Fract  Result Date: 03/27/2019 CLINICAL DATA:  Epigastric pain EXAM: NUCLEAR MEDICINE HEPATOBILIARY IMAGING WITH GALLBLADDER EF TECHNIQUE: Sequential images of the abdomen were obtained out to 60 minutes following intravenous administration of radiopharmaceutical. After oral ingestion of Ensure, gallbladder ejection fraction was determined. At 60 min, normal ejection fraction is greater than 33%. RADIOPHARMACEUTICALS:  5.2 mCi Tc-5864m  Choletec IV COMPARISON:  None. FINDINGS: Prompt uptake and biliary excretion of activity by the liver is seen. Gallbladder activity is visualized, consistent with patency of cystic duct. Biliary activity passes into small bowel, consistent with patent common bile duct. Calculated gallbladder ejection fraction is 92%. (Normal gallbladder ejection fraction with Ensure is greater than 33%.) IMPRESSION: Normal uptake and excretion of biliary tracer. Electronically Signed   By: Alcide CleverMark  Lukens M.D.   On: 03/27/2019 10:17    Impression:  1.  Abdominal pain, EG+RUQ, liver vs gallbladder versus stomach.  HIDA negative; gallstones noted. 2.  Elevated liver enzymes,  downtrending, reactive from infection versus gallbladder versus alcohol versus other.  Suspect the 2:1 AST:ALT pattern most consistent with alcohol use. 3.  Gallstones.  Unclear if incidental finding or source of patient's pain. 4.  Gram positive bacteremia, non enterococcal. Contaminant?  Unclear etiology; non enterococcal species not typical for GI tract etiology. 5.  Alcohol and tobacco abuse.  Plan:  1.  Diet today ok, NPO after midnight. 2.  PPI. 3.  Endoscopy tomorrow. 4.  If endoscopy unrevealing, consider surgical consult for discussion of possible cholecystectomy. 5.  [facility] will follow.   LOS: 3 days   Shaun Runyon M  03/27/2019, 12:10 PM  Cell [phone] If no answer or after 5 PM call [phone]

## 2019-03-27 NOTE — Evaluation (Signed)
Physical Therapy Evaluation Patient Details Name: Tom Bender MRN: [id number] DOB: 08-Oct-1961 Today's Date: 03/27/2019   History of Present Illness  58 y.o. male with medical history significant of alcohol abuse, tobacco abuse, chronic kidney disease stage II, hypertension, presenting with progressive weakness body aches headaches chest pain and shortness of breath, weakness and falls, along with 8/10 abdominal pain. Pt admitted 6/5 for evaluation for SIRS.   Clinical Impression  PTA pt primary caregiver of sister, independent in mobility without AD and iADLs. Pt lives in 2 story home on second floor with 5 steps to enter. Sister lives on first floor. Pt has had no running water since beginning of COVID outbreak. Pt currently limited in safe mobility by abdominal pain, and tremors, in presence of decreased strength, balance and endurance. Pt is min guard for bed mobility, and min Ax2 for transfers and ambulation of 25 feet with RW. PT recommending SNF level rehab at d/c to improve strength and endurance. PT will continue to follow acutely.      Follow Up Recommendations SNF    Equipment Recommendations  Other (comment)(TBD at next venue)       Precautions / Restrictions Precautions Precautions: Fall Precaution Comments: hx of falling 3 in last month Restrictions Weight Bearing Restrictions: No      Mobility  Bed Mobility Overal bed mobility: Needs Assistance Bed Mobility: Supine to Sit     Supine to sit: Min guard     General bed mobility comments: min guard for safety, increased time and effort required  Transfers Overall transfer level: Needs assistance Equipment used: Rolling walker (2 wheeled) Transfers: Sit to/from Stand Sit to Stand: Min assist;+2 safety/equipment         General transfer comment: minA for power up and steadying, vc for proper hand placement for power up, increased tremors with coming to standing which decreased in amplitude with  time  Ambulation/Gait Ambulation/Gait assistance: Min assist;+2 safety/equipment Gait Distance (Feet): 25 Feet Assistive device: Rolling walker (2 wheeled) Gait Pattern/deviations: Step-through pattern;Decreased step length - right;Decreased step length - left;Trunk flexed Gait velocity: slowed Gait velocity interpretation: <1.8 ft/sec, indicate of risk for recurrent falls General Gait Details: minA for steadying, pt tremulous with gait, multimodal cues for proximity to RW, mildly unsteady, no overt LoB       Balance Overall balance assessment: Needs assistance Sitting-balance support: Feet supported;No upper extremity supported Sitting balance-Leahy Scale: Fair     Standing balance support: Bilateral upper extremity supported;During functional activity Standing balance-Leahy Scale: Poor Standing balance comment: requires UE support to maintain balance, tremulous in static standing                             Pertinent Vitals/Pain Pain Assessment: 0-10 Pain Score: 7  Pain Location: abdomen Pain Descriptors / Indicators: Constant;Sharp Pain Intervention(s): Limited activity within patient's tolerance;Monitored during session;Repositioned    Home Living Family/patient expects to be discharged to:: Private residence Living Arrangements: Other relatives Available Help at Discharge: Family;Available 24 hours/day;Available PRN/intermittently(caregiver for sister who lives with him, brother can help PR) Type of Home: House Home Access: Stairs to enter   Secretary/administratorntrance Stairs-Number of Steps: 2 Home Layout: Two level Home Equipment: Walker - 2 wheels      Prior Function Level of Independence: Independent                  Extremity/Trunk Assessment   Upper Extremity Assessment Upper Extremity Assessment: Defer to OT evaluation  Lower Extremity Assessment Lower Extremity Assessment: RLE deficits/detail;LLE deficits/detail RLE Deficits / Details: pt tremulous  with all movement. pt with difficulty flexing hip leaning backward to achieve lift, knee is lacking 10 degrees flexion/extension, strength grossly assessed at 3+/5, pt with tremors while performing ROM  RLE Sensation: decreased light touch(numbness to about ankle ) RLE Coordination: decreased fine motor;decreased gross motor LLE Deficits / Details: V, L knee with increased edema from fall, decreased hip flexion, knee lacking 15 degrees flexion, strength grossly 3+/5 LLE Sensation: decreased light touch(numbness to about ankle ) LLE Coordination: decreased fine motor;decreased gross motor       Communication   Communication: No difficulties  Cognition Arousal/Alertness: Awake/alert Behavior During Therapy: WFL for tasks assessed/performed Overall Cognitive Status: Impaired/Different from baseline Area of Impairment: Following commands;Problem solving                       Following Commands: Follows multi-step commands with increased time     Problem Solving: Slow processing;Requires verbal cues;Requires tactile cues        General Comments General comments (skin integrity, edema, etc.): HR at rest 89 bpm, with activity HR increased to a max of 144 bpm        Assessment/Plan    PT Assessment Patient needs continued PT services  PT Problem List Decreased strength;Decreased range of motion;Decreased activity tolerance;Decreased balance;Decreased mobility;Decreased coordination;Decreased cognition;Decreased knowledge of use of DME;Cardiopulmonary status limiting activity;Pain;Impaired sensation       PT Treatment Interventions DME instruction;Gait training;Functional mobility training;Therapeutic activities;Therapeutic exercise;Balance training;Cognitive remediation;Patient/family education    PT Goals (Current goals can be found in the Care Plan section)  Acute Rehab PT Goals Patient Stated Goal: have less pain PT Goal Formulation: With patient Time For Goal  Achievement: 04/10/19 Potential to Achieve Goals: Fair    Frequency Min 2X/week    AM-PAC PT "6 Clicks" Mobility  Outcome Measure Help needed turning from your back to your side while in a flat bed without using bedrails?: None Help needed moving from lying on your back to sitting on the side of a flat bed without using bedrails?: None Help needed moving to and from a bed to a chair (including a wheelchair)?: A Little Help needed standing up from a chair using your arms (e.g., wheelchair or bedside chair)?: A Little Help needed to walk in hospital room?: A Little Help needed climbing 3-5 steps with a railing? : Total 6 Click Score: 18    End of Session Equipment Utilized During Treatment: Gait belt Activity Tolerance: Patient limited by fatigue Patient left: in bed;Other (comment)(OT in room on exit) Nurse Communication: Mobility status PT Visit Diagnosis: Unsteadiness on feet (R26.81);Other abnormalities of gait and mobility (R26.89);Repeated falls (R29.6);Muscle weakness (generalized) (M62.81);History of falling (Z91.81);Difficulty in walking, not elsewhere classified (R26.2);Pain Pain - part of body: (abdomen)    Time: 3762-8315 PT Time Calculation (min) (ACUTE ONLY): 18 min   Charges:   PT Evaluation $PT Eval Moderate Complexity: 1 Mod          Raisha Brabender B. Migdalia Dk PT, DPT Acute Rehabilitation Services Pager [phone] Office [phone]   [place] 03/27/2019, 12:15 PM

## 2019-03-27 NOTE — Plan of Care (Signed)

## 2019-03-27 NOTE — Progress Notes (Signed)
PROGRESS NOTE    Tom Bender  UJW:[id number] DOB: 1961-08-09 DOA: 03/23/2019 PCP: Patient, No Pcp Per    Brief Narrative: Tom Bender is a 58 y.o. male with medical history significant of alcohol abuse, tobacco abuse, chronic kidney disease stage II, hypertension, presenting with progressive weakness body aches headaches chest pain and shortness of breath over the last 1 to 2 weeks.  Patient also reported some dizziness and falls.  Also abdominal pain in the epigastric region which is rated as 8 out of 10.  It starts from the epigastric region and goes downwards towards the umbilicus. He was admitted for evaluation of SIRS.    Assessment & Plan:   Principal Problem:   SIRS (systemic inflammatory response syndrome) (HCC) Active Problems:   Hyponatremia   Hypokalemia   Benign essential HTN   Alcohol abuse   Lactic acidosis   Pancreatic cyst  SIRS:  Staph bacteremia vs Acute cholecystitis vs alcoholic hepatitis vs dehydration. Blood cultures show methicillin resistance staph 2/4, sensitivities reviewed. No respiratory symptoms. UA is negative. Pt reports some epigastric pain. Lipase levels wnl.  Lactic acid normalized. Gently hydrate with IV fluids.  MRI of the abdomen shows Cystic lesion within the pancreatic tail, favored to represent a pseudocyst. Indolent cystic neoplasm could look similar. Per onsensus criteria, this warrants follow-up with pre and post-contrast MRI/MRCP at 1 year. Cholelithiasis with suspicion of mild gallbladder wall thickening. No specific evidence of acute cholecystitis.  He is afebrile and wbc count is wnl.  He is on IV vancomycin.  Repeat cultures are pending negative so far.  Discussed with Dr Luciana Axeomer , plan to continue the IV antibiotics till the repeat cultures are negative, following which , transition to oral doxycycline or clindamycin.    Hypokalemia and hypomagnesemia:  Replaced.   Essential hypertension;  Pt reports he was on metoprolol at home ,  but ran out of the medication.  Normotensive at this time.    Alcohol abuse and withdrawal symptoms.;  Resolved.  On CIWA protocol.    Hyponatremia: resolved with hydration.    Dehydration:  Resolved.    Abdominal pain around the Umbilicus radiating to  Right  Upper quadrant today 2 hours post meals.  Differential include gastritis vs PUD vs Acute cholecystitis HIDA scan is negative. Discussed with surgery over the phone, recommended GI evaluation, and if non revealing, consult them.     DVT prophylaxis: heparin  Code Status: full code.  Family Communication: none at bedside.  Disposition Plan: SNF on discharge pending EGD, and resolution of his abdominal pain.   Consultants:   ID on the phone.   Gastroenterology Dr Dulce Sellarutlaw.     Procedures: MRI of the abdomen.  HIDA scan 03/27/2019 EGd 03/28/2019  Antimicrobials: vancomycin  For coag neg staph.   Subjective: VAGUE abdominal pain, intermittent.   Objective: Vitals:   03/26/19 1409 03/26/19 1956 03/27/19 0334 03/27/19 0345  BP: (!) 112/96 111/90 117/90   Pulse: 87 85 91   Resp: 15 (!) 22 (!) 21   Temp: 98 F (36.7 C) 98 F (36.7 C) 98.3 F (36.8 C)   TempSrc: Oral Oral Oral   SpO2: 99% 97% 97%   Weight:    78.1 kg  Height:        Intake/Output Summary (Last 24 hours) at 03/27/2019 1400 Last data filed at 03/27/2019 0330 Gross per 24 hour  Intake 911.23 ml  Output 2700 ml  Net -1788.77 ml   Filed Weights   03/25/19 0509 03/26/19  0263 03/27/19 0345  Weight: 82.1 kg 79.2 kg 78.1 kg    Examination:  General exam:mild distress from abd pain .  Respiratory system:  Bilateral air entry fair , no wheezing or rhonchi.  Cardiovascular system: S1 & S2 heard,  RRR, no JVD. Gastrointestinal system: Abdomen is soft, tender in the umbilical area, not distended.  Central nervous system: Alert and oriented. No focal neurological deficits. Extremities: Symmetric 5 x 5 power. Skin: No rashes, lesions or ulcers  Psychiatry:  Mood & affect appropriate.     Data Reviewed: I have personally reviewed following labs and imaging studies  CBC: Recent Labs  Lab 03/23/19 1606 03/24/19 0021 03/26/19 0436 03/27/19 1014  WBC 6.4 5.9 5.8 6.8  NEUTROABS 3.8  --   --   --   HGB 14.9 13.1 11.6* 12.0*  HCT 42.5 37.3* 33.7* 35.5*  MCV 104.7* 103.9* 107.3* 111.3*  PLT 137* 112* 94* 785*   Basic Metabolic Panel: Recent Labs  Lab 03/23/19 1606 03/24/19 0021 03/24/19 0950 03/25/19 1124 03/26/19 0436 03/27/19 1014  NA 133* 136  --  134* 135 135  K 3.4* 2.9* 2.7* 3.1* 3.7 4.1  CL 88* 97*  --  98 104 104  CO2 33* 28  --  27 25 26   GLUCOSE 126* 122*  --  117* 94 116*  BUN <5* <5*  --  <5* <5* <5*  CREATININE 0.82 0.73  --  0.63 0.56* 0.63  CALCIUM 8.5* 7.8*  --  7.7* 7.7* 8.2*  MG  --   --  1.4* 1.5* 1.8 1.8  PHOS  --   --   --  2.3* 2.8  --    GFR: Estimated Creatinine Clearance: 104.3 mL/min (by C-G formula based on SCr of 0.63 mg/dL). Liver Function Tests: Recent Labs  Lab 03/23/19 1606 03/24/19 0021 03/26/19 0436 03/27/19 1014  AST 170* 126* 78* 84*  ALT 59* 48* 33 32  ALKPHOS 166* 140* 110 113  BILITOT 3.8* 3.3* 1.1 0.9  PROT 6.8 5.9* 5.4* 5.5*  ALBUMIN 3.1* 2.5* 2.2* 2.4*   Recent Labs  Lab 03/24/19 0950  LIPASE 35   No results for input(s): AMMONIA in the last 168 hours. Coagulation Profile: No results for input(s): INR, PROTIME in the last 168 hours. Cardiac Enzymes: Recent Labs  Lab 03/23/19 1606  TROPONINI <0.03   BNP (last 3 results) No results for input(s): PROBNP in the last 8760 hours. HbA1C: No results for input(s): HGBA1C in the last 72 hours. CBG: No results for input(s): GLUCAP in the last 168 hours. Lipid Profile: No results for input(s): CHOL, HDL, LDLCALC, TRIG, CHOLHDL, LDLDIRECT in the last 72 hours. Thyroid Function Tests: No results for input(s): TSH, T4TOTAL, FREET4, T3FREE, THYROIDAB in the last 72 hours. Anemia Panel: No results for  input(s): VITAMINB12, FOLATE, FERRITIN, TIBC, IRON, RETICCTPCT in the last 72 hours. Sepsis Labs: Recent Labs  Lab 03/23/19 1606 03/23/19 1745 03/24/19 0950  LATICACIDVEN 4.7* 3.8* 1.7    Recent Results (from the past 240 hour(s))  Blood Culture (routine x 2)     Status: Abnormal   Collection Time: 03/23/19  4:00 PM  Result Value Ref Range Status   Specimen Description   Final    BLOOD RIGHT ANTECUBITAL Performed at [facility], [place] [address] [place], [place] [address]    Special Requests   Final    BOTTLES DRAWN AEROBIC AND ANAEROBIC Blood Culture adequate volume Performed at [facility], [address] [place], [place] [address]    Culture  Setup Time   Final    GRAM POSITIVE COCCI AEROBIC BOTTLE ONLY CRITICAL VALUE NOTED.  VALUE IS CONSISTENT WITH PREVIOUSLY REPORTED AND CALLED VALUE. GRAM POSITIVE RODS ANAEROBIC BOTTLE ONLY CRITICAL RESULT CALLED TO, READ BACK BY AND VERIFIED WITH: J. LEDFORD,PHARMD 2257 03/25/2019 T. TYSOR    Culture (A)  Final    STAPHYLOCOCCUS SPECIES (COAGULASE NEGATIVE) SUSCEPTIBILITIES PERFORMED ON PREVIOUS CULTURE WITHIN THE LAST 5 DAYS. BACILLUS SPECIES Standardized susceptibility testing for this organism is not available. Performed at [facility], [address] [place], [place] [address]    Report Status 03/26/2019 FINAL  Final  Urine culture     Status: None   Collection Time: 03/23/19  4:06 PM  Result Value Ref Range Status   Specimen Description   Final    URINE, CLEAN CATCH Performed at [facility] [place], [address] [place], [place] [address]    Special Requests   Final    NONE Performed at [facility] [place], [address] [place], [place] [address]    Culture   Final    NO GROWTH Performed at [facility], [address] [place], [place] [address]    Report Status 03/25/2019 FINAL  Final  Blood Culture (routine x 2)     Status: Abnormal   Collection Time: 03/23/19  5:00  PM  Result Value Ref Range Status   Specimen Description   Final    BLOOD RIGHT FOREARM Performed at [facility] [place], [address] [place], [place] [address]    Special Requests   Final    BOTTLES DRAWN AEROBIC AND ANAEROBIC Blood Culture adequate volume Performed at [facility] [place], [address] [place], [place] [address]    Culture  Setup Time   Final    GRAM POSITIVE COCCI AEROBIC BOTTLE ONLY CRITICAL RESULT CALLED TO, READ BACK BY AND VERIFIED WITH: C WHITESIDE AT 1400 ON [id number] BY SJW Performed at [facility], [address] [place], [place] [address]    Culture STAPHYLOCOCCUS SPECIES (COAGULASE NEGATIVE) (A)  Final   Report Status 03/26/2019 FINAL  Final   Organism ID, Bacteria STAPHYLOCOCCUS SPECIES (COAGULASE NEGATIVE)  Final      Susceptibility   Staphylococcus species (coagulase negative) - MIC*    CIPROFLOXACIN <=0.5 SENSITIVE Sensitive     ERYTHROMYCIN <=0.25 SENSITIVE Sensitive     GENTAMICIN <=0.5 SENSITIVE Sensitive     OXACILLIN <=0.25 SENSITIVE Sensitive     TETRACYCLINE <=1 SENSITIVE Sensitive     VANCOMYCIN 1 SENSITIVE Sensitive     TRIMETH/SULFA <=10 SENSITIVE Sensitive     CLINDAMYCIN <=0.25 SENSITIVE Sensitive     RIFAMPIN <=0.5 SENSITIVE Sensitive     Inducible Clindamycin NEGATIVE Sensitive     * STAPHYLOCOCCUS SPECIES (COAGULASE NEGATIVE)  Blood Culture ID Panel (Reflexed)     Status: Abnormal   Collection Time: 03/23/19  5:00 PM  Result Value Ref Range Status   Enterococcus species NOT DETECTED NOT DETECTED Final   Listeria monocytogenes NOT DETECTED NOT DETECTED Final   Staphylococcus species DETECTED (A) NOT DETECTED Final    Comment: Methicillin (oxacillin) resistant coagulase negative staphylococcus. Possible blood culture contaminant (unless isolated from more than one blood culture draw or clinical case suggests pathogenicity). No antibiotic treatment is indicated for blood  culture contaminants. CRITICAL RESULT  CALLED TO, READ BACK BY AND VERIFIED WITH: [facility] IncWHITESIDE,C AT  1400 ON [id number] BY SJW    Staphylococcus aureus (BCID) NOT DETECTED NOT DETECTED Final   Methicillin resistance DETECTED (A) NOT DETECTED Final    Comment: CRITICAL RESULT CALLED TO, READ BACK BY AND VERIFIED WITH: WHITESIDE, C PHARMD AT 1400 ON [id number] BY SJW    Streptococcus species NOT DETECTED NOT DETECTED Final   Streptococcus agalactiae NOT DETECTED NOT DETECTED Final   Streptococcus pneumoniae NOT DETECTED NOT DETECTED Final   Streptococcus pyogenes NOT DETECTED NOT DETECTED Final   Acinetobacter baumannii NOT DETECTED NOT DETECTED Final   Enterobacteriaceae species NOT DETECTED NOT DETECTED Final   Enterobacter cloacae complex NOT DETECTED NOT DETECTED Final   Escherichia coli NOT DETECTED NOT DETECTED Final   Klebsiella oxytoca NOT DETECTED NOT DETECTED Final   Klebsiella pneumoniae NOT DETECTED NOT DETECTED Final   Proteus species NOT DETECTED NOT DETECTED Final   Serratia marcescens NOT DETECTED NOT DETECTED Final   Haemophilus influenzae NOT DETECTED NOT DETECTED Final   Neisseria meningitidis NOT DETECTED NOT DETECTED Final   Pseudomonas aeruginosa NOT DETECTED NOT DETECTED Final   Candida albicans NOT DETECTED NOT DETECTED Final   Candida glabrata NOT DETECTED NOT DETECTED Final   Candida krusei NOT DETECTED NOT DETECTED Final   Candida parapsilosis NOT DETECTED NOT DETECTED Final   Candida tropicalis NOT DETECTED NOT DETECTED Final    Comment: Performed at [facility], [address] [place], [place] [address]  SARS Coronavirus 2 (Hosp order,Performed in [facility] lab via [employer] ID)     Status: None   Collection Time: 03/23/19  5:41 PM  Result Value Ref Range Status   SARS Coronavirus 2 ([employer] ID Now) NEGATIVE NEGATIVE Final    Comment: (NOTE) Interpretive Result Comment(s): COVID 19 Positive SARS CoV 2 target nucleic acids are DETECTED. The SARS CoV 2 RNA is generally detectable in upper and lower  respiratory specimens during the acute phase of infection.  Positive results are indicative of active infection with SARS CoV 2.  Clinical correlation with patient history and other diagnostic information is necessary to determine patient infection status.  Positive results do not rule out bacterial infection or coinfection with other viruses. The expected result is Negative. COVID 19 Negative SARS CoV 2 target nucleic acids are NOT DETECTED. The SARS CoV 2 RNA is generally detectable in upper and lower respiratory specimens during the acute phase of infection.  Negative results do not preclude SARS CoV 2 infection, do not rule out coinfections with other pathogens, and should not be used as the sole basis for treatment or other patient management decisions.  Negative results must be combined with clinical  observations, patient history, and epidemiological information. The expected result is Negative. Invalid Presence or absence of SARS CoV 2 nucleic acids cannot be determined. Repeat testing was performed on the submitted specimen and repeated Invalid results were obtained.  If clinically indicated, additional testing on a new specimen with an alternate test methodology [id number]) is advised.  The SARS CoV 2 RNA is generally detectable in upper and lower respiratory specimens during the acute phase of infection. The expected result is Negative. Fact Sheet for Patients:  http://www.graves-ford.org/https://www.fda.gov/media/136524/download Fact Sheet for Healthcare Providers: EnviroConcern.sihttps://www.fda.gov/media/136523/download This test is not yet approved or cleared by the [place] FDA and has been authorized for detection and/or diagnosis of SARS CoV 2 by FDA under an Emergency Use Authorization (EUA).  This EUA will remain in effect (meaning this test can be used) for the duration of the COVID19  d eclaration under Section 564(b)(1) of the Act, 21 U.S.C. section [phone] 3(b)(1), unless the authorization is  terminated or revoked sooner. Performed at [facility], [address] [place], [place] [address]   SARS Coronavirus 2     Status: None   Collection Time: 03/24/19 12:55 AM  Result Value Ref Range Status   SARS Coronavirus 2 NOT DETECTED NOT DETECTED Final    Comment: (NOTE) SARS-CoV-2 target nucleic acids are NOT DETECTED. The SARS-CoV-2 RNA is generally detectable in upper and lower respiratory specimens during the acute phase of infection.  Negative  results do not preclude SARS-CoV-2 infection, do not rule out co-infections with other pathogens, and should not be used as the sole basis for treatment or other patient management decisions.  Negative results must be combined with clinical observations, patient history, and epidemiological information. The expected result is Not Detected. Fact Sheet for Patients: http://www.biofiredefense.com/wp-content/uploads/2020/03/BIOFIRE-COVID -19-patients.pdf Fact Sheet for Healthcare Providers: http://www.biofiredefense.com/wp-content/uploads/2020/03/BIOFIRE-COVID -19-hcp.pdf This test is not yet approved or cleared by the [place] and  has been authorized for detection and/or diagnosis of SARS-CoV-2 by FDA under an Emergency Use Authorization (EUA).  This EUA will remain in effec t (meaning this test can be used) for the duration of  the COVID-19 declaration under Section 564(b)(1) of the Act, 21 U.S.C. section 360bbb-3(b)(1), unless the authorization is terminated or revoked sooner. Performed at [facility], [address], [place], [place] [address]   Culture, blood (Routine X 2) w Reflex to ID Panel     Status: None (Preliminary result)   Collection Time: 03/25/19 11:36 AM  Result Value Ref Range Status   Specimen Description BLOOD LEFT ANTECUBITAL  Final   Special Requests AEROBIC BOTTLE ONLY Blood Culture adequate volume  Final   Culture   Final    NO GROWTH 2 DAYS Performed at [facility], [address], [place], [place] [address]    Report Status PENDING  Incomplete  Culture, blood (Routine X 2) w Reflex to ID Panel     Status: None (Preliminary result)   Collection Time: 03/25/19 11:41 AM  Result Value Ref Range Status   Specimen Description BLOOD RIGHT HAND  Final   Special Requests AEROBIC BOTTLE ONLY Blood Culture adequate volume  Final   Culture   Final    NO GROWTH 2 DAYS Performed at [facility], [address] [place], [place] [address]    Report Status PENDING  Incomplete         Radiology Studies: Dg Lumbar Spine 2-3 Views  Result Date: 03/26/2019 CLINICAL DATA:  Low back pain EXAM: LUMBAR SPINE - 2-3 VIEW COMPARISON:  03/23/2019 FINDINGS: Five lumbar type vertebral bodies are well visualized. Vertebral body height is well maintained. Mild osteophytic changes are noted similar to that seen on prior CT examination. No soft tissue abnormality is noted. IMPRESSION: Mild degenerative change without acute abnormality. Electronically Signed   By: Alcide Clever M.D.   On: 03/26/2019 15:44   Nm Hepato W/eject Fract  Result Date: 03/27/2019 CLINICAL DATA:  Epigastric pain EXAM: NUCLEAR MEDICINE HEPATOBILIARY IMAGING WITH GALLBLADDER EF TECHNIQUE: Sequential images of the abdomen were obtained out to 60 minutes following intravenous administration of radiopharmaceutical. After oral ingestion of Ensure, gallbladder ejection fraction was determined. At 60 min, normal ejection fraction is greater than 33%. RADIOPHARMACEUTICALS:  5.2 mCi Tc-89m  Choletec IV COMPARISON:  None. FINDINGS: Prompt uptake and biliary excretion of activity by the liver  is seen. Gallbladder activity is visualized, consistent with patency of cystic duct. Biliary activity passes into small bowel, consistent with patent common bile duct. Calculated gallbladder ejection fraction is 92%. (Normal gallbladder ejection fraction with Ensure is greater than 33%.) IMPRESSION: Normal uptake and excretion of  biliary tracer. Electronically Signed   By: Alcide Clever M.D.   On: 03/27/2019 10:17        Scheduled Meds: . folic acid  1 mg Oral Daily  . heparin  5,000 Units Subcutaneous Q8H  . multivitamin with minerals  1 tablet Oral Daily  . nicotine  21 mg Transdermal Daily  . pantoprazole  40 mg Oral Daily  . potassium & sodium phosphates  1 packet Oral TID WC & HS  . thiamine  100 mg Oral Daily   Continuous Infusions: . vancomycin 750 mg (03/27/19 1023)     LOS: 3 days    Time spent: 30 minutes.     Kathlen Mody, MD [facility] Pager [phone]   If 7PM-7AM, please contact night-coverage [email or web address] Password [email or web address] 03/27/2019, 2:00 PM

## 2019-03-28 ENCOUNTER — Encounter (HOSPITAL_COMMUNITY)
Admission: EM | Disposition: A | Discharge status: Home Health | Payer: Self-pay | Source: Home / Self Care | Attending: Family Medicine

## 2019-03-28 ENCOUNTER — Inpatient Hospital Stay (HOSPITAL_COMMUNITY): Payer: Self-pay | Admitting: Certified Registered"

## 2019-03-28 ENCOUNTER — Encounter (HOSPITAL_COMMUNITY): Payer: Self-pay | Admitting: *Deleted

## 2019-03-28 DIAGNOSIS — R7881 Bacteremia: Secondary | ICD-10-CM

## 2019-03-28 DIAGNOSIS — R1011 Right upper quadrant pain: Secondary | ICD-10-CM

## 2019-03-28 HISTORY — PX: BIOPSY: SHX5522

## 2019-03-28 HISTORY — PX: ESOPHAGOGASTRODUODENOSCOPY (EGD) WITH PROPOFOL: SHX5813

## 2019-03-28 SURGERY — ESOPHAGOGASTRODUODENOSCOPY (EGD) WITH PROPOFOL
Anesthesia: Monitor Anesthesia Care | Laterality: Left

## 2019-03-28 MED ORDER — LIDOCAINE 2% (20 MG/ML) 5 ML SYRINGE
INTRAMUSCULAR | Status: DC | PRN
  Administered 2019-03-28: 60 mg via INTRAVENOUS

## 2019-03-28 MED ORDER — GLYCOPYRROLATE 0.2 MG/ML IJ SOLN
INTRAMUSCULAR | Status: DC | PRN
  Administered 2019-03-28: .1 mg via INTRAVENOUS

## 2019-03-28 MED ORDER — LACTATED RINGERS IV SOLN
INTRAVENOUS | Status: DC
  Administered 2019-03-28: 1000 mL via INTRAVENOUS

## 2019-03-28 MED ORDER — PROPOFOL 500 MG/50ML IV EMUL
INTRAVENOUS | Status: DC | PRN
  Administered 2019-03-28: 100 ug/kg/min via INTRAVENOUS

## 2019-03-28 MED ORDER — PROPOFOL 10 MG/ML IV BOLUS
INTRAVENOUS | Status: DC | PRN
  Administered 2019-03-28: 30 mg via INTRAVENOUS
  Administered 2019-03-28: 80 mg via INTRAVENOUS
  Administered 2019-03-28: 30 mg via INTRAVENOUS

## 2019-03-28 MED ORDER — SODIUM CHLORIDE 0.9 % IV SOLN: INTRAVENOUS | Status: DC

## 2019-03-28 SURGICAL SUPPLY — 15 items

## 2019-03-28 NOTE — Interval H&P Note (Signed)
History and Physical Interval Note:  03/28/2019 9:41 AM  Tom Bender  has presented today for surgery, with the diagnosis of abdominal pain.  The various methods of treatment have been discussed with the patient and family. After consideration of risks, benefits and other options for treatment, the patient has consented to  Procedure(s): ESOPHAGOGASTRODUODENOSCOPY (EGD) WITH PROPOFOL (Left) as a surgical intervention.  The patient's history has been reviewed, patient examined, no change in status, stable for surgery.  I have reviewed the patient's chart and labs.  Questions were answered to the patient's satisfaction.     [name]

## 2019-03-28 NOTE — Transfer of Care (Signed)
Immediate Anesthesia Transfer of Care Note  Patient: Tom Bender  Procedure(s) Performed: ESOPHAGOGASTRODUODENOSCOPY (EGD) WITH PROPOFOL (Left ) BIOPSY  Patient Location: PACU  Anesthesia Type:MAC  Level of Consciousness: drowsy  Airway & Oxygen Therapy: Patient Spontanous Breathing and Patient connected to nasal cannula oxygen  Post-op Assessment: Report given to RN and Post -op Vital signs reviewed and stable  Post vital signs: Reviewed and stable  Last Vitals:  Vitals Value Taken Time  BP 91/59 03/28/2019 10:10 AM  Temp    Pulse 85 03/28/2019 10:12 AM  Resp 21 03/28/2019 10:12 AM  SpO2 98 % 03/28/2019 10:12 AM  Vitals shown include unvalidated device data.  Last Pain:  Vitals:   03/28/19 0930  TempSrc: Oral  PainSc: 9       Patients Stated Pain Goal: 0 (53/64/68 0321)  Complications: No apparent anesthesia complications

## 2019-03-28 NOTE — Anesthesia Postprocedure Evaluation (Signed)
Anesthesia Post Note  Patient: Tom Bender  Procedure(s) Performed: ESOPHAGOGASTRODUODENOSCOPY (EGD) WITH PROPOFOL (Left ) BIOPSY     Patient location during evaluation: PACU Anesthesia Type: MAC Level of consciousness: awake and alert and oriented Pain management: pain level controlled Vital Signs Assessment: post-procedure vital signs reviewed and stable Respiratory status: spontaneous breathing, nonlabored ventilation and respiratory function stable Cardiovascular status: stable and blood pressure returned to baseline Postop Assessment: no apparent nausea or vomiting Anesthetic complications: no    Last Vitals:  Vitals:   03/28/19 1020 03/28/19 1030  BP: 99/66 105/79  Pulse: 90 93  Resp: (!) 23 (!) 24  Temp:    SpO2: 94% 94%    Last Pain:  Vitals:   03/28/19 1020  TempSrc:   PainSc: 7                  [name]

## 2019-03-28 NOTE — Anesthesia Preprocedure Evaluation (Addendum)
Anesthesia Evaluation  Patient identified by MRN, date of birth, ID band Patient awake    Reviewed: Allergy & Precautions, NPO status , Patient's Chart, lab work & pertinent test results, reviewed documented beta blocker date and time   Airway Mallampati: II  TM Distance: >3 FB Neck ROM: Full    Dental  (+) Poor Dentition, Missing, Loose, Partial Upper   Pulmonary Current Smoker,    Pulmonary exam normal breath sounds clear to auscultation       Cardiovascular hypertension, Pt. on medications and Pt. on home beta blockers Normal cardiovascular exam Rhythm:Regular Rate:Normal  EKG 03/26/2019- NSR Echo 03/25/2019 1. The left ventricle has normal systolic function, with an ejection fraction of 55-60%. The cavity size was normal. Left ventricular diastolic Doppler parameters are consistent with pseudonormalization.  2. The right ventricle has normal systolic function. The cavity was normal. There is no increase in right ventricular wall thickness.  3. The interatrial septum was not assessed.  Has not taken metoprolol for last couple of months   Neuro/Psych PSYCHIATRIC DISORDERS negative neurological ROS     GI/Hepatic (+)     substance abuse  alcohol use, Abdominal pain Pancreatic cyst   Endo/Other  negative endocrine ROS  Renal/GU Renal InsufficiencyRenal disease  negative genitourinary   Musculoskeletal negative musculoskeletal ROS (+)   Abdominal   Peds  Hematology  (+) anemia , Thrombocytopenia   Anesthesia Other Findings   Reproductive/Obstetrics                            Anesthesia Physical Anesthesia Plan  ASA: III  Anesthesia Plan: MAC   Post-op Pain Management:    Induction: Intravenous  PONV Risk Score and Plan: 0 and Ondansetron and Propofol infusion  Airway Management Planned: Natural Airway and Nasal Cannula  Additional Equipment:   Intra-op Plan:   Post-operative  Plan:   Informed Consent: I have reviewed the patients History and Physical, chart, labs and discussed the procedure including the risks, benefits and alternatives for the proposed anesthesia with the patient or authorized representative who has indicated his/her understanding and acceptance.     Dental advisory given  Plan Discussed with: CRNA  Anesthesia Plan Comments:         Anesthesia Quick Evaluation

## 2019-03-28 NOTE — Op Note (Signed)
Tom Bender Patient Name: Tom Bender : 03/28/2019 MRN: [id number] Attending Tom Bender: Tom Bender , Tom Bender Bender of Birth: 06-Apr-1961 CSN: [id number] Age: [age] Admit Type: Inpatient Procedure:                Upper Tom Bender endoscopy Indications:              Epigastric abdominal pain, Abdominal pain in the                            right upper quadrant, Upper abdominal pain Providers:                Tom Bender, Tom Bender, Tom Bender, Tom Bender, Tom AlimentShayla                            Bender, Tom Bender Referring Tom Bender:             Tom Bender Medicines:                Monitored Anesthesia Care Complications:            No immediate complications. Estimated Blood Loss:     Estimated blood loss was minimal. Procedure:                Pre-Anesthesia Assessment:                           - Prior to the procedure, a History and Physical                            was performed, and patient medications and                            allergies were reviewed. The patient's tolerance of                            previous anesthesia was also reviewed. The risks                            and benefits of the procedure and the sedation                            options and risks were discussed with the patient.                            All questions were answered, and informed consent                            was obtained. Prior Anticoagulants: The patient has                            taken no previous anticoagulant or antiplatelet                            agents. ASA Grade Assessment: III - A patient with  severe systemic disease. After reviewing the risks                            and benefits, the patient was deemed in                            satisfactory condition to undergo the procedure.                           After obtaining informed consent, the endoscope was                            passed under direct vision. Throughout the                   procedure, the patient's blood pressure, pulse, and                            oxygen saturations were monitored continuously. The                            GIF-[id number] ([id number] Olympus gastroscope was                            introduced through the mouth, and advanced to the                            second part of duodenum. The upper Tom Bender endoscopy was                            accomplished without difficulty. The patient                            tolerated the procedure well. Scope In: Scope Out: Findings:      The examined esophagus was normal.      Patchy mild inflammation was found in the entire examined stomach.       Biopsies were taken with a cold forceps for histology.      The exam of the stomach was otherwise normal.      Diffuse nodular mucosa was found in the first portion of the duodenum       and in the second portion of the duodenum. Biopsies were taken with a       cold forceps for histology.      The exam of the duodenum was otherwise normal. Impression:               - Normal esophagus.                           - Gastritis. Biopsied.                           - Nodular mucosa in the second portion of the                            duodenum. Biopsied.                           -  No explanation for patient's abdominal pain seen                            on today's endoscopy. Gallbladder process is                            leading consideration. Moderate Sedation:      None Recommendation:           - Return patient to [name] for ongoing care.                           - Clear liquid diet today.                           - Continue present medications.                           - Await pathology results.                           - Surgical consult today for evaluation of possible                            gallbladder source of abdominal pain (patient has                            had negative HIDA scan, sludge/stones GB without GB                             wall thickening on CT (but no recent U/S, defer to                            surgeons re: order that or not)).                           Tom Bender Tom Bender will follow. Procedure Code(s):        --- Professional ---                           [phone], Esophagogastroduodenoscopy, flexible,                            transoral; with biopsy, single or multiple Diagnosis Code(s):        --- Professional ---                           K29.70, Gastritis, unspecified, without bleeding                           K31.89, Other diseases of stomach and duodenum                           R10.13, Epigastric pain                           R10.11, Right  upper quadrant pain                           R10.10, Upper abdominal pain, unspecified CPT copyright 2019 American Medical Association. All rights reserved. The codes documented in this report are preliminary and upon coder review may  be revised to meet current compliance requirements. Tom ModenaWilliam Arielle Bender, Tom Bender 03/28/2019 10:11:42 AM This report has been signed electronically. Number of Addenda: 0

## 2019-03-28 NOTE — Progress Notes (Signed)
PROGRESS NOTE    Tom Bender  JXB:[id number] DOB: 07-Feb-1961 DOA: 03/23/2019 PCP: Patient, No Pcp Per   Brief Narrative: Tom Bender is a 58 y.o. male with a history of alcohol and tobacco abuse, CKD stage II, hypertension. He presented secondary to weakness and aches and found to meet sepsis criteria with evidence of bacteremia. Abdominal pain is also present with concern for possible acute cholecystitis vs gastritis. GI on board. On IV Vancomycin.   Assessment & Plan:   Principal Problem:   SIRS (systemic inflammatory response syndrome) (HCC) Active Problems:   Hyponatremia   Hypokalemia   Benign essential HTN   Alcohol abuse   Lactic acidosis   Pancreatic cyst   Sepsis Bacteremia source. Present on admission.  Physiology improved.  Coagulase negative MR staph bacteremia Unknown source. 2/4 bottles. Repeat blood cultures (6/7) no growth to date. -Continue Vancomycin IV -Will touch base with ID prior to discharge for outpatient regimen and duration  Upper abdominal pain Concern for possible gallbladder etiology vs gastritis. HIDA scan obtained and was negative. MRCP significant for gallbladder wall thickening. GI consulted and is planning on EGD today. If unremarkable, plan for General surgery consult for cholecystectomy  Hypokalemia Hypomagnesemia Replete as needed  Essential hypertension Well controlled. Not on medications as an outpatient.  Alcohol abuse Alcohol withdrawal Withdrawal symptoms resolved.  Hyponatremia Resolved with IV fluids  Dehydration Resolved with IV fluids  Pancreatic tail lesion Favored to be a pseudocyst but indolent cystic neoplasm cannot be ruled out recommendations for pre and post-contrast MRI/MRCP in 1 year   DVT prophylaxis: Heparin subcu Code Status:   Code Status: Full Code Family Communication: None Disposition Plan: Discharge home pending GI workup   Consultants:   [facility]  Procedures:   None   Antimicrobials:  Vancomycin  Cefepime    Subjective: Some abdominal pain today. Improved with IV morphine  Objective: Vitals:   03/27/19 0345 03/28/19 0439 03/28/19 0853 03/28/19 0930  BP:  (!) 131/95 115/88   Pulse:  79 77 98  Resp:  20  (!) 22  Temp:  97.9 F (36.6 C) 98.3 F (36.8 C) 97.6 F (36.4 C)  TempSrc:  Oral Oral Oral  SpO2:  96% 93% 92%  Weight: 78.1 kg 76.9 kg    Height:        Intake/Output Summary (Last 24 hours) at 03/28/2019 0945 Last data filed at 03/28/2019 0500 Gross per 24 hour  Intake 672 ml  Output -  Net 672 ml   Filed Weights   03/26/19 0523 03/27/19 0345 03/28/19 0439  Weight: 79.2 kg 78.1 kg 76.9 kg    Examination:  General exam: Appears calm and comfortable Respiratory system: Clear to auscultation. Respiratory effort normal. Cardiovascular system: S1 & S2 heard, RRR. No murmurs, rubs, gallops or clicks. Gastrointestinal system: Abdomen is nondistended, soft and tender in upper right and middle quadrants. No organomegaly or masses felt. Normal bowel sounds heard. Central nervous system: Alert and oriented. No focal neurological deficits. Mild tremor. Extremities: No edema. No calf tenderness Skin: No cyanosis. No rashes Psychiatry: Judgement and insight appear normal. Mood & affect appropriate.     Data Reviewed: I have personally reviewed following labs and imaging studies  CBC: Recent Labs  Lab 03/23/19 1606 03/24/19 0021 03/26/19 0436 03/27/19 1014  WBC 6.4 5.9 5.8 6.8  NEUTROABS 3.8  --   --   --   HGB 14.9 13.1 11.6* 12.0*  HCT 42.5 37.3* 33.7* 35.5*  MCV 104.7* 103.9* 107.3*  111.3*  PLT 137* 112* 94* 102*   Basic Metabolic Panel: Recent Labs  Lab 03/23/19 1606 03/24/19 0021 03/24/19 0950 03/25/19 1124 03/26/19 0436 03/27/19 1014  NA 133* 136  --  134* 135 135  K 3.4* 2.9* 2.7* 3.1* 3.7 4.1  CL 88* 97*  --  98 104 104  CO2 33* 28  --  27 25 26   GLUCOSE 126* 122*  --  117* 94 116*  BUN <5* <5*  --  <5* <5*  <5*  CREATININE 0.82 0.73  --  0.63 0.56* 0.63  CALCIUM 8.5* 7.8*  --  7.7* 7.7* 8.2*  MG  --   --  1.4* 1.5* 1.8 1.8  PHOS  --   --   --  2.3* 2.8  --    GFR: Estimated Creatinine Clearance: 104.3 mL/min (by C-G formula based on SCr of 0.63 mg/dL). Liver Function Tests: Recent Labs  Lab 03/23/19 1606 03/24/19 0021 03/26/19 0436 03/27/19 1014  AST 170* 126* 78* 84*  ALT 59* 48* 33 32  ALKPHOS 166* 140* 110 113  BILITOT 3.8* 3.3* 1.1 0.9  PROT 6.8 5.9* 5.4* 5.5*  ALBUMIN 3.1* 2.5* 2.2* 2.4*   Recent Labs  Lab 03/24/19 0950  LIPASE 35   No results for input(s): AMMONIA in the last 168 hours. Coagulation Profile: No results for input(s): INR, PROTIME in the last 168 hours. Cardiac Enzymes: Recent Labs  Lab 03/23/19 1606  TROPONINI <0.03   BNP (last 3 results) No results for input(s): PROBNP in the last 8760 hours. HbA1C: No results for input(s): HGBA1C in the last 72 hours. CBG: No results for input(s): GLUCAP in the last 168 hours. Lipid Profile: No results for input(s): CHOL, HDL, LDLCALC, TRIG, CHOLHDL, LDLDIRECT in the last 72 hours. Thyroid Function Tests: No results for input(s): TSH, T4TOTAL, FREET4, T3FREE, THYROIDAB in the last 72 hours. Anemia Panel: No results for input(s): VITAMINB12, FOLATE, FERRITIN, TIBC, IRON, RETICCTPCT in the last 72 hours. Sepsis Labs: Recent Labs  Lab 03/23/19 1606 03/23/19 1745 03/24/19 0950  LATICACIDVEN 4.7* 3.8* 1.7    Recent Results (from the past 240 hour(s))  Blood Culture (routine x 2)     Status: Abnormal   Collection Time: 03/23/19  4:00 PM  Result Value Ref Range Status   Specimen Description   Final    BLOOD RIGHT ANTECUBITAL Performed at [facility], [address] [place], [place] [address]    Special Requests   Final    BOTTLES DRAWN AEROBIC AND ANAEROBIC Blood Culture adequate volume Performed at [facility] [place] [facility], [address] [place], [place] [address]    Culture  Setup Time    Final    GRAM POSITIVE COCCI AEROBIC BOTTLE ONLY CRITICAL VALUE NOTED.  VALUE IS CONSISTENT WITH PREVIOUSLY REPORTED AND CALLED VALUE. GRAM POSITIVE RODS ANAEROBIC BOTTLE ONLY CRITICAL RESULT CALLED TO, READ BACK BY AND VERIFIED WITH: J. LEDFORD,PHARMD 2257 03/25/2019 T. TYSOR    Culture (A)  Final    STAPHYLOCOCCUS SPECIES (COAGULASE NEGATIVE) SUSCEPTIBILITIES PERFORMED ON PREVIOUS CULTURE WITHIN THE LAST 5 DAYS. BACILLUS SPECIES Standardized susceptibility testing for this organism is not available. Performed at [facility], [address] [place], [place] [address]    Report Status 03/26/2019 FINAL  Final  Urine culture     Status: None   Collection Time: 03/23/19  4:06 PM  Result Value Ref Range Status   Specimen Description   Final    URINE, CLEAN CATCH  Performed at [facility], [address] [place], [place] [address]    Special Requests   Final    NONE Performed at [facility], [address] [place], [place] [address]    Culture   Final    NO GROWTH Performed at [facility], [place] [address] [employer] [place], [place] [address]    Report Status 03/25/2019 FINAL  Final  Blood Culture (routine x 2)     Status: Abnormal   Collection Time: 03/23/19  5:00 PM  Result Value Ref Range Status   Specimen Description   Final    BLOOD RIGHT FOREARM Performed at [facility], [address] [place], [place] [address]    Special Requests   Final    BOTTLES DRAWN AEROBIC AND ANAEROBIC Blood Culture adequate volume Performed at [facility], [address] [place], [place] [address]    Culture  Setup Time   Final    GRAM POSITIVE COCCI AEROBIC BOTTLE ONLY CRITICAL RESULT CALLED TO, READ BACK BY AND VERIFIED WITH: C WHITESIDE AT 1400 ON [id number] BY SJW Performed at [facility], [place] [address], [place], [place] [address]    Culture STAPHYLOCOCCUS SPECIES (COAGULASE NEGATIVE) (A)  Final   Report Status 03/26/2019 FINAL   Final   Organism ID, Bacteria STAPHYLOCOCCUS SPECIES (COAGULASE NEGATIVE)  Final      Susceptibility   Staphylococcus species (coagulase negative) - MIC*    CIPROFLOXACIN <=0.5 SENSITIVE Sensitive     ERYTHROMYCIN <=0.25 SENSITIVE Sensitive     GENTAMICIN <=0.5 SENSITIVE Sensitive     OXACILLIN <=0.25 SENSITIVE Sensitive     TETRACYCLINE <=1 SENSITIVE Sensitive     VANCOMYCIN 1 SENSITIVE Sensitive     TRIMETH/SULFA <=10 SENSITIVE Sensitive     CLINDAMYCIN <=0.25 SENSITIVE Sensitive     RIFAMPIN <=0.5 SENSITIVE Sensitive     Inducible Clindamycin NEGATIVE Sensitive     * STAPHYLOCOCCUS SPECIES (COAGULASE NEGATIVE)  Blood Culture ID Panel (Reflexed)     Status: Abnormal   Collection Time: 03/23/19  5:00 PM  Result Value Ref Range Status   Enterococcus species NOT DETECTED NOT DETECTED Final   Listeria monocytogenes NOT DETECTED NOT DETECTED Final   Staphylococcus species DETECTED (A) NOT DETECTED Final    Comment: Methicillin (oxacillin) resistant coagulase negative staphylococcus. Possible blood culture contaminant (unless isolated from more than one blood culture draw or clinical case suggests pathogenicity). No antibiotic treatment is indicated for blood  culture contaminants. CRITICAL RESULT CALLED TO, READ BACK BY AND VERIFIED WITH: WHITESIDE,C AT 1400 ON [id number] BY SJW    Staphylococcus aureus (BCID) NOT DETECTED NOT DETECTED Final   Methicillin resistance DETECTED (A) NOT DETECTED Final    Comment: CRITICAL RESULT CALLED TO, READ BACK BY AND VERIFIED WITH: WHITESIDE, C PHARMD AT 1400 ON [id number] BY SJW    Streptococcus species NOT DETECTED NOT DETECTED Final   Streptococcus agalactiae NOT DETECTED NOT DETECTED Final   Streptococcus pneumoniae NOT DETECTED NOT DETECTED Final   Streptococcus pyogenes NOT DETECTED NOT DETECTED Final   Acinetobacter baumannii NOT DETECTED NOT DETECTED Final   Enterobacteriaceae species NOT DETECTED NOT DETECTED Final   Enterobacter cloacae complex  NOT DETECTED NOT DETECTED Final   Escherichia coli NOT DETECTED NOT DETECTED Final   Klebsiella oxytoca NOT DETECTED NOT DETECTED Final   Klebsiella pneumoniae NOT DETECTED NOT DETECTED Final   Proteus species NOT DETECTED NOT DETECTED Final   Serratia  marcescens NOT DETECTED NOT DETECTED Final   Haemophilus influenzae NOT DETECTED NOT DETECTED Final   Neisseria meningitidis NOT DETECTED NOT DETECTED Final   Pseudomonas aeruginosa NOT DETECTED NOT DETECTED Final   Candida albicans NOT DETECTED NOT DETECTED Final   Candida glabrata NOT DETECTED NOT DETECTED Final   Candida krusei NOT DETECTED NOT DETECTED Final   Candida parapsilosis NOT DETECTED NOT DETECTED Final   Candida tropicalis NOT DETECTED NOT DETECTED Final    Comment: Performed at [facility], [address], [place], [place] [address]  SARS Coronavirus 2 (Hosp order,Performed in [facility] lab via [employer] ID)     Status: None   Collection Time: 03/23/19  5:41 PM  Result Value Ref Range Status   SARS Coronavirus 2 ([employer] ID Now) NEGATIVE NEGATIVE Final    Comment: (NOTE) Interpretive Result Comment(s): COVID 19 Positive SARS CoV 2 target nucleic acids are DETECTED. The SARS CoV 2 RNA is generally detectable in upper and lower respiratory specimens during the acute phase of infection.  Positive results are indicative of active infection with SARS CoV 2.  Clinical correlation with patient history and other diagnostic information is necessary to determine patient infection status.  Positive results do not rule out bacterial infection or coinfection with other viruses. The expected result is Negative. COVID 19 Negative SARS CoV 2 target nucleic acids are NOT DETECTED. The SARS CoV 2 RNA is generally detectable in upper and lower respiratory specimens during the acute phase of infection.  Negative results do not preclude SARS CoV 2 infection, do not rule out coinfections with other pathogens, and should not be used as  the sole basis for treatment or other patient management decisions.  Negative results must be combined with clinical  observations, patient history, and epidemiological information. The expected result is Negative. Invalid Presence or absence of SARS CoV 2 nucleic acids cannot be determined. Repeat testing was performed on the submitted specimen and repeated Invalid results were obtained.  If clinically indicated, additional testing on a new specimen with an alternate test methodology [phone]) is advised.  The SARS CoV 2 RNA is generally detectable in upper and lower respiratory specimens during the acute phase of infection. The expected result is Negative. Fact Sheet for Patients:  http://www.graves-ford.org/ Fact Sheet for Healthcare Providers: EnviroConcern.si This test is not yet approved or cleared by the [place] FDA and has been authorized for detection and/or diagnosis of SARS CoV 2 by FDA under an Emergency Use Authorization (EUA).  This EUA will remain in effect (meaning this test can be used) for the duration of the COVID19 d eclaration under Section 564(b)(1) of the Act, 21 U.S.C. section [phone] 3(b)(1), unless the authorization is terminated or revoked sooner. Performed at [facility], [address] [place], [place] [address]   SARS Coronavirus 2     Status: None   Collection Time: 03/24/19 12:55 AM  Result Value Ref Range Status   SARS Coronavirus 2 NOT DETECTED NOT DETECTED Final    Comment: (NOTE) SARS-CoV-2 target nucleic acids are NOT DETECTED. The SARS-CoV-2 RNA is generally detectable in upper and lower respiratory specimens during the acute phase of infection.  Negative  results do not preclude SARS-CoV-2 infection, do not rule out co-infections with other pathogens, and should not be used as the sole basis for treatment or other patient management decisions.  Negative results must be combined  with clinical observations, patient history, and epidemiological information. The expected result is Not  Detected. Fact Sheet for Patients: http://www.biofiredefense.com/wp-content/uploads/2020/03/BIOFIRE-COVID -19-patients.pdf Fact Sheet for Healthcare Providers: http://www.biofiredefense.com/wp-content/uploads/2020/03/BIOFIRE-COVID -19-hcp.pdf This test is not yet approved or cleared by the [place] FDA and  has been authorized for detection and/or diagnosis of SARS-CoV-2 by FDA under an Emergency Use Authorization (EUA).  This EUA will remain in effec t (meaning this test can be used) for the duration of  the COVID-19 declaration under Section 564(b)(1) of the Act, 21 U.S.C. section 360bbb-3(b)(1), unless the authorization is terminated or revoked sooner. Performed at [facility], [address] [place], [place] [address]   Culture, blood (Routine X 2) w Reflex to ID Panel     Status: None (Preliminary result)   Collection Time: 03/25/19 11:36 AM  Result Value Ref Range Status   Specimen Description BLOOD LEFT ANTECUBITAL  Final   Special Requests AEROBIC BOTTLE ONLY Blood Culture adequate volume  Final   Culture   Final    NO GROWTH 2 DAYS Performed at [facility], [address] [place], [place] [address]    Report Status PENDING  Incomplete  Culture, blood (Routine X 2) w Reflex to ID Panel     Status: None (Preliminary result)   Collection Time: 03/25/19 11:41 AM  Result Value Ref Range Status   Specimen Description BLOOD RIGHT HAND  Final   Special Requests AEROBIC BOTTLE ONLY Blood Culture adequate volume  Final   Culture   Final    NO GROWTH 2 DAYS Performed at [facility], [address] [place], [place] [address]    Report Status PENDING  Incomplete         Radiology Studies: Dg Lumbar Spine 2-3 Views  Result Date: 03/26/2019 CLINICAL DATA:  Low back pain EXAM: LUMBAR SPINE - 2-3 VIEW COMPARISON:  03/23/2019 FINDINGS: Five  lumbar type vertebral bodies are well visualized. Vertebral body height is well maintained. Mild osteophytic changes are noted similar to that seen on prior CT examination. No soft tissue abnormality is noted. IMPRESSION: Mild degenerative change without acute abnormality. Electronically Signed   By: Alcide CleverMark  Lukens M.D.   On: 03/26/2019 15:44   Nm Hepato W/eject Fract  Result Date: 03/27/2019 CLINICAL DATA:  Epigastric pain EXAM: NUCLEAR MEDICINE HEPATOBILIARY IMAGING WITH GALLBLADDER EF TECHNIQUE: Sequential images of the abdomen were obtained out to 60 minutes following intravenous administration of radiopharmaceutical. After oral ingestion of Ensure, gallbladder ejection fraction was determined. At 60 min, normal ejection fraction is greater than 33%. RADIOPHARMACEUTICALS:  5.2 mCi Tc-6888m  Choletec IV COMPARISON:  None. FINDINGS: Prompt uptake and biliary excretion of activity by the liver is seen. Gallbladder activity is visualized, consistent with patency of cystic duct. Biliary activity passes into small bowel, consistent with patent common bile duct. Calculated gallbladder ejection fraction is 92%. (Normal gallbladder ejection fraction with Ensure is greater than 33%.) IMPRESSION: Normal uptake and excretion of biliary tracer. Electronically Signed   By: Alcide CleverMark  Lukens M.D.   On: 03/27/2019 10:17        Scheduled Meds: . [MAR Hold] folic acid  1 mg Oral Daily  . [MAR Hold] heparin  5,000 Units Subcutaneous Q8H  . [MAR Hold] multivitamin with minerals  1 tablet Oral Daily  . [MAR Hold] nicotine  21 mg Transdermal Daily  . [MAR Hold] pantoprazole  40 mg Oral Daily  . [MAR Hold] potassium & sodium phosphates  1 packet Oral TID WC & HS  . [MAR Hold] thiamine  100 mg Oral Daily   Continuous  Infusions: . sodium chloride    . lactated ringers 1,000 mL (03/28/19 0929)  . [MAR Hold] vancomycin Stopped (03/28/19 0318)     LOS: 4 days     Jacquelin Hawking, MD [facility] 03/28/2019, 9:45 AM   If 7PM-7AM, please contact night-coverage [email or web address]

## 2019-03-28 NOTE — Consult Note (Signed)
[facility] Consult/Admission Note  Tom Bender Ambrocio 1961/04/18  [id number].    Requesting MD: Dr. Dulce Sellarutlaw Chief Complaint/Reason for Consult: RUQ abdominal pain  HPI:   Pt is 58 yo male with a hx of alcohol abuse, tobacco abuse, chronic kidney disease stage II, hypertension, who presented to the ED on 06/05 with progressive weakness body aches headaches chest pain and shortness of breath over the last 1 to 2 weeks. Pt workup revealed Staph bacteremia. Pt states he has had RUQ and epigastric abdominal pain for about 4 weeks. He does not remember exactly when it started or if it was associated with anything. The pain in his RUQ is constant, severe, radiates into his back, not associated with food, nothing makes it better or worse. The epigastric pain improves with eating. Associated nausea and dry heaves. He had a normal BM this am. He denies urinary issues. He came to the hospital cause he was having syncopal episodes with standing. HIDA negative, EGD showed gastritis, MCRP showed cholelithiasis with possible gallbladder wall thickening, hepatic steatosis, cystic lesion with in the pancreatic tail. We were asked to see for possible cholecystectomy. Pt is not on anticoagulation.   ROS:  Review of Systems  Constitutional: Negative for chills, diaphoresis and fever.  HENT: Negative for sore throat.   Respiratory: Negative for cough and shortness of breath.   Cardiovascular: Negative for chest pain.  Gastrointestinal: Positive for abdominal pain and nausea. Negative for blood in stool, constipation, diarrhea and vomiting.  Genitourinary: Negative for dysuria.  Skin: Negative for rash.  Neurological: Negative for dizziness and loss of consciousness.  All other systems reviewed and are negative.    History reviewed. No pertinent family history.  Past Medical History:  Diagnosis Date  . ETOH abuse   . Hypertension   . Liver disease   . Renal insufficiency     History reviewed.  No pertinent surgical history.  Social History:  reports that he has been smoking. He has been smoking about 1.50 packs per day. He has never used smokeless tobacco. He reports current alcohol use of about 12.0 standard drinks of alcohol per week. He reports previous drug use.  Allergies: No Known Allergies  No medications prior to admission.    Blood pressure 105/79, pulse 93, temperature 98.4 F (36.9 C), temperature source Temporal, resp. rate (!) 24, height 5\' 9"  (1.753 m), weight 76.9 kg, SpO2 94 %.  Physical Exam Vitals signs and nursing note reviewed.  Constitutional:      General: He is not in acute distress.    Appearance: He is normal weight. He is not toxic-appearing or diaphoretic.  HENT:     Head: Normocephalic and atraumatic.     Nose: Nose normal.     Mouth/Throat:     Lips: Pink.     Mouth: Mucous membranes are moist.  Eyes:     General: No scleral icterus.       Right eye: No discharge.        Left eye: No discharge.     Conjunctiva/sclera: Conjunctivae normal.     Pupils: Pupils are equal, round, and reactive to light.  Neck:     Musculoskeletal: Normal range of motion and neck supple.  Cardiovascular:     Rate and Rhythm: Normal rate and regular rhythm.     Heart sounds: Normal heart sounds. No murmur.  Pulmonary:     Effort: Pulmonary effort is normal. No respiratory distress.     Breath sounds: Normal breath sounds.  No wheezing, rhonchi or rales.  Abdominal:     General: Bowel sounds are normal. There is no distension.     Palpations: Abdomen is soft. Abdomen is not rigid. There is no hepatomegaly or splenomegaly.     Tenderness: There is abdominal tenderness in the right upper quadrant and epigastric area. There is guarding. Positive signs include Murphy's sign.     Comments: No abdominal surgical scars noted  Musculoskeletal: Normal range of motion.        General: No tenderness or deformity.  Skin:    General: Skin is warm and dry.     Findings:  No rash.  Neurological:     Mental Status: He is alert and oriented to person, place, and time.     Sensory: Sensation is intact.     Motor: Motor function is intact.  Psychiatric:        Mood and Affect: Mood normal.        Behavior: Behavior normal.     Results for orders placed or performed during the hospital encounter of 03/23/19 (from the past 48 hour(s))  CBC     Status: Abnormal   Collection Time: 03/27/19 10:14 AM  Result Value Ref Range   WBC 6.8 4.0 - 10.5 K/uL   RBC 3.19 (L) 4.22 - 5.81 MIL/uL   Hemoglobin 12.0 (L) 13.0 - 17.0 g/dL   HCT 35.5 (L) 39.0 - 52.0 %   MCV 111.3 (H) 80.0 - 100.0 fL   MCH 37.6 (H) 26.0 - 34.0 pg   MCHC 33.8 30.0 - 36.0 g/dL   RDW 16.1 (H) 11.5 - 15.5 %   Platelets 102 (L) 150 - 400 K/uL    Comment: REPEATED TO VERIFY Immature Platelet Fraction may be clinically indicated, consider ordering this additional test [id number] CONSISTENT WITH PREVIOUS RESULT    nRBC 0.0 0.0 - 0.2 %    Comment: Performed at [facility], [place] [address] [place], [place] [address]  Comprehensive metabolic panel     Status: Abnormal   Collection Time: 03/27/19 10:14 AM  Result Value Ref Range   Sodium 135 135 - 145 mmol/L   Potassium 4.1 3.5 - 5.1 mmol/L   Chloride 104 98 - 111 mmol/L   CO2 26 22 - 32 mmol/L   Glucose, Bld 116 (H) 70 - 99 mg/dL   BUN <5 (L) 6 - 20 mg/dL   Creatinine, Ser 0.63 0.61 - 1.24 mg/dL   Calcium 8.2 (L) 8.9 - 10.3 mg/dL   Total Protein 5.5 (L) 6.5 - 8.1 g/dL   Albumin 2.4 (L) 3.5 - 5.0 g/dL   AST 84 (H) 15 - 41 U/L   ALT 32 0 - 44 U/L   Alkaline Phosphatase 113 38 - 126 U/L   Total Bilirubin 0.9 0.3 - 1.2 mg/dL   GFR calc non Af Amer >60 >60 mL/min   GFR calc Af Amer >60 >60 mL/min   Anion gap 5 5 - 15    Comment: Performed at [facility] [address] [place], [place] [address]  Magnesium     Status: None   Collection Time: 03/27/19 10:14 AM  Result Value Ref Range   Magnesium 1.8 1.7 - 2.4 mg/dL    Comment:  Performed at [facility] [address] [place], [place] [address]   Dg Lumbar Spine 2-3 Views  Result Date: 03/26/2019 CLINICAL DATA:  Low back pain EXAM: LUMBAR SPINE - 2-3 VIEW COMPARISON:  03/23/2019  FINDINGS: Five lumbar type vertebral bodies are well visualized. Vertebral body height is well maintained. Mild osteophytic changes are noted similar to that seen on prior CT examination. No soft tissue abnormality is noted. IMPRESSION: Mild degenerative change without acute abnormality. Electronically Signed   By: Alcide CleverMark  Lukens M.D.   On: 03/26/2019 15:44   Nm Hepato W/eject Fract  Result Date: 03/27/2019 CLINICAL DATA:  Epigastric pain EXAM: NUCLEAR MEDICINE HEPATOBILIARY IMAGING WITH GALLBLADDER EF TECHNIQUE: Sequential images of the abdomen were obtained out to 60 minutes following intravenous administration of radiopharmaceutical. After oral ingestion of Ensure, gallbladder ejection fraction was determined. At 60 min, normal ejection fraction is greater than 33%. RADIOPHARMACEUTICALS:  5.2 mCi Tc-533m  Choletec IV COMPARISON:  None. FINDINGS: Prompt uptake and biliary excretion of activity by the liver is seen. Gallbladder activity is visualized, consistent with patency of cystic duct. Biliary activity passes into small bowel, consistent with patent common bile duct. Calculated gallbladder ejection fraction is 92%. (Normal gallbladder ejection fraction with Ensure is greater than 33%.) IMPRESSION: Normal uptake and excretion of biliary tracer. Electronically Signed   By: Alcide CleverMark  Lukens M.D.   On: 03/27/2019 10:17      Assessment/Plan Principal Problem:   SIRS (systemic inflammatory response syndrome) (HCC) Active Problems:   Hyponatremia   Hypokalemia   Benign essential HTN   Alcohol abuse   Lactic acidosis   Pancreatic cyst  RUQ abdominal pain - HIDA negative, EGD showed gastritis, MCRP showed cholelithiasis with possible gallbladder wall thickening, hepatic steatosis, cystic lesion  with in the pancreatic tail.  - AST slightly elevated, ALT WNL, Tbili 0.9, WBC 6.8 - pt is very tender on exam in the RUQ  - we will plan to take pt to the OR tomorrow for lap chole  - I discussed with pt that this may not relieve his pain and further workup may be needed  Thank you for the consult.    Jerre SimonJessica L Tristin Gladman, [facility] 03/28/2019, 12:23 PM Pager: [phone] Consults: [phone] Mon-Fri 7:00 am-4:30 pm Sat-Sun 7:00 am-11:30 am

## 2019-03-29 ENCOUNTER — Inpatient Hospital Stay (HOSPITAL_COMMUNITY): Payer: Self-pay | Admitting: Anesthesiology

## 2019-03-29 ENCOUNTER — Encounter (HOSPITAL_COMMUNITY): Payer: Self-pay | Admitting: *Deleted

## 2019-03-29 ENCOUNTER — Encounter (HOSPITAL_COMMUNITY)
Admission: EM | Disposition: A | Discharge status: Home Health | Payer: Self-pay | Source: Home / Self Care | Attending: Family Medicine

## 2019-03-29 ENCOUNTER — Inpatient Hospital Stay (HOSPITAL_COMMUNITY): Payer: Self-pay

## 2019-03-29 HISTORY — PX: CHOLECYSTECTOMY: SHX55

## 2019-03-29 LAB — SURGICAL PCR SCREEN
MRSA, PCR: NEGATIVE
Staphylococcus aureus: NEGATIVE

## 2019-03-29 SURGERY — LAPAROSCOPIC CHOLECYSTECTOMY WITH INTRAOPERATIVE CHOLANGIOGRAM
Anesthesia: General | Site: Abdomen

## 2019-03-29 MED ORDER — PROPOFOL 10 MG/ML IV BOLUS
INTRAVENOUS | Status: DC | PRN
  Administered 2019-03-29: 160 mg via INTRAVENOUS

## 2019-03-29 MED ORDER — ONDANSETRON HCL 4 MG/2ML IJ SOLN
INTRAMUSCULAR | Status: AC
  Filled 2019-03-29: qty 2

## 2019-03-29 MED ORDER — SODIUM CHLORIDE 0.9 % IV SOLN
INTRAVENOUS | Status: DC | PRN
  Administered 2019-03-29: 7 mL

## 2019-03-29 MED ORDER — LIDOCAINE 2% (20 MG/ML) 5 ML SYRINGE
INTRAMUSCULAR | Status: AC
  Filled 2019-03-29: qty 5

## 2019-03-29 MED ORDER — HEPARIN SODIUM (PORCINE) 5000 UNIT/ML IJ SOLN
5000.0000 [IU] | Freq: Three times a day (TID) | INTRAMUSCULAR | Status: DC
  Administered 2019-03-29 – 2019-04-04 (×16): 5000 [IU] via SUBCUTANEOUS
  Filled 2019-03-29 (×16): qty 1

## 2019-03-29 MED ORDER — MIDAZOLAM HCL 5 MG/5ML IJ SOLN
INTRAMUSCULAR | Status: DC | PRN
  Administered 2019-03-29: 2 mg via INTRAVENOUS

## 2019-03-29 MED ORDER — ACETAMINOPHEN 325 MG PO TABS
650.0000 mg | ORAL_TABLET | Freq: Four times a day (QID) | ORAL | Status: DC | PRN
  Administered 2019-03-29 – 2019-04-05 (×8): 650 mg via ORAL
  Filled 2019-03-29 (×8): qty 2

## 2019-03-29 MED ORDER — SUCCINYLCHOLINE CHLORIDE 20 MG/ML IJ SOLN
INTRAMUSCULAR | Status: DC | PRN
  Administered 2019-03-29: 120 mg via INTRAVENOUS

## 2019-03-29 MED ORDER — SUCCINYLCHOLINE CHLORIDE 200 MG/10ML IV SOSY
PREFILLED_SYRINGE | INTRAVENOUS | Status: AC
  Filled 2019-03-29: qty 10

## 2019-03-29 MED ORDER — VANCOMYCIN HCL IN DEXTROSE 750-5 MG/150ML-% IV SOLN
750.0000 mg | Freq: Three times a day (TID) | INTRAVENOUS | Status: DC
  Administered 2019-03-29 – 2019-03-31 (×7): 750 mg via INTRAVENOUS
  Filled 2019-03-29 (×8): qty 150

## 2019-03-29 MED ORDER — SUGAMMADEX SODIUM 200 MG/2ML IV SOLN
INTRAVENOUS | Status: DC | PRN
  Administered 2019-03-29: 200 mg via INTRAVENOUS

## 2019-03-29 MED ORDER — FENTANYL CITRATE (PF) 100 MCG/2ML IJ SOLN
INTRAMUSCULAR | Status: DC | PRN
  Administered 2019-03-29 (×3): 50 ug via INTRAVENOUS

## 2019-03-29 MED ORDER — PHENYLEPHRINE HCL (PRESSORS) 10 MG/ML IV SOLN
INTRAVENOUS | Status: DC | PRN
  Administered 2019-03-29: 120 ug via INTRAVENOUS
  Administered 2019-03-29: 160 ug via INTRAVENOUS

## 2019-03-29 MED ORDER — ROCURONIUM BROMIDE 10 MG/ML (PF) SYRINGE
PREFILLED_SYRINGE | INTRAVENOUS | Status: AC
  Filled 2019-03-29: qty 10

## 2019-03-29 MED ORDER — PROMETHAZINE HCL 25 MG/ML IJ SOLN: 6.2500 mg | INTRAMUSCULAR | Status: DC | PRN

## 2019-03-29 MED ORDER — PHENYLEPHRINE 40 MCG/ML (10ML) SYRINGE FOR IV PUSH (FOR BLOOD PRESSURE SUPPORT)
PREFILLED_SYRINGE | INTRAVENOUS | Status: AC
  Filled 2019-03-29: qty 10

## 2019-03-29 MED ORDER — ONDANSETRON HCL 4 MG/2ML IJ SOLN
INTRAMUSCULAR | Status: DC | PRN
  Administered 2019-03-29: 4 mg via INTRAVENOUS

## 2019-03-29 MED ORDER — MEPERIDINE HCL 25 MG/ML IJ SOLN: 6.2500 mg | INTRAMUSCULAR | Status: DC | PRN

## 2019-03-29 MED ORDER — PROPOFOL 10 MG/ML IV BOLUS
INTRAVENOUS | Status: AC
  Filled 2019-03-29: qty 20

## 2019-03-29 MED ORDER — 0.9 % SODIUM CHLORIDE (POUR BTL) OPTIME
TOPICAL | Status: DC | PRN
  Administered 2019-03-29: 1000 mL

## 2019-03-29 MED ORDER — OXYCODONE HCL 5 MG PO TABS: 5.0000 mg | ORAL_TABLET | Freq: Once | ORAL | Status: DC | PRN

## 2019-03-29 MED ORDER — MIDAZOLAM HCL 2 MG/2ML IJ SOLN
INTRAMUSCULAR | Status: AC
  Filled 2019-03-29: qty 2

## 2019-03-29 MED ORDER — SODIUM CHLORIDE 0.9 % IR SOLN
Status: DC | PRN
  Administered 2019-03-29: 1000 mL

## 2019-03-29 MED ORDER — ROCURONIUM BROMIDE 50 MG/5ML IV SOSY
PREFILLED_SYRINGE | INTRAVENOUS | Status: DC | PRN
  Administered 2019-03-29: 50 mg via INTRAVENOUS

## 2019-03-29 MED ORDER — HYDROMORPHONE HCL 1 MG/ML IJ SOLN
0.2500 mg | INTRAMUSCULAR | Status: DC | PRN
  Administered 2019-03-29: 0.25 mg via INTRAVENOUS

## 2019-03-29 MED ORDER — OXYCODONE HCL 5 MG PO TABS
5.0000 mg | ORAL_TABLET | ORAL | Status: DC | PRN
  Administered 2019-03-29 – 2019-04-04 (×28): 10 mg via ORAL
  Administered 2019-04-05: 5 mg via ORAL
  Filled 2019-03-29 (×21): qty 2
  Filled 2019-03-29: qty 1
  Filled 2019-03-29 (×6): qty 2

## 2019-03-29 MED ORDER — DEXAMETHASONE SODIUM PHOSPHATE 10 MG/ML IJ SOLN
INTRAMUSCULAR | Status: DC | PRN
  Administered 2019-03-29: 10 mg via INTRAVENOUS

## 2019-03-29 MED ORDER — OXYCODONE HCL 5 MG/5ML PO SOLN: 5.0000 mg | Freq: Once | ORAL | Status: DC | PRN

## 2019-03-29 MED ORDER — HYDROMORPHONE HCL 1 MG/ML IJ SOLN
INTRAMUSCULAR | Status: AC
  Filled 2019-03-29: qty 1

## 2019-03-29 MED ORDER — LACTATED RINGERS IV SOLN
INTRAVENOUS | Status: DC
  Administered 2019-03-29 (×2): via INTRAVENOUS

## 2019-03-29 MED ORDER — LIDOCAINE 2% (20 MG/ML) 5 ML SYRINGE
INTRAMUSCULAR | Status: DC | PRN
  Administered 2019-03-29: 100 mg via INTRAVENOUS

## 2019-03-29 MED ORDER — DEXAMETHASONE SODIUM PHOSPHATE 10 MG/ML IJ SOLN
INTRAMUSCULAR | Status: AC
  Filled 2019-03-29: qty 1

## 2019-03-29 MED ORDER — OXYCODONE HCL 5 MG PO TABS
ORAL_TABLET | ORAL | Status: AC
  Filled 2019-03-29: qty 2

## 2019-03-29 MED ORDER — BUPIVACAINE-EPINEPHRINE 0.25% -1:200000 IJ SOLN
INTRAMUSCULAR | Status: DC | PRN
  Administered 2019-03-29: 14 mL

## 2019-03-29 MED ORDER — BUPIVACAINE-EPINEPHRINE (PF) 0.25% -1:200000 IJ SOLN
INTRAMUSCULAR | Status: AC
  Filled 2019-03-29: qty 30

## 2019-03-29 MED ORDER — FENTANYL CITRATE (PF) 250 MCG/5ML IJ SOLN
INTRAMUSCULAR | Status: AC
  Filled 2019-03-29: qty 5

## 2019-03-29 SURGICAL SUPPLY — 51 items
APPLIER CLIP ROT 10 11.4 M/L (STAPLE) ×3
BENZOIN TINCTURE PRP APPL 2/3 (GAUZE/BANDAGES/DRESSINGS) ×3 IMPLANT
BLADE CLIPPER SURG (BLADE) IMPLANT
CANISTER SUCT 3000ML PPV (MISCELLANEOUS) ×3 IMPLANT
CHLORAPREP W/TINT 26ML (MISCELLANEOUS) ×3 IMPLANT
CLIP APPLIE ROT 10 11.4 M/L (STAPLE) ×1 IMPLANT
CLOSURE WOUND 1/2 X4 (GAUZE/BANDAGES/DRESSINGS) ×1
COVER MAYO STAND STRL (DRAPES) ×3 IMPLANT
COVER SURGICAL LIGHT HANDLE (MISCELLANEOUS) ×3 IMPLANT
COVER WAND RF STERILE (DRAPES) ×3 IMPLANT
DRAPE C-ARM 42X72 X-RAY (DRAPES) ×3 IMPLANT
DRSG TEGADERM 2-3/8X2-3/4 SM (GAUZE/BANDAGES/DRESSINGS) ×9 IMPLANT
DRSG TEGADERM 4X4.75 (GAUZE/BANDAGES/DRESSINGS) ×3 IMPLANT
ELECT REM PT RETURN 9FT ADLT (ELECTROSURGICAL) ×3
ELECTRODE REM PT RTRN 9FT ADLT (ELECTROSURGICAL) ×1 IMPLANT
GAUZE SPONGE 2X2 8PLY STRL LF (GAUZE/BANDAGES/DRESSINGS) ×1 IMPLANT
GLOVE BIO SURGEON STRL SZ 6.5 (GLOVE) ×2 IMPLANT
GLOVE BIO SURGEON STRL SZ7 (GLOVE) ×6 IMPLANT
GLOVE BIO SURGEON STRL SZ7.5 (GLOVE) ×3 IMPLANT
GLOVE BIO SURGEONS STRL SZ 6.5 (GLOVE) ×1
GLOVE BIOGEL PI IND STRL 7.0 (GLOVE) ×3 IMPLANT
GLOVE BIOGEL PI IND STRL 7.5 (GLOVE) ×1 IMPLANT
GLOVE BIOGEL PI IND STRL 8 (GLOVE) ×1 IMPLANT
GLOVE BIOGEL PI INDICATOR 7.0 (GLOVE) ×6
GLOVE BIOGEL PI INDICATOR 7.5 (GLOVE) ×2
GLOVE BIOGEL PI INDICATOR 8 (GLOVE) ×2
GOWN STRL REUS W/ TWL LRG LVL3 (GOWN DISPOSABLE) ×3 IMPLANT
GOWN STRL REUS W/TWL LRG LVL3 (GOWN DISPOSABLE) ×6
KIT BASIN OR (CUSTOM PROCEDURE TRAY) ×3 IMPLANT
KIT TURNOVER KIT B (KITS) ×3 IMPLANT
NS IRRIG 1000ML POUR BTL (IV SOLUTION) ×3 IMPLANT
PAD ARMBOARD 7.5X6 YLW CONV (MISCELLANEOUS) ×3 IMPLANT
POUCH RETRIEVAL ECOSAC 10 (ENDOMECHANICALS) IMPLANT
POUCH RETRIEVAL ECOSAC 10MM (ENDOMECHANICALS)
POUCH SPECIMEN RETRIEVAL 10MM (ENDOMECHANICALS) IMPLANT
SCISSORS LAP 5X35 DISP (ENDOMECHANICALS) ×3 IMPLANT
SET CHOLANGIOGRAPH 5 50 .035 (SET/KITS/TRAYS/PACK) ×3 IMPLANT
SET IRRIG TUBING LAPAROSCOPIC (IRRIGATION / IRRIGATOR) ×3 IMPLANT
SET TUBE SMOKE EVAC HIGH FLOW (TUBING) ×3 IMPLANT
SLEEVE ENDOPATH XCEL 5M (ENDOMECHANICALS) ×3 IMPLANT
SPECIMEN JAR SMALL (MISCELLANEOUS) ×3 IMPLANT
SPONGE GAUZE 2X2 STER 10/PKG (GAUZE/BANDAGES/DRESSINGS) ×2
STRIP CLOSURE SKIN 1/2X4 (GAUZE/BANDAGES/DRESSINGS) ×2 IMPLANT
SUT MNCRL AB 4-0 PS2 18 (SUTURE) ×3 IMPLANT
TOWEL OR 17X24 6PK STRL BLUE (TOWEL DISPOSABLE) ×3 IMPLANT
TOWEL OR 17X26 10 PK STRL BLUE (TOWEL DISPOSABLE) ×3 IMPLANT
TRAY LAPAROSCOPIC MC (CUSTOM PROCEDURE TRAY) ×3 IMPLANT
TROCAR XCEL BLUNT TIP 100MML (ENDOMECHANICALS) ×3 IMPLANT
TROCAR XCEL NON-BLD 11X100MML (ENDOMECHANICALS) ×3 IMPLANT
TROCAR XCEL NON-BLD 5MMX100MML (ENDOMECHANICALS) ×3 IMPLANT
WATER STERILE IRR 1000ML POUR (IV SOLUTION) ×3 IMPLANT

## 2019-03-29 NOTE — Anesthesia Preprocedure Evaluation (Signed)
Anesthesia Evaluation  Patient identified by MRN, date of birth, ID band Patient awake    Reviewed: Allergy & Precautions, NPO status , Patient's Chart, lab work & pertinent test results, reviewed documented beta blocker date and time   Airway Mallampati: II  TM Distance: >3 FB Neck ROM: Full    Dental  (+) Poor Dentition, Missing, Loose, Partial Upper   Pulmonary Current Smoker,    Pulmonary exam normal breath sounds clear to auscultation       Cardiovascular hypertension, Pt. on medications and Pt. on home beta blockers Normal cardiovascular exam Rhythm:Regular Rate:Normal  EKG 03/26/2019- NSR Echo 03/25/2019 1. The left ventricle has normal systolic function, with an ejection fraction of 55-60%. The cavity size was normal. Left ventricular diastolic Doppler parameters are consistent with pseudonormalization.  2. The right ventricle has normal systolic function. The cavity was normal. There is no increase in right ventricular wall thickness.  3. The interatrial septum was not assessed.  Has not taken metoprolol for last couple of months   Neuro/Psych PSYCHIATRIC DISORDERS negative neurological ROS     GI/Hepatic (+)     substance abuse  alcohol use, Abdominal pain Pancreatic cyst   Endo/Other  negative endocrine ROS  Renal/GU Renal InsufficiencyRenal disease  negative genitourinary   Musculoskeletal negative musculoskeletal ROS (+)   Abdominal   Peds  Hematology  (+) anemia , Thrombocytopenia   Anesthesia Other Findings   Reproductive/Obstetrics                             Anesthesia Physical  Anesthesia Plan  ASA: III  Anesthesia Plan: General   Post-op Pain Management:    Induction: Intravenous  PONV Risk Score and Plan: 3 and Ondansetron, Dexamethasone and Treatment may vary due to age or medical condition  Airway Management Planned: Oral ETT  Additional Equipment:  None  Intra-op Plan:   Post-operative Plan: Extubation in OR  Informed Consent: I have reviewed the patients History and Physical, chart, labs and discussed the procedure including the risks, benefits and alternatives for the proposed anesthesia with the patient or authorized representative who has indicated his/her understanding and acceptance.     Dental advisory given  Plan Discussed with: CRNA  Anesthesia Plan Comments:         Anesthesia Quick Evaluation

## 2019-03-29 NOTE — Progress Notes (Signed)
Patient found on hourly checks with tears streaming down his face, asked if he was in pain and he said that he didn't want to bother me and that he's worried about the cost of his stay because he has no insurance. I gave him 2 MG IV Morphine for the pain and instructed to do calm deep breathing and that he could worry about finances later. Patient is going to the O/R 6/11 for lap chole, explained what was going to happen and patient seemed to feel better, will continue to monitor.

## 2019-03-29 NOTE — Progress Notes (Signed)
PROGRESS NOTE    Tom Bender  JYN:[id number] DOB: 07-30-1961 DOA: 03/23/2019 PCP: Patient, No Pcp Per   Brief Narrative: Tom Bender is a 58 y.o. male with a history of alcohol and tobacco abuse, CKD stage II, hypertension. He presented secondary to weakness and aches and found to meet sepsis criteria with evidence of bacteremia. Abdominal pain is also present with concern for possible acute cholecystitis vs gastritis. GI on board. On IV Vancomycin.   Assessment & Plan:   Principal Problem:   SIRS (systemic inflammatory response syndrome) (HCC) Active Problems:   Hyponatremia   Hypokalemia   Benign essential HTN   Alcohol abuse   Lactic acidosis   Pancreatic cyst   Sepsis Bacteremia source. Present on admission.  Physiology improved.  Coagulase negative MR staph bacteremia Unknown source. 2/4 bottles. Repeat blood cultures (6/7) no growth to date. -Continue Vancomycin IV and switch to doxycycline on discharge.  Upper abdominal pain Concern for possible gallbladder etiology vs gastritis. HIDA scan obtained and was negative. MRCP significant for gallbladder wall thickening. GI consulted and is planning on EGD today. If unremarkable, plan for General surgery consult for cholecystectomy.  Hypokalemia Hypomagnesemia Replete as needed.  Essential hypertension Well controlled. Not on medications as an outpatient.  Alcohol abuse Alcohol withdrawal Withdrawal symptoms resolved.  Hyponatremia Resolved with IV fluids.  Dehydration Resolved with IV fluids.  Pancreatic tail lesion Favored to be a pseudocyst but indolent cystic neoplasm cannot be ruled out recommendations for pre and post-contrast MRI/MRCP in 1 year.   DVT prophylaxis: Heparin subcu Code Status:   Code Status: Full Code Family Communication: None Disposition Plan: Discharge home pending GI workup   Consultants:   [facility]  Procedures:   None  Antimicrobials:  Vancomycin   Cefepime    Subjective: Some abdominal pain after surgery. Cannot tell if his previous abdominal pain is improved at this time.  Objective: Vitals:   03/29/19 0822 03/29/19 1030 03/29/19 1045 03/29/19 1100  BP:  108/75 100/68 101/71  Pulse:  (!) 120 (!) 106 94  Resp:  15 15 16   Temp:  97.9 F (36.6 C)  98.1 F (36.7 C)  TempSrc:      SpO2:  98% 94% 96%  Weight: 76.2 kg     Height: 5\' 9"  (1.753 m)       Intake/Output Summary (Last 24 hours) at 03/29/2019 1622 Last data filed at 03/29/2019 1005 Gross per 24 hour  Intake 1360 ml  Output 3105 ml  Net -1745 ml   Filed Weights   03/28/19 0439 03/29/19 0555 03/29/19 0822  Weight: 76.9 kg 76.2 kg 76.2 kg    Examination:  General exam: Appears calm and comfortable Respiratory system: Clear to auscultation. Respiratory effort normal. Cardiovascular system: S1 & S2 heard, RRR. No murmurs, rubs, gallops or clicks. Gastrointestinal system: Abdomen is nondistended, soft and tender. No organomegaly or masses felt. Normal bowel sounds heard. Central nervous system: Alert and oriented. No focal neurological deficits. Extremities: No edema. No calf tenderness Skin: No cyanosis. No rashes Psychiatry: Judgement and insight appear normal. Mood & affect appropriate.     Data Reviewed: I have personally reviewed following labs and imaging studies  CBC: Recent Labs  Lab 03/23/19 1606 03/24/19 0021 03/26/19 0436 03/27/19 1014  WBC 6.4 5.9 5.8 6.8  NEUTROABS 3.8  --   --   --   HGB 14.9 13.1 11.6* 12.0*  HCT 42.5 37.3* 33.7* 35.5*  MCV 104.7* 103.9* 107.3* 111.3*  PLT 137* 112* 94*  102*   Basic Metabolic Panel: Recent Labs  Lab 03/23/19 1606 03/24/19 0021 03/24/19 0950 03/25/19 1124 03/26/19 0436 03/27/19 1014  NA 133* 136  --  134* 135 135  K 3.4* 2.9* 2.7* 3.1* 3.7 4.1  CL 88* 97*  --  98 104 104  CO2 33* 28  --  27 25 26   GLUCOSE 126* 122*  --  117* 94 116*  BUN <5* <5*  --  <5* <5* <5*  CREATININE 0.82 0.73  --   0.63 0.56* 0.63  CALCIUM 8.5* 7.8*  --  7.7* 7.7* 8.2*  MG  --   --  1.4* 1.5* 1.8 1.8  PHOS  --   --   --  2.3* 2.8  --    GFR: Estimated Creatinine Clearance: 104.3 mL/min (by C-G formula based on SCr of 0.63 mg/dL). Liver Function Tests: Recent Labs  Lab 03/23/19 1606 03/24/19 0021 03/26/19 0436 03/27/19 1014  AST 170* 126* 78* 84*  ALT 59* 48* 33 32  ALKPHOS 166* 140* 110 113  BILITOT 3.8* 3.3* 1.1 0.9  PROT 6.8 5.9* 5.4* 5.5*  ALBUMIN 3.1* 2.5* 2.2* 2.4*   Recent Labs  Lab 03/24/19 0950  LIPASE 35   No results for input(s): AMMONIA in the last 168 hours. Coagulation Profile: No results for input(s): INR, PROTIME in the last 168 hours. Cardiac Enzymes: Recent Labs  Lab 03/23/19 1606  TROPONINI <0.03   BNP (last 3 results) No results for input(s): PROBNP in the last 8760 hours. HbA1C: No results for input(s): HGBA1C in the last 72 hours. CBG: No results for input(s): GLUCAP in the last 168 hours. Lipid Profile: No results for input(s): CHOL, HDL, LDLCALC, TRIG, CHOLHDL, LDLDIRECT in the last 72 hours. Thyroid Function Tests: No results for input(s): TSH, T4TOTAL, FREET4, T3FREE, THYROIDAB in the last 72 hours. Anemia Panel: No results for input(s): VITAMINB12, FOLATE, FERRITIN, TIBC, IRON, RETICCTPCT in the last 72 hours. Sepsis Labs: Recent Labs  Lab 03/23/19 1606 03/23/19 1745 03/24/19 0950  LATICACIDVEN 4.7* 3.8* 1.7    Recent Results (from the past 240 hour(s))  Blood Culture (routine x 2)     Status: Abnormal   Collection Time: 03/23/19  4:00 PM   Specimen: BLOOD  Result Value Ref Range Status   Specimen Description   Final    BLOOD RIGHT ANTECUBITAL Performed at [facility], [address] [place], [place] [address]    Special Requests   Final    BOTTLES DRAWN AEROBIC AND ANAEROBIC Blood Culture adequate volume Performed at [facility], [address] [place], [place] [address]    Culture  Setup Time   Final    GRAM  POSITIVE COCCI AEROBIC BOTTLE ONLY CRITICAL VALUE NOTED.  VALUE IS CONSISTENT WITH PREVIOUSLY REPORTED AND CALLED VALUE. GRAM POSITIVE RODS ANAEROBIC BOTTLE ONLY CRITICAL RESULT CALLED TO, READ BACK BY AND VERIFIED WITH: J. LEDFORD,PHARMD 2257 03/25/2019 T. TYSOR    Culture (A)  Final    STAPHYLOCOCCUS SPECIES (COAGULASE NEGATIVE) SUSCEPTIBILITIES PERFORMED ON PREVIOUS CULTURE WITHIN THE LAST 5 DAYS. BACILLUS SPECIES Standardized susceptibility testing for this organism is not available. Performed at [facility], [address] [place], [place] [address]    Report Status 03/26/2019 FINAL  Final  Urine culture     Status: None   Collection Time: 03/23/19  4:06 PM   Specimen: Urine, Clean Catch  Result Value Ref Range Status   Specimen Description   Final  URINE, CLEAN CATCH Performed at [facility], [address] [place], [place] [address]    Special Requests   Final    NONE Performed at [facility], [address] [place], [place] [address]    Culture   Final    NO GROWTH Performed at [facility], [address] [place], [place] [address]    Report Status 03/25/2019 FINAL  Final  Blood Culture (routine x 2)     Status: Abnormal   Collection Time: 03/23/19  5:00 PM   Specimen: BLOOD RIGHT FOREARM  Result Value Ref Range Status   Specimen Description   Final    BLOOD RIGHT FOREARM Performed at [facility], [address] [place], [place] [address]    Special Requests   Final    BOTTLES DRAWN AEROBIC AND ANAEROBIC Blood Culture adequate volume Performed at [facility], [address] [place], [place] [address]    Culture  Setup Time   Final    GRAM POSITIVE COCCI AEROBIC BOTTLE ONLY CRITICAL RESULT CALLED TO, READ BACK BY AND VERIFIED WITH: C WHITESIDE AT 1400 ON [id number] BY SJW Performed at [facility], [address], [place], [place] [address]    Culture STAPHYLOCOCCUS SPECIES (COAGULASE  NEGATIVE) (A)  Final   Report Status 03/26/2019 FINAL  Final   Organism ID, Bacteria STAPHYLOCOCCUS SPECIES (COAGULASE NEGATIVE)  Final      Susceptibility   Staphylococcus species (coagulase negative) - MIC*    CIPROFLOXACIN <=0.5 SENSITIVE Sensitive     ERYTHROMYCIN <=0.25 SENSITIVE Sensitive     GENTAMICIN <=0.5 SENSITIVE Sensitive     OXACILLIN <=0.25 SENSITIVE Sensitive     TETRACYCLINE <=1 SENSITIVE Sensitive     VANCOMYCIN 1 SENSITIVE Sensitive     TRIMETH/SULFA <=10 SENSITIVE Sensitive     CLINDAMYCIN <=0.25 SENSITIVE Sensitive     RIFAMPIN <=0.5 SENSITIVE Sensitive     Inducible Clindamycin NEGATIVE Sensitive     * STAPHYLOCOCCUS SPECIES (COAGULASE NEGATIVE)  Blood Culture ID Panel (Reflexed)     Status: Abnormal   Collection Time: 03/23/19  5:00 PM  Result Value Ref Range Status   Enterococcus species NOT DETECTED NOT DETECTED Final   Listeria monocytogenes NOT DETECTED NOT DETECTED Final   Staphylococcus species DETECTED (A) NOT DETECTED Final    Comment: Methicillin (oxacillin) resistant coagulase negative staphylococcus. Possible blood culture contaminant (unless isolated from more than one blood culture draw or clinical case suggests pathogenicity). No antibiotic treatment is indicated for blood  culture contaminants. CRITICAL RESULT CALLED TO, READ BACK BY AND VERIFIED WITH: WHITESIDE,C AT 1400 ON [id number] BY SJW    Staphylococcus aureus (BCID) NOT DETECTED NOT DETECTED Final   Methicillin resistance DETECTED (A) NOT DETECTED Final    Comment: CRITICAL RESULT CALLED TO, READ BACK BY AND VERIFIED WITH: WHITESIDE, C PHARMD AT 1400 ON [id number] BY SJW    Streptococcus species NOT DETECTED NOT DETECTED Final   Streptococcus agalactiae NOT DETECTED NOT DETECTED Final   Streptococcus pneumoniae NOT DETECTED NOT DETECTED Final   Streptococcus pyogenes NOT DETECTED NOT DETECTED Final   Acinetobacter baumannii NOT DETECTED NOT DETECTED Final   Enterobacteriaceae species NOT  DETECTED NOT DETECTED Final   Enterobacter cloacae complex NOT DETECTED NOT DETECTED Final   Escherichia coli NOT DETECTED NOT DETECTED Final   Klebsiella oxytoca NOT DETECTED NOT DETECTED Final   Klebsiella pneumoniae NOT DETECTED NOT DETECTED Final   Proteus  species NOT DETECTED NOT DETECTED Final   Serratia marcescens NOT DETECTED NOT DETECTED Final   Haemophilus influenzae NOT DETECTED NOT DETECTED Final   Neisseria meningitidis NOT DETECTED NOT DETECTED Final   Pseudomonas aeruginosa NOT DETECTED NOT DETECTED Final   Candida albicans NOT DETECTED NOT DETECTED Final   Candida glabrata NOT DETECTED NOT DETECTED Final   Candida krusei NOT DETECTED NOT DETECTED Final   Candida parapsilosis NOT DETECTED NOT DETECTED Final   Candida tropicalis NOT DETECTED NOT DETECTED Final    Comment: Performed at [facility], [address], [place], [place] [address]  SARS Coronavirus 2 (Hosp order,Performed in [facility] lab via [employer] ID)     Status: None   Collection Time: 03/23/19  5:41 PM   Specimen: Dry Nasal Swab (Abbott ID Now)  Result Value Ref Range Status   SARS Coronavirus 2 (Abbott ID Now) NEGATIVE NEGATIVE Final    Comment: (NOTE) Interpretive Result Comment(s): COVID 19 Positive SARS CoV 2 target nucleic acids are DETECTED. The SARS CoV 2 RNA is generally detectable in upper and lower respiratory specimens during the acute phase of infection.  Positive results are indicative of active infection with SARS CoV 2.  Clinical correlation with patient history and other diagnostic information is necessary to determine patient infection status.  Positive results do not rule out bacterial infection or coinfection with other viruses. The expected result is Negative. COVID 19 Negative SARS CoV 2 target nucleic acids are NOT DETECTED. The SARS CoV 2 RNA is generally detectable in upper and lower respiratory specimens during the acute phase of infection.  Negative results do not  preclude SARS CoV 2 infection, do not rule out coinfections with other pathogens, and should not be used as the sole basis for treatment or other patient management decisions.  Negative results must be combined with clinical  observations, patient history, and epidemiological information. The expected result is Negative. Invalid Presence or absence of SARS CoV 2 nucleic acids cannot be determined. Repeat testing was performed on the submitted specimen and repeated Invalid results were obtained.  If clinically indicated, additional testing on a new specimen with an alternate test methodology [phone]) is advised.  The SARS CoV 2 RNA is generally detectable in upper and lower respiratory specimens during the acute phase of infection. The expected result is Negative. Fact Sheet for Patients:  http://www.graves-ford.org/ Fact Sheet for Healthcare Providers: EnviroConcern.si This test is not yet approved or cleared by the [place] FDA and has been authorized for detection and/or diagnosis of SARS CoV 2 by FDA under an Emergency Use Authorization (EUA).  This EUA will remain in effect (meaning this test can be used) for the duration of the COVID19 d eclaration under Section 564(b)(1) of the Act, 21 U.S.C. section 838 855 2702 3(b)(1), unless the authorization is terminated or revoked sooner. Performed at [facility], [address] [place], [place] [address]   SARS Coronavirus 2     Status: None   Collection Time: 03/24/19 12:55 AM  Result Value Ref Range Status   SARS Coronavirus 2 NOT DETECTED NOT DETECTED Final    Comment: (NOTE) SARS-CoV-2 target nucleic acids are NOT DETECTED. The SARS-CoV-2 RNA is generally detectable in upper and lower respiratory specimens during the acute phase of infection.  Negative  results do not preclude SARS-CoV-2 infection, do not rule out co-infections with other pathogens, and should not be used as  the sole basis for treatment or other patient management decisions.  Negative results must be combined with clinical observations, patient history, and epidemiological information. The expected result is Not Detected. Fact Sheet for Patients: http://www.biofiredefense.com/wp-content/uploads/2020/03/BIOFIRE-COVID -19-patients.pdf Fact Sheet for Healthcare Providers: http://www.biofiredefense.com/wp-content/uploads/2020/03/BIOFIRE-COVID -19-hcp.pdf This test is not yet approved or cleared by the [place] and  has been authorized for detection and/or diagnosis of SARS-CoV-2 by FDA under an Emergency Use Authorization (EUA).  This EUA will remain in effec t (meaning this test can be used) for the duration of  the COVID-19 declaration under Section 564(b)(1) of the Act, 21 U.S.C. section 360bbb-3(b)(1), unless the authorization is terminated or revoked sooner. Performed at [facility], [place] [address], [place], [place] [address]   Culture, blood (Routine X 2) w Reflex to ID Panel     Status: None (Preliminary result)   Collection Time: 03/25/19 11:36 AM   Specimen: BLOOD  Result Value Ref Range Status   Specimen Description BLOOD LEFT ANTECUBITAL  Final   Special Requests AEROBIC BOTTLE ONLY Blood Culture adequate volume  Final   Culture   Final    NO GROWTH 4 DAYS Performed at [facility], [place] [address] [place], [place] [address]    Report Status PENDING  Incomplete  Culture, blood (Routine X 2) w Reflex to ID Panel     Status: None (Preliminary result)   Collection Time: 03/25/19 11:41 AM   Specimen: BLOOD RIGHT HAND  Result Value Ref Range Status   Specimen Description BLOOD RIGHT HAND  Final   Special Requests AEROBIC BOTTLE ONLY Blood Culture adequate volume  Final   Culture   Final    NO GROWTH 4 DAYS Performed at [facility], [place] [address], [place], [place] [address]    Report Status PENDING  Incomplete  Surgical pcr screen     Status:  None   Collection Time: 03/29/19  1:47 PM   Specimen: Nasal Mucosa; Nasal Swab  Result Value Ref Range Status   MRSA, PCR NEGATIVE NEGATIVE Final   Staphylococcus aureus NEGATIVE NEGATIVE Final    Comment: (NOTE) The Xpert SA Assay (FDA approved for NASAL specimens in patients 82 years of age and older), is one component of a comprehensive surveillance program. It is not intended to diagnose infection nor to guide or monitor treatment. Performed at [facility], [place] [address], [place], [place] [address]          Radiology Studies: Dg Cholangiogram Operative  Result Date: 03/29/2019 CLINICAL DATA:  58 year old male undergoing laparoscopic cholecystectomy EXAM: INTRAOPERATIVE CHOLANGIOGRAM TECHNIQUE: Cholangiographic images from the C-arm fluoroscopic device were submitted for interpretation post-operatively. Please see the procedural report for the amount of contrast and the fluoroscopy time utilized. COMPARISON:  Nuclear medicine HIDA scan 03/27/2019 FINDINGS: A single cine clip is submitted for review. The images demonstrate cannulation of the cystic duct remanent and opacification of the biliary tree. No evidence of biliary stenosis, stricture, choledocholithiasis or dilatation. The ampulla is patent with contrast material flowing freely into the duodenum. IMPRESSION: Negative intraoperative cholangiogram. Electronically Signed   By: Jacqulynn Cadet M.D.   On: 03/29/2019 11:03        Scheduled Meds: . folic acid  1 mg Oral Daily  . heparin  5,000 Units Subcutaneous Q8H  . HYDROmorphone      . multivitamin with minerals  1 tablet Oral Daily  . nicotine  21 mg Transdermal Daily  . oxyCODONE      . pantoprazole  40 mg Oral Daily  .  potassium & sodium phosphates  1 packet Oral TID WC & HS  . thiamine  100 mg Oral Daily   Continuous Infusions: . lactated ringers 10 mL/hr at 03/29/19 0825  . vancomycin       LOS: 5 days     Jacquelin Hawkingalph Shaterica Mcclatchy, MD [facility]  03/29/2019, 4:22 PM  If 7PM-7AM, please contact night-coverage [email or web address]

## 2019-03-29 NOTE — Transfer of Care (Signed)
Immediate Anesthesia Transfer of Care Note  Patient: Tom Bender  Procedure(s) Performed: LAPAROSCOPIC CHOLECYSTECTOMY WITH INTRAOPERATIVE CHOLANGIOGRAM (N/A Abdomen)  Patient Location: PACU  Anesthesia Type:General  Level of Consciousness: awake, alert  and oriented  Airway & Oxygen Therapy: Patient Spontanous Breathing and Patient connected to nasal cannula oxygen  Post-op Assessment: Report given to RN and Post -op Vital signs reviewed and stable  Post vital signs: Reviewed and stable  Last Vitals:  Vitals Value Taken Time  BP 108/75 03/29/19 1028  Temp    Pulse 118 03/29/19 1029  Resp 16 03/29/19 1029  SpO2 97 % 03/29/19 1029  Vitals shown include unvalidated device data.  Last Pain:  Vitals:   03/29/19 0555  TempSrc: Oral  PainSc:       Patients Stated Pain Goal: 0 (07/68/08 8110)  Complications: No apparent anesthesia complications

## 2019-03-29 NOTE — Op Note (Signed)
Laparoscopic Cholecystectomy with IOC Procedure Note  Indications: This patient presents with symptomatic gallbladder disease and will undergo laparoscopic cholecystectomy.  He has undergone extensive work-up which shows gallstones without acute cholecystitis.  Pre-operative Diagnosis: Calculus of gallbladder with other cholecystitis, without mention of obstruction  Post-operative Diagnosis: Same  Surgeon: Wynona LunaMatthew K Vannak Montenegro   Assistants: Leary RocaMichael Maczis, PA-C   Anesthesia: General endotracheal anesthesia  ASA Class: 2  Procedure Details  The patient was seen again in the Holding Room. The risks, benefits, complications, treatment options, and expected outcomes were discussed with the patient. The possibilities of reaction to medication, pulmonary aspiration, perforation of viscus, bleeding, recurrent infection, finding a normal gallbladder, the need for additional procedures, failure to diagnose a condition, the possible need to convert to an open procedure, and creating a complication requiring transfusion or operation were discussed with the patient. The likelihood of improving the patient's symptoms with return to their baseline status is good.  The patient and/or family concurred with the proposed plan, giving informed consent. The site of surgery properly noted. The patient was taken to Operating Room, identified as Tom Bender and the procedure verified as Laparoscopic Cholecystectomy with Intraoperative Cholangiogram. A Time Out was held and the above information confirmed.  Prior to the induction of general anesthesia, antibiotic prophylaxis was administered. General endotracheal anesthesia was then administered and tolerated well. After the induction, the abdomen was prepped with Chloraprep and draped in the sterile fashion. The patient was positioned in the supine position.  Local anesthetic agent was injected into the skin below the umbilicus and an incision made. We dissected down to  the abdominal fascia with blunt dissection.  The fascia was incised vertically and we entered the peritoneal cavity bluntly.  A pursestring suture of 0-Vicryl was placed around the fascial opening.  The Hasson cannula was inserted and secured with the stay suture.  Pneumoperitoneum was then created with CO2 and tolerated well without any adverse changes in the patient's vital signs. An 11-mm port was placed in the subxiphoid position.  Two 5-mm ports were placed in the right upper quadrant. All skin incisions were infiltrated with a local anesthetic agent before making the incision and placing the trocars.   We positioned the patient in reverse Trendelenburg, tilted slightly to the patient's left.  The gallbladder was identified, the fundus grasped and retracted cephalad. The gallbladder is quite distended and elongated.  There are minimal adhesions to the hilum of the gallbladder.  Adhesions were lysed bluntly and with the electrocautery where indicated, taking care not to injure any adjacent organs or viscus. The infundibulum was grasped and retracted laterally, exposing the peritoneum overlying the triangle of Calot. This was then divided and exposed in a blunt fashion. A critical view of the cystic duct and cystic artery was obtained.  The cystic duct was clearly identified and bluntly dissected circumferentially. The cystic duct was ligated with a clip distally.   An incision was made in the cystic duct and the [facility] catheter introduced. The catheter was secured using a clip. A cholangiogram was then obtained which showed good visualization of the distal and proximal biliary tree with no sign of filling defects or obstruction.  Contrast flowed easily into the duodenum. The catheter was then removed.   The cystic duct was then ligated with clips and divided. The cystic artery was identified, dissected free, ligated with clips and divided as well.   The gallbladder was dissected from the liver  bed in retrograde fashion with the  electrocautery. The gallbladder was removed and placed in an Endocatch sac. The liver bed was irrigated and inspected. Hemostasis was achieved with the electrocautery. Copious irrigation was utilized and was repeatedly aspirated until clear.  The gallbladder and Endocatch sac were then removed through the umbilical port site.  The pursestring suture was used to close the umbilical fascia.    We again inspected the right upper quadrant for hemostasis.  Pneumoperitoneum was released as we removed the trocars.  4-0 Monocryl was used to close the skin.   Benzoin, steri-strips, and clean dressings were applied. The patient was then extubated and brought to the recovery room in stable condition. Instrument, sponge, and needle counts were correct at closure and at the conclusion of the case.   Findings: Cholecystitis with Cholelithiasis  Estimated Blood Loss: Minimal         Drains: none         Specimens: Gallbladder           Complications: None; patient tolerated the procedure well.         Disposition: PACU - hemodynamically stable.         Condition: stable

## 2019-03-29 NOTE — Progress Notes (Signed)
Subjective/Chief Complaint: Patient still with significant epigastric/ RUQ pain.  Bloated Ready to have surgery   Objective: Vital signs in last 24 hours: Temp:  [97.6 F (36.4 C)-98.4 F (36.9 C)] 98.4 F (36.9 C) (06/11 0555) Pulse Rate:  [69-98] 86 (06/11 0555) Resp:  [17-24] 17 (06/11 0555) BP: (97-127)/(59-89) 127/88 (06/11 0555) SpO2:  [91 %-98 %] 94 % (06/11 0555) Weight:  [76.2 kg] 76.2 kg (06/11 0555) Last BM Date: 03/26/19  Intake/Output from previous day: 06/10 0701 - 06/11 0700 In: 560 [P.O.:360; I.V.:200] Out: 3700 [Urine:3700] Intake/Output this shift: No intake/output data recorded.  General appearance: alert, cooperative and no distress GI: soft, tender in RUQ; no peritonitis  Lab Results:  Recent Labs    03/27/19 1014  WBC 6.8  HGB 12.0*  HCT 35.5*  PLT 102*   BMET Recent Labs    03/27/19 1014  NA 135  K 4.1  CL 104  CO2 26  GLUCOSE 116*  BUN <5*  CREATININE 0.63  CALCIUM 8.2*   PT/INR No results for input(s): LABPROT, INR in the last 72 hours. ABG No results for input(s): PHART, HCO3 in the last 72 hours.  Invalid input(s): PCO2, PO2  Studies/Results: Nm Hepato W/eject Fract  Result Date: 03/27/2019 CLINICAL DATA:  Epigastric pain EXAM: NUCLEAR MEDICINE HEPATOBILIARY IMAGING WITH GALLBLADDER EF TECHNIQUE: Sequential images of the abdomen were obtained out to 60 minutes following intravenous administration of radiopharmaceutical. After oral ingestion of Ensure, gallbladder ejection fraction was determined. At 60 min, normal ejection fraction is greater than 33%. RADIOPHARMACEUTICALS:  5.2 mCi Tc-3238m  Choletec IV COMPARISON:  None. FINDINGS: Prompt uptake and biliary excretion of activity by the liver is seen. Gallbladder activity is visualized, consistent with patency of cystic duct. Biliary activity passes into small bowel, consistent with patent common bile duct. Calculated gallbladder ejection fraction is 92%. (Normal gallbladder  ejection fraction with Ensure is greater than 33%.) IMPRESSION: Normal uptake and excretion of biliary tracer. Electronically Signed   By: Alcide CleverMark  Lukens M.D.   On: 03/27/2019 10:17    Anti-infectives: Anti-infectives (From admission, onward)   Start     Dose/Rate Route Frequency Ordered Stop   03/24/19 0200  ceFEPIme (MAXIPIME) 2 g in sodium chloride 0.9 % 100 mL IVPB  Status:  Discontinued     2 g 200 mL/hr over 30 Minutes Intravenous Every 8 hours 03/23/19 1701 03/25/19 0725   03/24/19 0200  vancomycin (VANCOCIN) IVPB 750 mg/150 ml premix     750 mg 150 mL/hr over 60 Minutes Intravenous Every 8 hours 03/23/19 1701     03/23/19 1710  vancomycin (VANCOCIN) 500 MG powder    Note to Pharmacy: Estrella DeedsBarrow, Eliza   : cabinet override      03/23/19 1710 03/24/19 0514   03/23/19 1709  vancomycin (VANCOCIN) 1000 MG powder    Note to Pharmacy: Estrella DeedsBarrow, Eliza   : cabinet override      03/23/19 1709 03/24/19 0514   03/23/19 1709  ceFEPIme (MAXIPIME) 2 g injection    Note to Pharmacy: Estrella DeedsBarrow, Eliza   : cabinet override      03/23/19 1709 03/24/19 0514   03/23/19 1700  ceFEPIme (MAXIPIME) 2 g in sodium chloride 0.9 % 100 mL IVPB     2 g 200 mL/hr over 30 Minutes Intravenous  Once 03/23/19 1653 03/23/19 1825   03/23/19 1700  metroNIDAZOLE (FLAGYL) IVPB 500 mg     500 mg 100 mL/hr over 60 Minutes Intravenous  Once 03/23/19 1653 03/23/19  1800   03/23/19 1700  vancomycin (VANCOCIN) IVPB 1000 mg/200 mL premix  Status:  Discontinued     1,000 mg 200 mL/hr over 60 Minutes Intravenous  Once 03/23/19 1653 03/23/19 1659   03/23/19 1700  vancomycin (VANCOCIN) 1,500 mg in sodium chloride 0.9 % 500 mL IVPB     1,500 mg 250 mL/hr over 120 Minutes Intravenous  Once 03/23/19 1659 03/23/19 2021   03/23/19 1657  ceFEPIme (MAXIPIME) 2 g injection    Note to Pharmacy: Mena Pauls   : cabinet override      03/23/19 1657 03/24/19 0459      Assessment/Plan: Symptomatic cholelithiasis   Plan laparoscopic  cholecystectomy with intraoperative cholangiogram.  The surgical procedure has been discussed with the patient.  Potential risks, benefits, alternative treatments, and expected outcomes have been explained.  All of the patient's questions at this time have been answered.  The likelihood of reaching the patient's treatment goal is good.  The patient understand the proposed surgical procedure and wishes to proceed.  Imogene Burn. Georgette Dover, MD, [facility]  General/ Trauma Surgery Beeper [phone]  03/29/2019 7:57 AM   LOS: 5 days    Maia Petties 03/29/2019

## 2019-03-29 NOTE — Anesthesia Procedure Notes (Addendum)
Procedure Name: Intubation Date/Time: 03/29/2019 9:18 AM Performed by: Inda Coke, CRNA Pre-anesthesia Checklist: Patient identified, Emergency Drugs available, Suction available and Patient being monitored Patient Re-evaluated:Patient Re-evaluated prior to induction Oxygen Delivery Method: Circle System Utilized Preoxygenation: Pre-oxygenation with 100% oxygen Induction Type: IV induction and Rapid sequence Laryngoscope Size: Mac and 4 Grade View: Grade I Tube type: Oral Tube size: 7.5 mm Number of attempts: 1 Airway Equipment and Method: Stylet and Oral airway Placement Confirmation: ETT inserted through vocal cords under direct vision,  positive ETCO2 and breath sounds checked- equal and bilateral Secured at: 22 cm Tube secured with: Tape Dental Injury: Teeth and Oropharynx as per pre-operative assessment

## 2019-03-29 NOTE — Anesthesia Postprocedure Evaluation (Signed)
Anesthesia Post Note  Patient: Tom Bender  Procedure(s) Performed: LAPAROSCOPIC CHOLECYSTECTOMY WITH INTRAOPERATIVE CHOLANGIOGRAM (N/A Abdomen)     Patient location during evaluation: PACU Anesthesia Type: General Level of consciousness: sedated and patient cooperative Pain management: pain level controlled Vital Signs Assessment: post-procedure vital signs reviewed and stable Respiratory status: spontaneous breathing Cardiovascular status: stable Anesthetic complications: no    Last Vitals:  Vitals:   03/29/19 1045 03/29/19 1100  BP: 100/68 101/71  Pulse: (!) 106 94  Resp: 15 16  Temp:  36.7 C  SpO2: 94% 96%    Last Pain:  Vitals:   03/29/19 1100  TempSrc:   PainSc: [place]

## 2019-03-29 NOTE — Progress Notes (Signed)
OT Cancellation Note  Patient Details Name: Tom Bender MRN: [id number] DOB: January 08, 1961   Cancelled Treatment:    Reason Eval/Treat Not Completed: Patient at procedure or test/ unavailable. Pt off floor for cholecystectomy.  OT will follow up at later time.  Darrol Jump OTR/L [place] [phone] 03/29/2019, 9:02 AM

## 2019-03-29 NOTE — Progress Notes (Signed)
PT Cancellation Note  Patient Details Name: Rigo Letts MRN: [id number] DOB: 04/14/1961   Cancelled Treatment:    Reason Eval/Treat Not Completed: (P) Patient at procedure or test/unavailable Pt off floor for cholecystectomy. PT will follow back for treatment as able.  Janeva Peaster B. Migdalia Dk PT, DPT Acute Rehabilitation Services Pager [phone] Office [phone]    [place] 03/29/2019, 8:33 AM

## 2019-03-29 NOTE — Progress Notes (Signed)
Physical Therapy Treatment Patient Details Name: Tom Bender MRN: [id number] DOB: 1961-08-19 Today's Date: 03/29/2019    History of Present Illness 58 y.o. male with medical history significant of alcohol abuse, tobacco abuse, chronic kidney disease stage II, hypertension, presenting with progressive weakness body aches headaches chest pain and shortness of breath, weakness and falls, along with 8/10 abdominal pain. Pt admitted 6/5 for evaluation for SIRS. s/p cholecystectomy 6/11    PT Comments    Pt s/p cholecystectomy this am, willing to sit up in chair. Pt is limited in safe mobility by pain and related tachycardia with movement, as well as decreased safety awareness and decreased knowledge of DME, along with decreased strength and balance. Pt is min guard for bed mobility and transfers and minA for 18 feet ambulation with RW. D/c plans remain appropriate at this time. PT will continue to follow acutely.    Follow Up Recommendations  SNF     Equipment Recommendations  Other (comment)(TBD at next venue)    Recommendations for Other Services       Precautions / Restrictions Precautions Precautions: Fall Precaution Comments: watch HR, hx of falling 3 in last month Restrictions Weight Bearing Restrictions: No    Mobility  Bed Mobility Overal bed mobility: Needs Assistance Bed Mobility: Supine to Sit     Supine to sit: Min guard     General bed mobility comments: min guard for safety, increased time and effort required  Transfers Overall transfer level: Needs assistance Equipment used: Rolling walker (2 wheeled) Transfers: Sit to/from Stand Sit to Stand: Min guard         General transfer comment: min guard for power up and steadying, vc for waiting a moment prior to ambulation due to history of dizziness with standing  Ambulation/Gait Ambulation/Gait assistance: Min assist Gait Distance (Feet): 18 Feet Assistive device: Rolling walker (2 wheeled) Gait  Pattern/deviations: Step-through pattern;Decreased step length - right;Decreased step length - left;Trunk flexed Gait velocity: slowed   General Gait Details: minA for steadying, pt tremulous with gait, multimodal cues for proximity to RW, mildly unsteady, no overt LoB          Balance Overall balance assessment: Needs assistance Sitting-balance support: Feet supported;No upper extremity supported Sitting balance-Leahy Scale: Fair     Standing balance support: Bilateral upper extremity supported;During functional activity Standing balance-Leahy Scale: Poor Standing balance comment: requires UE support to maintain balance, tremulous in static standing                            Cognition Arousal/Alertness: Awake/alert Behavior During Therapy: WFL for tasks assessed/performed Overall Cognitive Status: Impaired/Different from baseline Area of Impairment: Following commands;Problem solving;Safety/judgement                       Following Commands: Follows multi-step commands with increased time Safety/Judgement: Decreased awareness of safety   Problem Solving: Slow processing;Requires verbal cues;Requires tactile cues General Comments: pt requires repeated cuing to stay seated while lines and leads organized          General Comments General comments (skin integrity, edema, etc.): HR max of 144 bpm dropped to 110 bpm with sitting in recliner       Pertinent Vitals/Pain Pain Assessment: 0-10 Pain Score: 9  Pain Location: abdomen Pain Descriptors / Indicators: Constant;Sharp Pain Intervention(s): Limited activity within patient's tolerance;Monitored during session;Repositioned           PT Goals (current goals  can now be found in the care plan section) Acute Rehab PT Goals Patient Stated Goal: have less pain PT Goal Formulation: With patient Time For Goal Achievement: 04/10/19 Potential to Achieve Goals: Fair Progress towards PT goals: Not  progressing toward goals - comment(limited by increase in HR with pain after sx)    Frequency    Min 2X/week      PT Plan Current plan remains appropriate       AM-PAC PT "6 Clicks" Mobility   Outcome Measure  Help needed turning from your back to your side while in a flat bed without using bedrails?: None Help needed moving from lying on your back to sitting on the side of a flat bed without using bedrails?: None Help needed moving to and from a bed to a chair (including a wheelchair)?: A Little Help needed standing up from a chair using your arms (e.g., wheelchair or bedside chair)?: A Little Help needed to walk in hospital room?: A Little Help needed climbing 3-5 steps with a railing? : Total 6 Click Score: 18    End of Session Equipment Utilized During Treatment: Gait belt Activity Tolerance: Patient limited by fatigue Patient left: in bed;Other (comment)(OT in room on exit) Nurse Communication: Mobility status PT Visit Diagnosis: Unsteadiness on feet (R26.81);Other abnormalities of gait and mobility (R26.89);Repeated falls (R29.6);Muscle weakness (generalized) (M62.81);History of falling (Z91.81);Difficulty in walking, not elsewhere classified (R26.2);Pain Pain - part of body: (abdomen)     Time: [id number] PT Time Calculation (min) (ACUTE ONLY): 25 min  Charges:  $Gait Training: 23-37 mins                     Junette Bernat B. Beverely RisenVan Fleet PT, DPT Acute Rehabilitation Services Pager [phone] Office [phone]    [place] Alaslizabeth B Van Fleet 03/29/2019, 2:54 PM

## 2019-03-30 ENCOUNTER — Encounter (HOSPITAL_COMMUNITY): Payer: Self-pay | Admitting: Surgery

## 2019-03-30 LAB — BASIC METABOLIC PANEL
Anion gap: 8 (ref 5–15)
BUN: 5 mg/dL — ABNORMAL LOW (ref 6–20)
CO2: 25 mmol/L (ref 22–32)
Calcium: 8.7 mg/dL — ABNORMAL LOW (ref 8.9–10.3)
Chloride: 104 mmol/L (ref 98–111)
Creatinine, Ser: 0.73 mg/dL (ref 0.61–1.24)
GFR calc Af Amer: >60 mL/min (ref >60)
GFR calc non Af Amer: >60 mL/min (ref >60)
Glucose, Bld: 88 mg/dL (ref 70–99)
Potassium: 4 mmol/L (ref 3.5–5.1)
Sodium: 137 mmol/L (ref 135–145)

## 2019-03-30 LAB — CBC
HCT: 39.3 % (ref 39.0–52.0)
Hemoglobin: 13 g/dL (ref 13.0–17.0)
MCH: 36.9 pg — ABNORMAL HIGH (ref 26.0–34.0)
MCHC: 33.1 g/dL (ref 30.0–36.0)
MCV: 111.6 fL — ABNORMAL HIGH (ref 80.0–100.0)
Platelets: 147 10*3/uL — ABNORMAL LOW (ref 150–400)
RBC: 3.52 MIL/uL — ABNORMAL LOW (ref 4.22–5.81)
RDW: 15.9 % — ABNORMAL HIGH (ref 11.5–15.5)
WBC: 7 10*3/uL (ref 4.0–10.5)
nRBC: 0 % (ref 0.0–0.2)

## 2019-03-30 LAB — CULTURE, BLOOD (ROUTINE X 2)
Culture: NO GROWTH
Culture: NO GROWTH
Special Requests: ADEQUATE
Special Requests: ADEQUATE

## 2019-03-30 MED ORDER — OXYCODONE HCL 5 MG PO TABS: 5.0000 mg | ORAL_TABLET | ORAL | 0 refills | Status: DC | PRN

## 2019-03-30 NOTE — Progress Notes (Signed)
Pharmacy Antibiotic Note  Tom Bender is a 58 y.o. male admitted on 03/23/2019 with concern for sepsis and found to have 2/2 BCx positive for MSSE (MRSE on BCID). It is being evaluated whether this is a true pathogen or contamination - repeat blood cultures are clear (final).  Pharmacy has been consulted for Vancomycin dosing. Plan is to change to doxycycline on discharge.  Renal function is stable.  Vancomycin 750 mg IV Q 8 hrs. Goal AUC 400-550. Expected AUC: 434 SCr used: 0.8  Plan: - Continue Vancomycin 750 mg IV every 8 hours - Will hold off on levels for now - expected discontinuation soon - Will continue to follow renal function, culture results, LOT, and antibiotic de-escalation plans   Height: 5\' 9"  (175.3 cm) Weight: 162 lb (73.5 kg) IBW/kg (Calculated) : 70.7  Temp (24hrs), Avg:98.4 F (36.9 C), Min:98.2 F (36.8 C), Max:98.6 F (37 C)  Recent Labs  Lab 03/23/19 1606 03/23/19 1745 03/24/19 0021 03/24/19 0950 03/25/19 1124 03/26/19 0436 03/27/19 1014 03/30/19 0758  WBC 6.4  --  5.9  --   --  5.8 6.8 7.0  CREATININE 0.82  --  0.73  --  0.63 0.56* 0.63 0.73  LATICACIDVEN 4.7* 3.8*  --  1.7  --   --   --   --     Estimated Creatinine Clearance: 104.3 mL/min (by C-G formula based on SCr of 0.73 mg/dL).    No Known Allergies  Antimicrobials this admission: Vanc 6/5 >> Cefepime 6/5 >>6/6 Flagyl 6/5 x1  Microbiology results: 6/5 Bcx: 2/4 Staph species - both Staph hominis. 2/4 (different sets) CoNS w/ MecA positive 6/5 Ucx: NGF 6/6 COVID negative 6/7 Bcx: NGF   Thank you for involving pharmacy in this patient's care.  Renold Genta, PharmD, BCPS Clinical Pharmacist 03/30/2019 2:26 PM  **Pharmacist phone directory can be found on [email or web address] listed under [place]**

## 2019-03-30 NOTE — Progress Notes (Signed)
PROGRESS NOTE    Tom Bender  JXB:[id number] DOB: [birth year] DOA: 03/23/2019 PCP: Patient, No Pcp Per   Brief Narrative: Tom Bender is a 58 y.o. male with a history of alcohol and tobacco abuse, CKD stage II, hypertension. He presented secondary to weakness and aches and found to meet sepsis criteria with evidence of bacteremia. Abdominal pain is also present with concern for possible acute cholecystitis vs gastritis. GI on board. On IV Vancomycin. Underwent cholecystectomy for possible cholecystitis.   Assessment & Plan:   Principal Problem:   SIRS (systemic inflammatory response syndrome) (HCC) Active Problems:   Hyponatremia   Hypokalemia   Benign essential HTN   Alcohol abuse   Lactic acidosis   Pancreatic cyst   Sepsis Bacteremia source. Present on admission.  Physiology improved.  Coagulase negative MR staph bacteremia Unknown source. 2/4 bottles. Repeat blood cultures (6/7) no growth to date. -Switch to doxycycline  Upper abdominal pain Concern for possible gallbladder etiology vs gastritis. HIDA scan obtained and was negative. MRCP significant for gallbladder wall thickening. GI consulted and patient underwent EGD. General surgery consulted for cholecystectomy which occurred on 6/11. Sharp pain that radiated to back has resolved since surgery.  Hypokalemia Hypomagnesemia Replete as needed.  Essential hypertension Well controlled. Not on medications as an outpatient.  Alcohol abuse Alcohol withdrawal Withdrawal symptoms resolved.  Hyponatremia Resolved with IV fluids.  Dehydration Resolved with IV fluids.  Pancreatic tail lesion Favored to be a pseudocyst but indolent cystic neoplasm cannot be ruled out recommendations for pre and post-contrast MRI/MRCP in 1 year.   DVT prophylaxis: Heparin subcu Code Status:   Code Status: Full Code Family Communication: None Disposition Plan: Discharge to SNF when bed available and when general surgery has  recommended discharge   Consultants:   [facility]  General surgery  Procedures:   None  Antimicrobials:  Vancomycin  Cefepime  Doxycycline   Subjective: Previous abdominal pain has resolved. Now he has post-surgical pain, but it is manageable. Feels very weak on his feet.  Objective: Vitals:   03/29/19 1045 03/29/19 1100 03/29/19 2203 03/30/19 0650  BP: 100/68 101/71 112/90 123/86  Pulse: (!) 106 94 (!) 105 65  Resp: Temp:  98.1 F (36.7 C) 98.2 F (36.8 C) 98.6 F (37 C)  TempSrc:   Oral Oral  SpO2: 94% 96% 93% 96%  Weight:    73.5 kg  Height:        Intake/Output Summary (Last 24 hours) at 03/30/2019 0749 Last data filed at 03/30/2019 0229 Gross per 24 hour  Intake 1000 ml  Output 1305 ml  Net -305 ml   Filed Weights   03/29/19 0555 03/29/19 0822 03/30/19 0650  Weight: 76.2 kg 76.2 kg 73.5 kg    Examination:  General exam: Appears calm and comfortable Respiratory system: Clear to auscultation. Respiratory effort normal. Cardiovascular system: S1 & S2 heard, RRR. No murmurs, rubs, gallops or clicks. Gastrointestinal system: Abdomen is nondistended, soft and mildly tender. No organomegaly or masses felt. Normal bowel sounds heard. Central nervous system: Alert and oriented. No focal neurological deficits. Extremities: No edema. No calf tenderness Skin: No cyanosis. No rashes Psychiatry: Judgement and insight appear normal. Mood & affect appropriate.     Data Reviewed: I have personally reviewed following labs and imaging studies  CBC: Recent Labs  Lab 03/23/19 1606 03/24/19 0021 03/26/19 0436 03/27/19 1014  WBC 6.4 5.9 5.8 6.8  NEUTROABS 3.8  --   --   --  HGB 14.9 13.1 11.6* 12.0*  HCT 42.5 37.3* 33.7* 35.5*  MCV 104.7* 103.9* 107.3* 111.3*  PLT 137* 112* 94* 025*   Basic Metabolic Panel: Recent Labs  Lab 03/23/19 1606 03/24/19 0021 03/24/19 0950 03/25/19 1124 03/26/19 0436 03/27/19 1014  NA 133* 136  --   134* 135 135  K 3.4* 2.9* 2.7* 3.1* 3.7 4.1  CL 88* 97*  --  98 104 104  CO2 33* 28  --  27 25 26   GLUCOSE 126* 122*  --  117* 94 116*  BUN <5* <5*  --  <5* <5* <5*  CREATININE 0.82 0.73  --  0.63 0.56* 0.63  CALCIUM 8.5* 7.8*  --  7.7* 7.7* 8.2*  MG  --   --  1.4* 1.5* 1.8 1.8  PHOS  --   --   --  2.3* 2.8  --    GFR: Estimated Creatinine Clearance: 104.3 mL/min (by C-G formula based on SCr of 0.63 mg/dL). Liver Function Tests: Recent Labs  Lab 03/23/19 1606 03/24/19 0021 03/26/19 0436 03/27/19 1014  AST 170* 126* 78* 84*  ALT 59* 48* 33 32  ALKPHOS 166* 140* 110 113  BILITOT 3.8* 3.3* 1.1 0.9  PROT 6.8 5.9* 5.4* 5.5*  ALBUMIN 3.1* 2.5* 2.2* 2.4*   Recent Labs  Lab 03/24/19 0950  LIPASE 35   No results for input(s): AMMONIA in the last 168 hours. Coagulation Profile: No results for input(s): INR, PROTIME in the last 168 hours. Cardiac Enzymes: Recent Labs  Lab 03/23/19 1606  TROPONINI <0.03   BNP (last 3 results) No results for input(s): PROBNP in the last 8760 hours. HbA1C: No results for input(s): HGBA1C in the last 72 hours. CBG: No results for input(s): GLUCAP in the last 168 hours. Lipid Profile: No results for input(s): CHOL, HDL, LDLCALC, TRIG, CHOLHDL, LDLDIRECT in the last 72 hours. Thyroid Function Tests: No results for input(s): TSH, T4TOTAL, FREET4, T3FREE, THYROIDAB in the last 72 hours. Anemia Panel: No results for input(s): VITAMINB12, FOLATE, FERRITIN, TIBC, IRON, RETICCTPCT in the last 72 hours. Sepsis Labs: Recent Labs  Lab 03/23/19 1606 03/23/19 1745 03/24/19 0950  LATICACIDVEN 4.7* 3.8* 1.7    Recent Results (from the past 240 hour(s))  Blood Culture (routine x 2)     Status: Abnormal   Collection Time: 03/23/19  4:00 PM   Specimen: BLOOD  Result Value Ref Range Status   Specimen Description   Final    BLOOD RIGHT ANTECUBITAL Performed at [facility], [place] [address], [place], [place] [address]    Special Requests    Final    BOTTLES DRAWN AEROBIC AND ANAEROBIC Blood Culture adequate volume Performed at [facility], [address] [place], [place] [address]    Culture  Setup Time   Final    GRAM POSITIVE COCCI AEROBIC BOTTLE ONLY CRITICAL VALUE NOTED.  VALUE IS CONSISTENT WITH PREVIOUSLY REPORTED AND CALLED VALUE. GRAM POSITIVE RODS ANAEROBIC BOTTLE ONLY CRITICAL RESULT CALLED TO, READ BACK BY AND VERIFIED WITH: J. LEDFORD,PHARMD 2257 03/25/2019 T. TYSOR    Culture (A)  Final    STAPHYLOCOCCUS SPECIES (COAGULASE NEGATIVE) SUSCEPTIBILITIES PERFORMED ON PREVIOUS CULTURE WITHIN THE LAST 5 DAYS. BACILLUS SPECIES Standardized susceptibility testing for this organism is not available. Performed at [facility], [place] [address], [place], [place] [address]    Report Status 03/26/2019 FINAL  Final  Urine culture     Status: None   Collection Time: 03/23/19  4:06  PM   Specimen: Urine, Clean Catch  Result Value Ref Range Status   Specimen Description   Final    URINE, CLEAN CATCH Performed at [facility], [address] [place], [place] [address]    Special Requests   Final    NONE Performed at [facility], [address] [place], [place] [address]    Culture   Final    NO GROWTH Performed at [facility], [address] [place], [place] [address]    Report Status 03/25/2019 FINAL  Final  Blood Culture (routine x 2)     Status: Abnormal   Collection Time: 03/23/19  5:00 PM   Specimen: BLOOD RIGHT FOREARM  Result Value Ref Range Status   Specimen Description   Final    BLOOD RIGHT FOREARM Performed at [facility], [address] [place], [place] [address]    Special Requests   Final    BOTTLES DRAWN AEROBIC AND ANAEROBIC Blood Culture adequate volume Performed at [facility], [address] [place], [place] [address]    Culture  Setup Time   Final    GRAM POSITIVE COCCI AEROBIC BOTTLE ONLY CRITICAL RESULT CALLED  TO, READ BACK BY AND VERIFIED WITH: C WHITESIDE AT 1400 ON [id number] BY SJW Performed at [facility], [address], [place], [place] [address]    Culture STAPHYLOCOCCUS SPECIES (COAGULASE NEGATIVE) (A)  Final   Report Status 03/26/2019 FINAL  Final   Organism ID, Bacteria STAPHYLOCOCCUS SPECIES (COAGULASE NEGATIVE)  Final      Susceptibility   Staphylococcus species (coagulase negative) - MIC*    CIPROFLOXACIN <=0.5 SENSITIVE Sensitive     ERYTHROMYCIN <=0.25 SENSITIVE Sensitive     GENTAMICIN <=0.5 SENSITIVE Sensitive     OXACILLIN <=0.25 SENSITIVE Sensitive     TETRACYCLINE <=1 SENSITIVE Sensitive     VANCOMYCIN 1 SENSITIVE Sensitive     TRIMETH/SULFA <=10 SENSITIVE Sensitive     CLINDAMYCIN <=0.25 SENSITIVE Sensitive     RIFAMPIN <=0.5 SENSITIVE Sensitive     Inducible Clindamycin NEGATIVE Sensitive     * STAPHYLOCOCCUS SPECIES (COAGULASE NEGATIVE)  Blood Culture ID Panel (Reflexed)     Status: Abnormal   Collection Time: 03/23/19  5:00 PM  Result Value Ref Range Status   Enterococcus species NOT DETECTED NOT DETECTED Final   Listeria monocytogenes NOT DETECTED NOT DETECTED Final   Staphylococcus species DETECTED (A) NOT DETECTED Final    Comment: Methicillin (oxacillin) resistant coagulase negative staphylococcus. Possible blood culture contaminant (unless isolated from more than one blood culture draw or clinical case suggests pathogenicity). No antibiotic treatment is indicated for blood  culture contaminants. CRITICAL RESULT CALLED TO, READ BACK BY AND VERIFIED WITH: WHITESIDE,C AT 1400 ON [id number] BY SJW    Staphylococcus aureus (BCID) NOT DETECTED NOT DETECTED Final   Methicillin resistance DETECTED (A) NOT DETECTED Final    Comment: CRITICAL RESULT CALLED TO, READ BACK BY AND VERIFIED WITH: WHITESIDE, C PHARMD AT 1400 ON [id number] BY SJW    Streptococcus species NOT DETECTED NOT DETECTED Final   Streptococcus agalactiae NOT DETECTED NOT DETECTED Final   Streptococcus  pneumoniae NOT DETECTED NOT DETECTED Final   Streptococcus pyogenes NOT DETECTED NOT DETECTED Final   Acinetobacter baumannii NOT DETECTED NOT DETECTED Final   Enterobacteriaceae species NOT DETECTED NOT DETECTED Final   Enterobacter cloacae complex NOT DETECTED NOT DETECTED Final   Escherichia coli NOT DETECTED NOT  DETECTED Final   Klebsiella oxytoca NOT DETECTED NOT DETECTED Final   Klebsiella pneumoniae NOT DETECTED NOT DETECTED Final   Proteus species NOT DETECTED NOT DETECTED Final   Serratia marcescens NOT DETECTED NOT DETECTED Final   Haemophilus influenzae NOT DETECTED NOT DETECTED Final   Neisseria meningitidis NOT DETECTED NOT DETECTED Final   Pseudomonas aeruginosa NOT DETECTED NOT DETECTED Final   Candida albicans NOT DETECTED NOT DETECTED Final   Candida glabrata NOT DETECTED NOT DETECTED Final   Candida krusei NOT DETECTED NOT DETECTED Final   Candida parapsilosis NOT DETECTED NOT DETECTED Final   Candida tropicalis NOT DETECTED NOT DETECTED Final    Comment: Performed at [facility], [address] [place], [place] [address]  SARS Coronavirus 2 (Hosp order,Performed in [facility] lab via Abbott ID)     Status: None   Collection Time: 03/23/19  5:41 PM   Specimen: Dry Nasal Swab (Abbott ID Now)  Result Value Ref Range Status   SARS Coronavirus 2 (Abbott ID Now) NEGATIVE NEGATIVE Final    Comment: (NOTE) Interpretive Result Comment(s): COVID 19 Positive SARS CoV 2 target nucleic acids are DETECTED. The SARS CoV 2 RNA is generally detectable in upper and lower respiratory specimens during the acute phase of infection.  Positive results are indicative of active infection with SARS CoV 2.  Clinical correlation with patient history and other diagnostic information is necessary to determine patient infection status.  Positive results do not rule out bacterial infection or coinfection with other viruses. The expected result is Negative. COVID 19 Negative SARS CoV 2  target nucleic acids are NOT DETECTED. The SARS CoV 2 RNA is generally detectable in upper and lower respiratory specimens during the acute phase of infection.  Negative results do not preclude SARS CoV 2 infection, do not rule out coinfections with other pathogens, and should not be used as the sole basis for treatment or other patient management decisions.  Negative results must be combined with clinical  observations, patient history, and epidemiological information. The expected result is Negative. Invalid Presence or absence of SARS CoV 2 nucleic acids cannot be determined. Repeat testing was performed on the submitted specimen and repeated Invalid results were obtained.  If clinically indicated, additional testing on a new specimen with an alternate test methodology [phone]) is advised.  The SARS CoV 2 RNA is generally detectable in upper and lower respiratory specimens during the acute phase of infection. The expected result is Negative. Fact Sheet for Patients:  http://www.graves-ford.org/https://www.fda.gov/media/136524/download Fact Sheet for Healthcare Providers: EnviroConcern.sihttps://www.fda.gov/media/136523/download This test is not yet approved or cleared by the [place] FDA and has been authorized for detection and/or diagnosis of SARS CoV 2 by FDA under an Emergency Use Authorization (EUA).  This EUA will remain in effect (meaning this test can be used) for the duration of the COVID19 d eclaration under Section 564(b)(1) of the Act, 21 U.S.C. section [phone] 3(b)(1), unless the authorization is terminated or revoked sooner. Performed at [facility], [address] [place], [place] [address]   SARS Coronavirus 2     Status: None   Collection Time: 03/24/19 12:55 AM  Result Value Ref Range Status   SARS Coronavirus 2 NOT DETECTED NOT DETECTED Final    Comment: (NOTE) SARS-CoV-2 target nucleic acids are NOT DETECTED. The SARS-CoV-2 RNA is generally detectable in upper and lower  respiratory specimens during the acute phase of infection.  Negative  results do not preclude SARS-CoV-2 infection, do not  rule out co-infections with other pathogens, and should not be used as the sole basis for treatment or other patient management decisions.  Negative results must be combined with clinical observations, patient history, and epidemiological information. The expected result is Not Detected. Fact Sheet for Patients: http://www.biofiredefense.com/wp-content/uploads/2020/03/BIOFIRE-COVID -19-patients.pdf Fact Sheet for Healthcare Providers: http://www.biofiredefense.com/wp-content/uploads/2020/03/BIOFIRE-COVID -19-hcp.pdf This test is not yet approved or cleared by the [place] FDA and  has been authorized for detection and/or diagnosis of SARS-CoV-2 by FDA under an Emergency Use Authorization (EUA).  This EUA will remain in effec t (meaning this test can be used) for the duration of  the COVID-19 declaration under Section 564(b)(1) of the Act, 21 U.S.C. section 360bbb-3(b)(1), unless the authorization is terminated or revoked sooner. Performed at [facility], [address] [place], [place] [address]   Culture, blood (Routine X 2) w Reflex to ID Panel     Status: None (Preliminary result)   Collection Time: 03/25/19 11:36 AM   Specimen: BLOOD  Result Value Ref Range Status   Specimen Description BLOOD LEFT ANTECUBITAL  Final   Special Requests AEROBIC BOTTLE ONLY Blood Culture adequate volume  Final   Culture   Final    NO GROWTH 4 DAYS Performed at [facility], [address] [place], [place] [address]    Report Status PENDING  Incomplete  Culture, blood (Routine X 2) w Reflex to ID Panel     Status: None (Preliminary result)   Collection Time: 03/25/19 11:41 AM   Specimen: BLOOD RIGHT HAND  Result Value Ref Range Status   Specimen Description BLOOD RIGHT HAND  Final   Special Requests AEROBIC BOTTLE ONLY Blood Culture adequate volume   Final   Culture   Final    NO GROWTH 4 DAYS Performed at [facility], [address] [place], [place] [address]    Report Status PENDING  Incomplete  Surgical pcr screen     Status: None   Collection Time: 03/29/19  1:47 PM   Specimen: Nasal Mucosa; Nasal Swab  Result Value Ref Range Status   MRSA, PCR NEGATIVE NEGATIVE Final   Staphylococcus aureus NEGATIVE NEGATIVE Final    Comment: (NOTE) The Xpert SA Assay (FDA approved for NASAL specimens in patients 56 years of age and older), is one component of a comprehensive surveillance program. It is not intended to diagnose infection nor to guide or monitor treatment. Performed at [facility], [address] [place], [place] [address]          Radiology Studies: Dg Cholangiogram Operative  Result Date: 03/29/2019 CLINICAL DATA:  58 year old male undergoing laparoscopic cholecystectomy EXAM: INTRAOPERATIVE CHOLANGIOGRAM TECHNIQUE: Cholangiographic images from the C-arm fluoroscopic device were submitted for interpretation post-operatively. Please see the procedural report for the amount of contrast and the fluoroscopy time utilized. COMPARISON:  Nuclear medicine HIDA scan 03/27/2019 FINDINGS: A single cine clip is submitted for review. The images demonstrate cannulation of the cystic duct remanent and opacification of the biliary tree. No evidence of biliary stenosis, stricture, choledocholithiasis or dilatation. The ampulla is patent with contrast material flowing freely into the duodenum. IMPRESSION: Negative intraoperative cholangiogram. Electronically Signed   By: Malachy MoanHeath  McCullough M.D.   On: 03/29/2019 11:03        Scheduled Meds: . folic acid  1 mg Oral Daily  . heparin  5,000 Units Subcutaneous Q8H  . multivitamin with minerals  1 tablet Oral Daily  . nicotine  21 mg Transdermal Daily  .  pantoprazole  40 mg Oral Daily  . potassium & sodium phosphates  1 packet Oral TID WC & HS  . thiamine  100 mg Oral  Daily   Continuous Infusions: . lactated ringers 10 mL/hr at 03/29/19 0825  . vancomycin 750 mg (03/30/19 0226)     LOS: 6 days     Jacquelin Hawkingalph Nettey, MD [facility] 03/30/2019, 7:49 AM  If 7PM-7AM, please contact night-coverage [email or web address]

## 2019-03-30 NOTE — Progress Notes (Signed)
Occupational Therapy Treatment Patient Details Name: Tom Bender Diebold MRN: [id number] DOB: 07/23/1961 Today's Date: 03/30/2019    History of present illness 58 y.o. male with medical history significant of alcohol abuse, tobacco abuse, chronic kidney disease stage II, hypertension, presenting with progressive weakness body aches headaches chest pain and shortness of breath, weakness and falls, along with 8/10 abdominal pain. Pt admitted 6/5 for evaluation for SIRS. s/p cholecystectomy 6/11   OT comments  Pt now s/p cholecystectomy on 6/11. Pt painful during session. Continue to present with impulsivity during mobility/movement. Min assist using RW for simulated toilet transfer today. Continue to recommend SNF for discharge planning. Will continue to follow acutely.  Follow Up Recommendations  SNF    Equipment Recommendations  None recommended by OT    Recommendations for Other Services      Precautions / Restrictions Precautions Precautions: Fall Precaution Comments: watch HR, hx of falling 3 in last month       Mobility Bed Mobility Overal bed mobility: Needs Assistance Bed Mobility: Supine to Sit     Supine to sit: Supervision     General bed mobility comments: supervision for safety  Transfers Overall transfer level: Needs assistance Equipment used: Rolling walker (2 wheeled) Transfers: Sit to/from Stand Sit to Stand: Min assist         General transfer comment: Min assist for power up and steadying.    Balance                                           ADL either performed or assessed with clinical judgement   ADL Overall ADL's : Needs assistance/impaired                     Lower Body Dressing: Maximal assistance;Sitting/lateral leans Lower Body Dressing Details (indicate cue type and reason): donning socks Toilet Transfer: Minimal assistance;Ambulation;RW Toilet Transfer Details (indicate cue type and reason): simulated            General ADL Comments: Pt shaking during mobility.  Therapist educated him on rolling to his side and then sitting up rather than performing supine to sit in order to minimize abdominal discomfort/pain during transition. Also educated on using pillow (to hug) when he needs to cough/sneeze.     Vision       Perception     Praxis      Cognition Arousal/Alertness: Awake/alert Behavior During Therapy: WFL for tasks assessed/performed Overall Cognitive Status: Impaired/Different from baseline Area of Impairment: Following commands;Problem solving;Safety/judgement                       Following Commands: Follows multi-step commands with increased time Safety/Judgement: Decreased awareness of safety;Decreased awareness of deficits   Problem Solving: Slow processing;Requires verbal cues General Comments: Pt is impulsive with movement. Attempting to stand up when asked to remain seated while therapist organzied lines and furniture.        Exercises     Shoulder Instructions       General Comments HR increases to low 120s with bed mobility and ambulating ~5 ft to recliner. Quickly drops to 83 once sitting in chair.    Pertinent Vitals/ Pain       Pain Assessment: Faces Faces Pain Scale: Hurts even more Pain Location: abdomen Pain Descriptors / Indicators: Constant;Sharp Pain Intervention(s): Limited activity within patient's tolerance;Monitored during session;Repositioned  Home Living                                          Prior Functioning/Environment              Frequency  Min 2X/week        Progress Toward Goals  OT Goals(current goals can now be found in the care plan section)  Progress towards OT goals: Progressing toward goals  Acute Rehab OT Goals Patient Stated Goal: have less pain OT Goal Formulation: With patient Time For Goal Achievement: 04/10/19 ADL Goals Pt Will Perform Grooming: with min guard  assist;standing Pt Will Transfer to Toilet: with min guard assist;ambulating;regular height toilet Additional ADL Goal #1: pt will perform adl with set up and min guard when standing, using AE as needed Additional ADL Goal #2: pt will self initiate at least one rest break as needed without cues  Plan Discharge plan remains appropriate;Frequency remains appropriate    Co-evaluation                 AM-PAC OT "6 Clicks" Daily Activity     Outcome Measure   Help from another person eating meals?: None Help from another person taking care of personal grooming?: A Little Help from another person toileting, which includes using toliet, bedpan, or urinal?: A Little Help from another person bathing (including washing, rinsing, drying)?: A Lot Help from another person to put on and taking off regular upper body clothing?: A Little Help from another person to put on and taking off regular lower body clothing?: A Lot 6 Click Score: 17    End of Session Equipment Utilized During Treatment: Rolling walker  OT Visit Diagnosis: Unsteadiness on feet (R26.81);Muscle weakness (generalized) (M62.81);Pain   Activity Tolerance Patient tolerated treatment well   Patient Left in chair;with call bell/phone within reach;with chair alarm set   Nurse Communication Mobility status        Time: 7353-2992 OT Time Calculation (min): 10 min  Charges: OT General Charges $OT Visit: 1 Visit OT Treatments $Self Care/Home Management : 8-22 mins     Darrol Jump OTR/L Acute Rehabilitation Service [phone] 03/30/2019, 9:36 AM

## 2019-03-30 NOTE — Final Consult Note (Signed)
Consultant Final Sign-Off Note    Assessment/Final recommendations  Tom Bender is a 58 y.o. male followed by me for symptomatic cholelithiasis   Patient is s/p Laparoscopic Cholecystectomy with IOC by Dr. Georgette Dover on 6/11. POD #1  On POD 1, the patient was voiding well, tolerating diet, ambulating well, pain well controlled, vital signs stable, incisions c/d/i and felt stable for discharge home from a surgical standpoint. Thank you for allowing [place] to participate in the care of your patient!  Please consult [place] again if you have further needs for your patient.  Wound care (if applicable): Patient can remove tegaderm in 48 hours. He can shower. Pat dry. Do not submerge in water for the next 2 weeks.    Diet at discharge: Low Fat. Avoid high fat foods (fried foods, greasy foods etc) and spicy foods. Small, frequent meals area preferred over large meals. Handout attached.    Activity at discharge: Patient should not lift anything greater than 15lbs for the next 2 weeks. He should not lift anything greater than 40lbs for the next 4 weeks   Follow-up appointment: DOW - phone visit    Pending results:  Unresulted Labs (From admission, onward)   None       Medication recommendations: Pain medication sent to the pharmacy. No additional abx requried from a general surgery standpoint.    Other recommendations:     Thank you for allowing [place] to participate in the care of your patient!  Please consult [place] again if you have further needs for your patient.  Tom Bender 03/30/2019 10:12 AM    Subjective   Doing well this morning. Soreness around incisions. Tolerating diet (had french toast this morning) without abdominal pain, n/v. Has mobilizes. Voiding without difficulty.   Objective  Vital signs in last 24 hours: Temp:  [97.9 F (36.6 C)-98.6 F (37 C)] 98.6 F (37 C) (06/12 0650) Pulse Rate:  [65-120] 65 (06/12 0650) Resp:  [15-16] 16 (06/12 0650) BP: (100-123)/(68-90) 123/86  (06/12 0650) SpO2:  [93 %-98 %] 96 % (06/12 0650) Weight:  [73.5 kg] 73.5 kg (06/12 0650)  Gen: Awake and alert, NAD Lungs: Normal rate and effort Abd: Soft, ND, appropriately tender around laparoscopic incisions sites. No r/r/g.  Laparoscopic incisions sites with tegaderm overlying. Some dried blood notes on gauze but appears otherwise c/d/i. +BS Skin: Warm and dry.    Pertinent labs and Studies: Recent Labs    03/27/19 1014 03/30/19 0758  WBC 6.8 7.0  HGB 12.0* 13.0  HCT 35.5* 39.3   BMET Recent Labs    03/27/19 1014 03/30/19 0758  NA 135 137  K 4.1 4.0  CL 104 104  CO2 26 25  GLUCOSE 116* 88  BUN <5* 5*  CREATININE 0.63 0.73  CALCIUM 8.2* 8.7*   No results for input(s): LABURIN in the last 72 hours. Results for orders placed or performed during the Bender encounter of 03/23/19  Blood Culture (routine x 2)     Status: Abnormal   Collection Time: 03/23/19  4:00 PM   Specimen: BLOOD  Result Value Ref Range Status   Specimen Description   Final    BLOOD RIGHT ANTECUBITAL Performed at [facility], [place] [address], [place], [place] [address]    Special Requests   Final    BOTTLES DRAWN AEROBIC AND ANAEROBIC Blood Culture adequate volume Performed at [facility], [place] [place], [place] [address]    Culture  Setup Time  Final    GRAM POSITIVE COCCI AEROBIC BOTTLE ONLY CRITICAL VALUE NOTED.  VALUE IS CONSISTENT WITH PREVIOUSLY REPORTED AND CALLED VALUE. GRAM POSITIVE RODS ANAEROBIC BOTTLE ONLY CRITICAL RESULT CALLED TO, READ BACK BY AND VERIFIED WITH: J. LEDFORD,PHARMD 2257 03/25/2019 T. TYSOR    Culture (A)  Final    STAPHYLOCOCCUS SPECIES (COAGULASE NEGATIVE) SUSCEPTIBILITIES PERFORMED ON PREVIOUS CULTURE WITHIN THE LAST 5 DAYS. BACILLUS SPECIES Standardized susceptibility testing for this organism is not available. Performed at [facility], [address] [place], [place] [address]    Report Status 03/26/2019 FINAL   Final  Urine culture     Status: None   Collection Time: 03/23/19  4:06 PM   Specimen: Urine, Clean Catch  Result Value Ref Range Status   Specimen Description   Final    URINE, CLEAN CATCH Performed at [facility] [place], [address] [place], [place] [address]    Special Requests   Final    NONE Performed at [facility] [place], [address] [place], [place] [address]    Culture   Final    NO GROWTH Performed at [facility], [address] [place], [place] [address]    Report Status 03/25/2019 FINAL  Final  Blood Culture (routine x 2)     Status: Abnormal   Collection Time: 03/23/19  5:00 PM   Specimen: BLOOD RIGHT FOREARM  Result Value Ref Range Status   Specimen Description   Final    BLOOD RIGHT FOREARM Performed at [facility] [place], [address] [place], [place] [address]    Special Requests   Final    BOTTLES DRAWN AEROBIC AND ANAEROBIC Blood Culture adequate volume Performed at [facility] [place], [address] [place], [place] [address]    Culture  Setup Time   Final    GRAM POSITIVE COCCI AEROBIC BOTTLE ONLY CRITICAL RESULT CALLED TO, READ BACK BY AND VERIFIED WITH: C WHITESIDE AT 1400 ON [id number] BY SJW Performed at [facility], [address] [place], [place] [address]    Culture STAPHYLOCOCCUS SPECIES (COAGULASE NEGATIVE) (A)  Final   Report Status 03/26/2019 FINAL  Final   Organism ID, Bacteria STAPHYLOCOCCUS SPECIES (COAGULASE NEGATIVE)  Final      Susceptibility   Staphylococcus species (coagulase negative) - MIC*    CIPROFLOXACIN <=0.5 SENSITIVE Sensitive     ERYTHROMYCIN <=0.25 SENSITIVE Sensitive     GENTAMICIN <=0.5 SENSITIVE Sensitive     OXACILLIN <=0.25 SENSITIVE Sensitive     TETRACYCLINE <=1 SENSITIVE Sensitive     VANCOMYCIN 1 SENSITIVE Sensitive     TRIMETH/SULFA <=10 SENSITIVE Sensitive     CLINDAMYCIN <=0.25 SENSITIVE Sensitive     RIFAMPIN <=0.5 SENSITIVE Sensitive     Inducible Clindamycin  NEGATIVE Sensitive     * STAPHYLOCOCCUS SPECIES (COAGULASE NEGATIVE)  Blood Culture ID Panel (Reflexed)     Status: Abnormal   Collection Time: 03/23/19  5:00 PM  Result Value Ref Range Status   Enterococcus species NOT DETECTED NOT DETECTED Final   Listeria monocytogenes NOT DETECTED NOT DETECTED Final   Staphylococcus species DETECTED (A) NOT DETECTED Final    Comment: Methicillin (oxacillin) resistant coagulase negative staphylococcus. Possible blood culture contaminant (unless isolated from more than one blood culture draw or clinical case suggests pathogenicity). No antibiotic treatment is indicated for blood  culture contaminants. CRITICAL RESULT CALLED TO, READ BACK BY AND VERIFIED WITH: WHITESIDE,C AT 1400 ON  [id number] BY SJW    Staphylococcus aureus (BCID) NOT DETECTED NOT DETECTED Final   Methicillin resistance DETECTED (A) NOT DETECTED Final    Comment: CRITICAL RESULT CALLED TO, READ BACK BY AND VERIFIED WITH: WHITESIDE, C PHARMD AT 1400 ON [id number] BY SJW    Streptococcus species NOT DETECTED NOT DETECTED Final   Streptococcus agalactiae NOT DETECTED NOT DETECTED Final   Streptococcus pneumoniae NOT DETECTED NOT DETECTED Final   Streptococcus pyogenes NOT DETECTED NOT DETECTED Final   Acinetobacter baumannii NOT DETECTED NOT DETECTED Final   Enterobacteriaceae species NOT DETECTED NOT DETECTED Final   Enterobacter cloacae complex NOT DETECTED NOT DETECTED Final   Escherichia coli NOT DETECTED NOT DETECTED Final   Klebsiella oxytoca NOT DETECTED NOT DETECTED Final   Klebsiella pneumoniae NOT DETECTED NOT DETECTED Final   Proteus species NOT DETECTED NOT DETECTED Final   Serratia marcescens NOT DETECTED NOT DETECTED Final   Haemophilus influenzae NOT DETECTED NOT DETECTED Final   Neisseria meningitidis NOT DETECTED NOT DETECTED Final   Pseudomonas aeruginosa NOT DETECTED NOT DETECTED Final   Candida albicans NOT DETECTED NOT DETECTED Final   Candida glabrata NOT DETECTED NOT  DETECTED Final   Candida krusei NOT DETECTED NOT DETECTED Final   Candida parapsilosis NOT DETECTED NOT DETECTED Final   Candida tropicalis NOT DETECTED NOT DETECTED Final    Comment: Performed at [facility], [address] [place], [place] [address]  SARS Coronavirus 2 (Hosp order,Performed in [facility] lab via Abbott ID)     Status: None   Collection Time: 03/23/19  5:41 PM   Specimen: Dry Nasal Swab (Abbott ID Now)  Result Value Ref Range Status   SARS Coronavirus 2 (Abbott ID Now) NEGATIVE NEGATIVE Final    Comment: (NOTE) Interpretive Result Comment(s): COVID 19 Positive SARS CoV 2 target nucleic acids are DETECTED. The SARS CoV 2 RNA is generally detectable in upper and lower respiratory specimens during the acute phase of infection.  Positive results are indicative of active infection with SARS CoV 2.  Clinical correlation with patient history and other diagnostic information is necessary to determine patient infection status.  Positive results do not rule out bacterial infection or coinfection with other viruses. The expected result is Negative. COVID 19 Negative SARS CoV 2 target nucleic acids are NOT DETECTED. The SARS CoV 2 RNA is generally detectable in upper and lower respiratory specimens during the acute phase of infection.  Negative results do not preclude SARS CoV 2 infection, do not rule out coinfections with other pathogens, and should not be used as the sole basis for treatment or other patient management decisions.  Negative results must be combined with clinical  observations, patient history, and epidemiological information. The expected result is Negative. Invalid Presence or absence of SARS CoV 2 nucleic acids cannot be determined. Repeat testing was performed on the submitted specimen and repeated Invalid results were obtained.  If clinically indicated, additional testing on a new specimen with an alternate test methodology [phone]([id number]) is  advised.  The SARS CoV 2 RNA is generally detectable in upper and lower respiratory specimens during the acute phase of infection. The expected result is Negative. Fact Sheet for Patients:  http://www.graves-ford.org/https://www.fda.gov/media/136524/download Fact Sheet for Healthcare Providers: EnviroConcern.sihttps://www.fda.gov/media/136523/download This test is not yet approved or cleared by the [place] FDA and has been authorized for detection and/or diagnosis of SARS CoV 2 by FDA under an Emergency Use Authorization (EUA).  This EUA will remain in effect (meaning this test can  be used) for the duration of the COVID19 d eclaration under Section 564(b)(1) of the Act, 21 U.S.C. section [phone] 3(b)(1), unless the authorization is terminated or revoked sooner. Performed at [facility] [place] [facility], [address] [place], [place] [address]   SARS Coronavirus 2     Status: None   Collection Time: 03/24/19 12:55 AM  Result Value Ref Range Status   SARS Coronavirus 2 NOT DETECTED NOT DETECTED Final    Comment: (NOTE) SARS-CoV-2 target nucleic acids are NOT DETECTED. The SARS-CoV-2 RNA is generally detectable in upper and lower respiratory specimens during the acute phase of infection.  Negative  results do not preclude SARS-CoV-2 infection, do not rule out co-infections with other pathogens, and should not be used as the sole basis for treatment or other patient management decisions.  Negative results must be combined with clinical observations, patient history, and epidemiological information. The expected result is Not Detected. Fact Sheet for Patients: http://www.biofiredefense.com/wp-content/uploads/2020/03/BIOFIRE-COVID -19-patients.pdf Fact Sheet for Healthcare Providers: http://www.biofiredefense.com/wp-content/uploads/2020/03/BIOFIRE-COVID -19-hcp.pdf This test is not yet approved or cleared by the [place] FDA and  has been authorized for detection and/or diagnosis of SARS-CoV-2 by FDA under an  Emergency Use Authorization (EUA).  This EUA will remain in effec t (meaning this test can be used) for the duration of  the COVID-19 declaration under Section 564(b)(1) of the Act, 21 U.S.C. section 360bbb-3(b)(1), unless the authorization is terminated or revoked sooner. Performed at [facility], [address] [place], [place] [address]   Culture, blood (Routine X 2) w Reflex to ID Panel     Status: None   Collection Time: 03/25/19 11:36 AM   Specimen: BLOOD  Result Value Ref Range Status   Specimen Description BLOOD LEFT ANTECUBITAL  Final   Special Requests AEROBIC BOTTLE ONLY Blood Culture adequate volume  Final   Culture   Final    NO GROWTH 5 DAYS Performed at [facility], [address] [place], [place] [address]    Report Status 03/30/2019 FINAL  Final  Culture, blood (Routine X 2) w Reflex to ID Panel     Status: None   Collection Time: 03/25/19 11:41 AM   Specimen: BLOOD RIGHT HAND  Result Value Ref Range Status   Specimen Description BLOOD RIGHT HAND  Final   Special Requests AEROBIC BOTTLE ONLY Blood Culture adequate volume  Final   Culture   Final    NO GROWTH 5 DAYS Performed at [facility], [address] [place], [place] [address]    Report Status 03/30/2019 FINAL  Final  Surgical pcr screen     Status: None   Collection Time: 03/29/19  1:47 PM   Specimen: Nasal Mucosa; Nasal Swab  Result Value Ref Range Status   MRSA, PCR NEGATIVE NEGATIVE Final   Staphylococcus aureus NEGATIVE NEGATIVE Final    Comment: (NOTE) The Xpert SA Assay (FDA approved for NASAL specimens in patients 56 years of age and older), is one component of a comprehensive surveillance program. It is not intended to diagnose infection nor to guide or monitor treatment. Performed at [facility], [address] [place], [place] [address]     Imaging: No results found.

## 2019-03-31 ENCOUNTER — Inpatient Hospital Stay (HOSPITAL_COMMUNITY): Payer: Self-pay

## 2019-03-31 DIAGNOSIS — R0789 Other chest pain: Secondary | ICD-10-CM

## 2019-03-31 LAB — D-DIMER, QUANTITATIVE: D-Dimer, Quant: 2.11 ug/mL-FEU — ABNORMAL HIGH (ref 0.00–0.50)

## 2019-03-31 LAB — TROPONIN I: Troponin I: <0.03 ng/mL (ref <0.03)

## 2019-03-31 MED ORDER — SODIUM CHLORIDE 0.9 % IV BOLUS
500.0000 mL | Freq: Once | INTRAVENOUS | Status: AC
  Administered 2019-03-31: 500 mL via INTRAVENOUS

## 2019-03-31 MED ORDER — POLYVINYL ALCOHOL 1.4 % OP SOLN
1.0000 [drp] | OPHTHALMIC | Status: DC | PRN
  Administered 2019-04-01 – 2019-04-02 (×3): 1 [drp] via OPHTHALMIC
  Filled 2019-03-31 (×2): qty 15

## 2019-03-31 NOTE — Progress Notes (Addendum)
PROGRESS NOTE    Tom BenneBanks Belcourt  UJW:[id number]:[id number] DOB: 26-Oct-1960 DOA: 03/23/2019 PCP: Patient, No Pcp Per   Brief Narrative: Tom Bender is a 58 y.o. male with a history of alcohol and tobacco abuse, CKD stage II, hypertension. He presented secondary to weakness and aches and found to meet sepsis criteria with evidence of bacteremia. Abdominal pain is also present with concern for possible acute cholecystitis vs gastritis. GI on board. On IV Vancomycin. Underwent cholecystectomy for possible cholecystitis.   Assessment & Plan:   Principal Problem:   SIRS (systemic inflammatory response syndrome) (HCC) Active Problems:   Hyponatremia   Hypokalemia   Benign essential HTN   Alcohol abuse   Lactic acidosis   Pancreatic cyst   Sepsis Bacteremia source. Present on admission.  Physiology improved.  Coagulase negative MR staph bacteremia Unknown source. 2/4 bottles. Repeat blood cultures (6/7) no growth to date. Completed antibiotic course.  Upper abdominal pain Concern for possible gallbladder etiology vs gastritis. HIDA scan obtained and was negative. MRCP significant for gallbladder wall thickening. GI consulted and patient underwent EGD. General surgery consulted for cholecystectomy which occurred on 6/11. Sharp pain that radiated to back has resolved since surgery.  Chest tightness -Chest x-ray, troponin, EKG  Weakness Generalized. Lightheadedness with standing -Orthostatic vitals -PT recommending SNF  Hypokalemia Hypomagnesemia Replete as needed.  Essential hypertension Well controlled. Not on medications as an outpatient.  Alcohol abuse Alcohol withdrawal Withdrawal symptoms resolved.  Hyponatremia Resolved with IV fluids.  Dehydration Resolved with IV fluids.  Pancreatic tail lesion Favored to be a pseudocyst but indolent cystic neoplasm cannot be ruled out recommendations for pre and post-contrast MRI/MRCP in 1 year.   DVT prophylaxis: Heparin subcu Code  Status:   Code Status: Full Code Family Communication: None Disposition Plan: Discharge to SNF when bed available   Consultants:   [facility]  General surgery  Procedures:   None  Antimicrobials:  Vancomycin  Cefepime  Doxycycline   Subjective: Has some lightheadedness with standing. Abdominal pain is still present. Also has some chest tightness. No dyspnea.  Objective: Vitals:   03/31/19 0816 03/31/19 1123 03/31/19 1124 03/31/19 1126  BP:  (!) 87/63 99/66 92/69   Pulse:  76 93 (!) 111  Resp: 13     Temp:      TempSrc:      SpO2:      Weight:      Height:        Intake/Output Summary (Last 24 hours) at 03/31/2019 1237 Last data filed at 03/31/2019 1200 Gross per 24 hour  Intake 660 ml  Output 1100 ml  Net -440 ml   Filed Weights   03/29/19 0822 03/30/19 0650 03/31/19 0345  Weight: 76.2 kg 73.5 kg 78.6 kg    Examination:  General exam: Appears calm and comfortable Respiratory system: Clear to auscultation. Respiratory effort normal. Cardiovascular system: S1 & S2 heard, RRR. No murmurs, rubs, gallops or clicks. Gastrointestinal system: Abdomen is nondistended, soft and mildly tender. No organomegaly or masses felt. Normal bowel sounds heard. Central nervous system: Alert and oriented. No focal neurological deficits. Extremities: No edema. No calf tenderness Skin: No cyanosis. No rashes Psychiatry: Judgement and insight appear normal. Mood & affect appropriate.     Data Reviewed: I have personally reviewed following labs and imaging studies  CBC: Recent Labs  Lab 03/26/19 0436 03/27/19 1014 03/30/19 0758  WBC 5.8 6.8 7.0  HGB 11.6* 12.0* 13.0  HCT 33.7* 35.5* 39.3  MCV 107.3* 111.3* 111.6*  PLT  94* 102* 147*   Basic Metabolic Panel: Recent Labs  Lab 03/25/19 1124 03/26/19 0436 03/27/19 1014 03/30/19 0758  NA 134* 135 135 137  K 3.1* 3.7 4.1 4.0  CL 98 104 104 104  CO2 GLUCOSE 117* 94 116* 88  BUN <5* <5*  <5* 5*  CREATININE 0.63 0.56* 0.63 0.73  CALCIUM 7.7* 7.7* 8.2* 8.7*  MG 1.5* 1.8 1.8  --   PHOS 2.3* 2.8  --   --    GFR: Estimated Creatinine Clearance: 104.3 mL/min (by C-G formula based on SCr of 0.73 mg/dL). Liver Function Tests: Recent Labs  Lab 03/26/19 0436 03/27/19 1014  AST 78* 84*  ALT 33 32  ALKPHOS 110 113  BILITOT 1.1 0.9  PROT 5.4* 5.5*  ALBUMIN 2.2* 2.4*   No results for input(s): LIPASE, AMYLASE in the last 168 hours. No results for input(s): AMMONIA in the last 168 hours. Coagulation Profile: No results for input(s): INR, PROTIME in the last 168 hours. Cardiac Enzymes: No results for input(s): CKTOTAL, CKMB, CKMBINDEX, TROPONINI in the last 168 hours. BNP (last 3 results) No results for input(s): PROBNP in the last 8760 hours. HbA1C: No results for input(s): HGBA1C in the last 72 hours. CBG: No results for input(s): GLUCAP in the last 168 hours. Lipid Profile: No results for input(s): CHOL, HDL, LDLCALC, TRIG, CHOLHDL, LDLDIRECT in the last 72 hours. Thyroid Function Tests: No results for input(s): TSH, T4TOTAL, FREET4, T3FREE, THYROIDAB in the last 72 hours. Anemia Panel: No results for input(s): VITAMINB12, FOLATE, FERRITIN, TIBC, IRON, RETICCTPCT in the last 72 hours. Sepsis Labs: No results for input(s): PROCALCITON, LATICACIDVEN in the last 168 hours.  Recent Results (from the past 240 hour(s))  Blood Culture (routine x 2)     Status: Abnormal   Collection Time: 03/23/19  4:00 PM   Specimen: BLOOD  Result Value Ref Range Status   Specimen Description   Final    BLOOD RIGHT ANTECUBITAL Performed at [facility], [address], [place], [place] [address]    Special Requests   Final    BOTTLES DRAWN AEROBIC AND ANAEROBIC Blood Culture adequate volume Performed at [facility], [address] [place], [place] [address]    Culture  Setup Time   Final    GRAM POSITIVE COCCI AEROBIC BOTTLE ONLY CRITICAL VALUE NOTED.  VALUE IS  CONSISTENT WITH PREVIOUSLY REPORTED AND CALLED VALUE. GRAM POSITIVE RODS ANAEROBIC BOTTLE ONLY CRITICAL RESULT CALLED TO, READ BACK BY AND VERIFIED WITH: J. LEDFORD,PHARMD 2257 03/25/2019 T. TYSOR    Culture (A)  Final    STAPHYLOCOCCUS SPECIES (COAGULASE NEGATIVE) SUSCEPTIBILITIES PERFORMED ON PREVIOUS CULTURE WITHIN THE LAST 5 DAYS. BACILLUS SPECIES Standardized susceptibility testing for this organism is not available. Performed at [facility], [address], [place], [place] [address]    Report Status 03/26/2019 FINAL  Final  Urine culture     Status: None   Collection Time: 03/23/19  4:06 PM   Specimen: Urine, Clean Catch  Result Value Ref Range Status   Specimen Description   Final    URINE, CLEAN CATCH Performed at [facility], [address] [place], [place] [address]    Special Requests   Final    NONE Performed at [facility], [address] [place], [place] [address]    Culture   Final    NO GROWTH Performed at [facility],  [address] [place], [place] [address]    Report Status 03/25/2019 FINAL  Final  Blood Culture (routine x 2)     Status: Abnormal   Collection Time: 03/23/19  5:00 PM   Specimen: BLOOD RIGHT FOREARM  Result Value Ref Range Status   Specimen Description   Final    BLOOD RIGHT FOREARM Performed at [facility], [address] [place], [place] [address]    Special Requests   Final    BOTTLES DRAWN AEROBIC AND ANAEROBIC Blood Culture adequate volume Performed at [facility], [address] [place], [place] [address]    Culture  Setup Time   Final    GRAM POSITIVE COCCI AEROBIC BOTTLE ONLY CRITICAL RESULT CALLED TO, READ BACK BY AND VERIFIED WITH: C WHITESIDE AT 1400 ON [id number] BY SJW Performed at [facility], [place] [address], [place], [place] [address]    Culture STAPHYLOCOCCUS SPECIES (COAGULASE NEGATIVE) (A)  Final   Report Status 03/26/2019 FINAL  Final   Organism  ID, Bacteria STAPHYLOCOCCUS SPECIES (COAGULASE NEGATIVE)  Final      Susceptibility   Staphylococcus species (coagulase negative) - MIC*    CIPROFLOXACIN <=0.5 SENSITIVE Sensitive     ERYTHROMYCIN <=0.25 SENSITIVE Sensitive     GENTAMICIN <=0.5 SENSITIVE Sensitive     OXACILLIN <=0.25 SENSITIVE Sensitive     TETRACYCLINE <=1 SENSITIVE Sensitive     VANCOMYCIN 1 SENSITIVE Sensitive     TRIMETH/SULFA <=10 SENSITIVE Sensitive     CLINDAMYCIN <=0.25 SENSITIVE Sensitive     RIFAMPIN <=0.5 SENSITIVE Sensitive     Inducible Clindamycin NEGATIVE Sensitive     * STAPHYLOCOCCUS SPECIES (COAGULASE NEGATIVE)  Blood Culture ID Panel (Reflexed)     Status: Abnormal   Collection Time: 03/23/19  5:00 PM  Result Value Ref Range Status   Enterococcus species NOT DETECTED NOT DETECTED Final   Listeria monocytogenes NOT DETECTED NOT DETECTED Final   Staphylococcus species DETECTED (A) NOT DETECTED Final    Comment: Methicillin (oxacillin) resistant coagulase negative staphylococcus. Possible blood culture contaminant (unless isolated from more than one blood culture draw or clinical case suggests pathogenicity). No antibiotic treatment is indicated for blood  culture contaminants. CRITICAL RESULT CALLED TO, READ BACK BY AND VERIFIED WITH: WHITESIDE,C AT 1400 ON [id number] BY SJW    Staphylococcus aureus (BCID) NOT DETECTED NOT DETECTED Final   Methicillin resistance DETECTED (A) NOT DETECTED Final    Comment: CRITICAL RESULT CALLED TO, READ BACK BY AND VERIFIED WITH: WHITESIDE, C PHARMD AT 1400 ON [id number] BY SJW    Streptococcus species NOT DETECTED NOT DETECTED Final   Streptococcus agalactiae NOT DETECTED NOT DETECTED Final   Streptococcus pneumoniae NOT DETECTED NOT DETECTED Final   Streptococcus pyogenes NOT DETECTED NOT DETECTED Final   Acinetobacter baumannii NOT DETECTED NOT DETECTED Final   Enterobacteriaceae species NOT DETECTED NOT DETECTED Final   Enterobacter cloacae complex NOT DETECTED NOT  DETECTED Final   Escherichia coli NOT DETECTED NOT DETECTED Final   Klebsiella oxytoca NOT DETECTED NOT DETECTED Final   Klebsiella pneumoniae NOT DETECTED NOT DETECTED Final   Proteus species NOT DETECTED NOT DETECTED Final   Serratia marcescens NOT DETECTED NOT DETECTED Final   Haemophilus influenzae NOT DETECTED NOT DETECTED Final   Neisseria meningitidis NOT DETECTED NOT DETECTED Final   Pseudomonas aeruginosa NOT DETECTED NOT DETECTED Final   Candida albicans NOT DETECTED NOT DETECTED Final   Candida glabrata NOT DETECTED NOT DETECTED Final  Candida krusei NOT DETECTED NOT DETECTED Final   Candida parapsilosis NOT DETECTED NOT DETECTED Final   Candida tropicalis NOT DETECTED NOT DETECTED Final    Comment: Performed at [facility], [address] [place], [place] [address]  SARS Coronavirus 2 (Hosp order,Performed in [facility] via [employer] ID)     Status: None   Collection Time: 03/23/19  5:41 PM   Specimen: Dry Nasal Swab (Abbott ID Now)  Result Value Ref Range Status   SARS Coronavirus 2 (Abbott ID Now) NEGATIVE NEGATIVE Final    Comment: (NOTE) Interpretive Result Comment(s): COVID 19 Positive SARS CoV 2 target nucleic acids are DETECTED. The SARS CoV 2 RNA is generally detectable in upper and lower respiratory specimens during the acute phase of infection.  Positive results are indicative of active infection with SARS CoV 2.  Clinical correlation with patient history and other diagnostic information is necessary to determine patient infection status.  Positive results do not rule out bacterial infection or coinfection with other viruses. The expected result is Negative. COVID 19 Negative SARS CoV 2 target nucleic acids are NOT DETECTED. The SARS CoV 2 RNA is generally detectable in upper and lower respiratory specimens during the acute phase of infection.  Negative results do not preclude SARS CoV 2 infection, do not rule out coinfections with other pathogens,  and should not be used as the sole basis for treatment or other patient management decisions.  Negative results must be combined with clinical  observations, patient history, and epidemiological information. The expected result is Negative. Invalid Presence or absence of SARS CoV 2 nucleic acids cannot be determined. Repeat testing was performed on the submitted specimen and repeated Invalid results were obtained.  If clinically indicated, additional testing on a new specimen with an alternate test methodology [phone] is advised.  The SARS CoV 2 RNA is generally detectable in upper and lower respiratory specimens during the acute phase of infection. The expected result is Negative. Fact Sheet for Patients:  http://www.graves-ford.org/https://www.fda.gov/media/136524/download Fact Sheet for Healthcare Providers: EnviroConcern.sihttps://www.fda.gov/media/136523/download This test is not yet approved or cleared by the [place] FDA and has been authorized for detection and/or diagnosis of SARS CoV 2 by FDA under an Emergency Use Authorization (EUA).  This EUA will remain in effect (meaning this test can be used) for the duration of the COVID19 d eclaration under Section 564(b)(1) of the Act, 21 U.S.C. section 562 642 [id number] 3(b)(1), unless the authorization is terminated or revoked sooner. Performed at [facility] [place] [facility], [address] [place], [place] [address]   SARS Coronavirus 2     Status: None   Collection Time: 03/24/19 12:55 AM  Result Value Ref Range Status   SARS Coronavirus 2 NOT DETECTED NOT DETECTED Final    Comment: (NOTE) SARS-CoV-2 target nucleic acids are NOT DETECTED. The SARS-CoV-2 RNA is generally detectable in upper and lower respiratory specimens during the acute phase of infection.  Negative  results do not preclude SARS-CoV-2 infection, do not rule out co-infections with other pathogens, and should not be used as the sole basis for treatment or other patient management decisions.  Negative  results must be combined with clinical observations, patient history, and epidemiological information. The expected result is Not Detected. Fact Sheet for Patients: http://www.biofiredefense.com/wp-content/uploads/2020/03/BIOFIRE-COVID -19-patients.pdf Fact Sheet for Healthcare Providers: http://www.biofiredefense.com/wp-content/uploads/2020/03/BIOFIRE-COVID -19-hcp.pdf This test is not yet approved or cleared by the [place] FDA and  has been authorized for detection and/or diagnosis of SARS-CoV-2 by FDA under an Emergency Use  Authorization (EUA).  This EUA will remain in effec t (meaning this test can be used) for the duration of  the COVID-19 declaration under Section 564(b)(1) of the Act, 21 U.S.C. section 360bbb-3(b)(1), unless the authorization is terminated or revoked sooner. Performed at [facility], [address] [place], [place] [address]   Culture, blood (Routine X 2) w Reflex to ID Panel     Status: None   Collection Time: 03/25/19 11:36 AM   Specimen: BLOOD  Result Value Ref Range Status   [facility]ription BLOOD LEFT ANTECUBITAL  Final   Special Requests AEROBIC BOTTLE ONLY Blood Culture adequate volume  Final   Culture   Final    NO GROWTH 5 DAYS Performed at [facility], [address] [place], [place] [address]    Report Status 03/30/2019 FINAL  Final  Culture, blood (Routine X 2) w Reflex to ID Panel     Status: None   Collection Time: 03/25/19 11:41 AM   Specimen: BLOOD RIGHT HAND  Result Value Ref Range Status   Specimen Description BLOOD RIGHT HAND  Final   Special Requests AEROBIC BOTTLE ONLY Blood Culture adequate volume  Final   Culture   Final    NO GROWTH 5 DAYS Performed at [facility], [address] [place], [place] [address]    Report Status 03/30/2019 FINAL  Final  Surgical pcr screen     Status: None   Collection Time: 03/29/19  1:47 PM   Specimen: Nasal Mucosa; Nasal Swab  Result Value Ref Range Status    MRSA, PCR NEGATIVE NEGATIVE Final   Staphylococcus aureus NEGATIVE NEGATIVE Final    Comment: (NOTE) The Xpert SA Assay (FDA approved for NASAL specimens in patients 56 years of age and older), is one component of a comprehensive surveillance program. It is not intended to diagnose infection nor to guide or monitor treatment. Performed at [facility], [address] [place], [place] [address]          Radiology Studies: No results found.      Scheduled Meds: . folic acid  1 mg Oral Daily  . heparin  5,000 Units Subcutaneous Q8H  . multivitamin with minerals  1 tablet Oral Daily  . nicotine  21 mg Transdermal Daily  . pantoprazole  40 mg Oral Daily  . potassium & sodium phosphates  1 packet Oral TID WC & HS  . thiamine  100 mg Oral Daily   Continuous Infusions: . lactated ringers 10 mL/hr at 03/29/19 0825  . sodium chloride    . vancomycin 750 mg (03/31/19 1002)     LOS: 7 days     Jacquelin Hawkingalph Jahden Schara, MD [facility] 03/31/2019, 12:37 PM  If 7PM-7AM, please contact night-coverage [email or web address]

## 2019-04-01 DIAGNOSIS — H109 Unspecified conjunctivitis: Secondary | ICD-10-CM

## 2019-04-01 MED ORDER — ERYTHROMYCIN 5 MG/GM OP OINT
TOPICAL_OINTMENT | Freq: Four times a day (QID) | OPHTHALMIC | Status: DC
  Administered 2019-04-01 – 2019-04-02 (×4): via OPHTHALMIC
  Filled 2019-04-01: qty 3.5

## 2019-04-01 MED ORDER — HYDROCORTISONE 1 % EX OINT
TOPICAL_OINTMENT | CUTANEOUS | Status: DC | PRN
  Administered 2019-04-02: 06:00:00 via TOPICAL
  Filled 2019-04-01 (×2): qty 28

## 2019-04-01 NOTE — Progress Notes (Signed)
PROGRESS NOTE    Tom BenneBanks Back  RUE:[id number] DOB: Sep 18, 1961 DOA: 03/23/2019 PCP: Patient, No Pcp Per   Brief Narrative: Tom Bender is a 58 y.o. male with a history of alcohol and tobacco abuse, CKD stage II, hypertension. He presented secondary to weakness and aches and found to meet sepsis criteria with evidence of bacteremia. Abdominal pain is also present with concern for possible acute cholecystitis vs gastritis. GI on board. On IV Vancomycin. Underwent cholecystectomy for possible cholecystitis.   Assessment & Plan:   Principal Problem:   SIRS (systemic inflammatory response syndrome) (HCC) Active Problems:   Hyponatremia   Hypokalemia   Benign essential HTN   Alcohol abuse   Lactic acidosis   Pancreatic cyst   Sepsis Bacteremia source. Present on admission.  Physiology improved.  Coagulase negative MR staph bacteremia Unknown source. 2/4 bottles. Repeat blood cultures (6/7) no growth to date. Completed antibiotic course.  Upper abdominal pain Concern for possible gallbladder etiology vs gastritis. HIDA scan obtained and was negative. MRCP significant for gallbladder wall thickening. GI consulted and patient underwent EGD. General surgery consulted for cholecystectomy which occurred on 6/11. Sharp pain that radiated to back has resolved since surgery.  Chest tightness Unsure of etiology. EKG unremarkable, troponin negative, D-dimer appears stable and previous CTA chest negative for a PE. Possibly related to some reflux -Continue PPI -Continue Maalox  Conjunctivitis Likely viral but unsure. Significant symptoms of burning. -Start erythromcycin  Weakness Generalized. Lightheadedness with standing -Orthostatic vitals -PT recommending SNF  Hypokalemia Hypomagnesemia Replete as needed.  Essential hypertension Well controlled. Not on medications as an outpatient.  Alcohol abuse Alcohol withdrawal Withdrawal symptoms resolved.  Hyponatremia Resolved with  IV fluids.  Dehydration Resolved with IV fluids.  Pancreatic tail lesion Favored to be a pseudocyst but indolent cystic neoplasm cannot be ruled out recommendations for pre and post-contrast MRI/MRCP in 1 year.   DVT prophylaxis: Heparin subcu Code Status:   Code Status: Full Code Family Communication: None Disposition Plan: Discharge to SNF when bed available   Consultants:   [facility]  General surgery  Procedures:   None  Antimicrobials:  Vancomycin  Cefepime  Doxycycline   Subjective: Eye irritation, burning, discharge. He has been rubbing his eyes.  Objective: Vitals:   03/31/19 1407 03/31/19 2215 04/01/19 0026 04/01/19 0447  BP: 105/78 103/84 115/83 105/83  Pulse: 83 71 69 74  Resp: 12   17  Temp: 99.1 F (37.3 C) 99.7 F (37.6 C) 98.5 F (36.9 C) 99 F (37.2 C)  TempSrc: Oral Oral Oral Oral  SpO2: 98% 96% 96% 96%  Weight:    78.5 kg  Height:        Intake/Output Summary (Last 24 hours) at 04/01/2019 1315 Last data filed at 04/01/2019 1200 Gross per 24 hour  Intake 240 ml  Output 1300 ml  Net -1060 ml   Filed Weights   03/30/19 0650 03/31/19 0345 04/01/19 0447  Weight: 73.5 kg 78.6 kg 78.5 kg    Examination:  General exam: Appears calm and comfortable Eyes: conjunctival injection, normal EOMI, no discharge seen Respiratory system: Clear to auscultation. Respiratory effort normal. Cardiovascular system: S1 & S2 heard, RRR. No murmurs, rubs, gallops or clicks. Gastrointestinal system: Abdomen is nondistended, soft and mildly tender generally. No organomegaly or masses felt. Normal bowel sounds heard. Central nervous system: Alert and oriented. No focal neurological deficits. Extremities: No edema. No calf tenderness Skin: No cyanosis. Psychiatry: Judgement and insight appear normal. Mood & affect appropriate.  Data Reviewed: I have personally reviewed following labs and imaging studies  CBC: Recent Labs  Lab 03/26/19  0436 03/27/19 1014 03/30/19 0758  WBC 5.8 6.8 7.0  HGB 11.6* 12.0* 13.0  HCT 33.7* 35.5* 39.3  MCV 107.3* 111.3* 111.6*  PLT 94* 102* 742*   Basic Metabolic Panel: Recent Labs  Lab 03/26/19 0436 03/27/19 1014 03/30/19 0758  NA 135 135 137  K 3.7 4.1 4.0  CL 104 104 104  CO2 25 26 25   GLUCOSE 94 116* 88  BUN <5* <5* 5*  CREATININE 0.56* 0.63 0.73  CALCIUM 7.7* 8.2* 8.7*  MG 1.8 1.8  --   PHOS 2.8  --   --    GFR: Estimated Creatinine Clearance: 104.3 mL/min (by C-G formula based on SCr of 0.73 mg/dL). Liver Function Tests: Recent Labs  Lab 03/26/19 0436 03/27/19 1014  AST 78* 84*  ALT 33 32  ALKPHOS 110 113  BILITOT 1.1 0.9  PROT 5.4* 5.5*  ALBUMIN 2.2* 2.4*   No results for input(s): LIPASE, AMYLASE in the last 168 hours. No results for input(s): AMMONIA in the last 168 hours. Coagulation Profile: No results for input(s): INR, PROTIME in the last 168 hours. Cardiac Enzymes: Recent Labs  Lab 03/31/19 1650  TROPONINI <0.03   BNP (last 3 results) No results for input(s): PROBNP in the last 8760 hours. HbA1C: No results for input(s): HGBA1C in the last 72 hours. CBG: No results for input(s): GLUCAP in the last 168 hours. Lipid Profile: No results for input(s): CHOL, HDL, LDLCALC, TRIG, CHOLHDL, LDLDIRECT in the last 72 hours. Thyroid Function Tests: No results for input(s): TSH, T4TOTAL, FREET4, T3FREE, THYROIDAB in the last 72 hours. Anemia Panel: No results for input(s): VITAMINB12, FOLATE, FERRITIN, TIBC, IRON, RETICCTPCT in the last 72 hours. Sepsis Labs: No results for input(s): PROCALCITON, LATICACIDVEN in the last 168 hours.  Recent Results (from the past 240 hour(s))  Blood Culture (routine x 2)     Status: Abnormal   Collection Time: 03/23/19  4:00 PM   Specimen: BLOOD  Result Value Ref Range Status   Specimen Description   Final    BLOOD RIGHT ANTECUBITAL Performed at [facility], [place] [address], [place], [place] [address]     Special Requests   Final    BOTTLES DRAWN AEROBIC AND ANAEROBIC Blood Culture adequate volume Performed at [facility], [address] [place], [place] [address]    Culture  Setup Time   Final    GRAM POSITIVE COCCI AEROBIC BOTTLE ONLY CRITICAL VALUE NOTED.  VALUE IS CONSISTENT WITH PREVIOUSLY REPORTED AND CALLED VALUE. GRAM POSITIVE RODS ANAEROBIC BOTTLE ONLY CRITICAL RESULT CALLED TO, READ BACK BY AND VERIFIED WITH: J. LEDFORD,PHARMD 2257 03/25/2019 T. TYSOR    Culture (A)  Final    STAPHYLOCOCCUS SPECIES (COAGULASE NEGATIVE) SUSCEPTIBILITIES PERFORMED ON PREVIOUS CULTURE WITHIN THE LAST 5 DAYS. BACILLUS SPECIES Standardized susceptibility testing for this organism is not available. Performed at [facility], [place] [address] [place], [place] [address]    Report Status 03/26/2019 FINAL  Final  Urine culture     Status: None   Collection Time: 03/23/19  4:06 PM   Specimen: Urine, Clean Catch  Result Value Ref Range Status   Specimen Description   Final    URINE, CLEAN CATCH Performed at [facility], [address] [place], [place] [address]    Special Requests   Final    NONE Performed  at [facility] [place], [address] [place], [place] [address]    Culture   Final    NO GROWTH Performed at [facility], [address] [place], [place] [address]    Report Status 03/25/2019 FINAL  Final  Blood Culture (routine x 2)     Status: Abnormal   Collection Time: 03/23/19  5:00 PM   Specimen: BLOOD RIGHT FOREARM  Result Value Ref Range Status   Specimen Description   Final    BLOOD RIGHT FOREARM Performed at [facility] [place], [address] [place], [place] [address]    Special Requests   Final    BOTTLES DRAWN AEROBIC AND ANAEROBIC Blood Culture adequate volume Performed at [facility] [place], [address] [place], [place] [address]    Culture  Setup Time   Final    GRAM POSITIVE COCCI AEROBIC BOTTLE ONLY  CRITICAL RESULT CALLED TO, READ BACK BY AND VERIFIED WITH: C WHITESIDE AT 1400 ON [id number] BY SJW Performed at [facility], [address] [place], [place] [address]    Culture STAPHYLOCOCCUS SPECIES (COAGULASE NEGATIVE) (A)  Final   Report Status 03/26/2019 FINAL  Final   Organism ID, Bacteria STAPHYLOCOCCUS SPECIES (COAGULASE NEGATIVE)  Final      Susceptibility   Staphylococcus species (coagulase negative) - MIC*    CIPROFLOXACIN <=0.5 SENSITIVE Sensitive     ERYTHROMYCIN <=0.25 SENSITIVE Sensitive     GENTAMICIN <=0.5 SENSITIVE Sensitive     OXACILLIN <=0.25 SENSITIVE Sensitive     TETRACYCLINE <=1 SENSITIVE Sensitive     VANCOMYCIN 1 SENSITIVE Sensitive     TRIMETH/SULFA <=10 SENSITIVE Sensitive     CLINDAMYCIN <=0.25 SENSITIVE Sensitive     RIFAMPIN <=0.5 SENSITIVE Sensitive     Inducible Clindamycin NEGATIVE Sensitive     * STAPHYLOCOCCUS SPECIES (COAGULASE NEGATIVE)  Blood Culture ID Panel (Reflexed)     Status: Abnormal   Collection Time: 03/23/19  5:00 PM  Result Value Ref Range Status   Enterococcus species NOT DETECTED NOT DETECTED Final   Listeria monocytogenes NOT DETECTED NOT DETECTED Final   Staphylococcus species DETECTED (A) NOT DETECTED Final    Comment: Methicillin (oxacillin) resistant coagulase negative staphylococcus. Possible blood culture contaminant (unless isolated from more than one blood culture draw or clinical case suggests pathogenicity). No antibiotic treatment is indicated for blood  culture contaminants. CRITICAL RESULT CALLED TO, READ BACK BY AND VERIFIED WITH: WHITESIDE,C AT 1400 ON [id number] BY SJW    Staphylococcus aureus (BCID) NOT DETECTED NOT DETECTED Final   Methicillin resistance DETECTED (A) NOT DETECTED Final    Comment: CRITICAL RESULT CALLED TO, READ BACK BY AND VERIFIED WITH: WHITESIDE, C PHARMD AT 1400 ON [id number] BY SJW    Streptococcus species NOT DETECTED NOT DETECTED Final   Streptococcus agalactiae NOT DETECTED NOT DETECTED  Final   Streptococcus pneumoniae NOT DETECTED NOT DETECTED Final   Streptococcus pyogenes NOT DETECTED NOT DETECTED Final   Acinetobacter baumannii NOT DETECTED NOT DETECTED Final   Enterobacteriaceae species NOT DETECTED NOT DETECTED Final   Enterobacter cloacae complex NOT DETECTED NOT DETECTED Final   Escherichia coli NOT DETECTED NOT DETECTED Final   Klebsiella oxytoca NOT DETECTED NOT DETECTED Final   Klebsiella pneumoniae NOT DETECTED NOT DETECTED Final   Proteus species NOT DETECTED NOT DETECTED Final   Serratia marcescens NOT DETECTED NOT DETECTED Final   Haemophilus influenzae NOT DETECTED NOT DETECTED Final   Neisseria meningitidis NOT DETECTED  NOT DETECTED Final   Pseudomonas aeruginosa NOT DETECTED NOT DETECTED Final   Candida albicans NOT DETECTED NOT DETECTED Final   Candida glabrata NOT DETECTED NOT DETECTED Final   Candida krusei NOT DETECTED NOT DETECTED Final   Candida parapsilosis NOT DETECTED NOT DETECTED Final   Candida tropicalis NOT DETECTED NOT DETECTED Final    Comment: Performed at [facility], [address] [place], [place] [address]  SARS Coronavirus 2 (Hosp order,Performed in [facility] lab via Abbott ID)     Status: None   Collection Time: 03/23/19  5:41 PM   Specimen: Dry Nasal Swab (Abbott ID Now)  Result Value Ref Range Status   SARS Coronavirus 2 (Abbott ID Now) NEGATIVE NEGATIVE Final    Comment: (NOTE) Interpretive Result Comment(s): COVID 19 Positive SARS CoV 2 target nucleic acids are DETECTED. The SARS CoV 2 RNA is generally detectable in upper and lower respiratory specimens during the acute phase of infection.  Positive results are indicative of active infection with SARS CoV 2.  Clinical correlation with patient history and other diagnostic information is necessary to determine patient infection status.  Positive results do not rule out bacterial infection or coinfection with other viruses. The expected result is Negative. COVID  19 Negative SARS CoV 2 target nucleic acids are NOT DETECTED. The SARS CoV 2 RNA is generally detectable in upper and lower respiratory specimens during the acute phase of infection.  Negative results do not preclude SARS CoV 2 infection, do not rule out coinfections with other pathogens, and should not be used as the sole basis for treatment or other patient management decisions.  Negative results must be combined with clinical  observations, patient history, and epidemiological information. The expected result is Negative. Invalid Presence or absence of SARS CoV 2 nucleic acids cannot be determined. Repeat testing was performed on the submitted specimen and repeated Invalid results were obtained.  If clinically indicated, additional testing on a new specimen with an alternate test methodology [phone]) is advised.  The SARS CoV 2 RNA is generally detectable in upper and lower respiratory specimens during the acute phase of infection. The expected result is Negative. Fact Sheet for Patients:  http://www.graves-ford.org/ Fact Sheet for Healthcare Providers: EnviroConcern.si This test is not yet approved or cleared by the [place] FDA and has been authorized for detection and/or diagnosis of SARS CoV 2 by FDA under an Emergency Use Authorization (EUA).  This EUA will remain in effect (meaning this test can be used) for the duration of the COVID19 d eclaration under Section 564(b)(1) of the Act, 21 U.S.C. section [phone] 3(b)(1), unless the authorization is terminated or revoked sooner. Performed at [facility], [address] [place], [place] [address]   SARS Coronavirus 2     Status: None   Collection Time: 03/24/19 12:55 AM  Result Value Ref Range Status   SARS Coronavirus 2 NOT DETECTED NOT DETECTED Final    Comment: (NOTE) SARS-CoV-2 target nucleic acids are NOT DETECTED. The SARS-CoV-2 RNA is generally detectable in  upper and lower respiratory specimens during the acute phase of infection.  Negative  results do not preclude SARS-CoV-2 infection, do not rule out co-infections with other pathogens, and should not be used as the sole basis for treatment or other patient management decisions.  Negative results must be combined with clinical observations, patient history, and epidemiological information. The expected result is Not Detected. Fact Sheet for Patients: http://www.biofiredefense.com/wp-content/uploads/2020/03/BIOFIRE-COVID -19-patients.pdf Fact Sheet for Healthcare Providers:  http://www.biofiredefense.com/wp-content/uploads/2020/03/BIOFIRE-COVID -19-hcp.pdf This test is not yet approved or cleared by the [place] FDA and  has been authorized for detection and/or diagnosis of SARS-CoV-2 by FDA under an Emergency Use Authorization (EUA).  This EUA will remain in effec t (meaning this test can be used) for the duration of  the COVID-19 declaration under Section 564(b)(1) of the Act, 21 U.S.C. section 360bbb-3(b)(1), unless the authorization is terminated or revoked sooner. Performed at [facility], [address] [place], [place] [address]   Culture, blood (Routine X 2) w Reflex to ID Panel     Status: None   Collection Time: 03/25/19 11:36 AM   Specimen: BLOOD  Result Value Ref Range Status   Specimen Description BLOOD LEFT ANTECUBITAL  Final   Special Requests AEROBIC BOTTLE ONLY Blood Culture adequate volume  Final   Culture   Final    NO GROWTH 5 DAYS Performed at [facility], [address] [place], [place] [address]    Report Status 03/30/2019 FINAL  Final  Culture, blood (Routine X 2) w Reflex to ID Panel     Status: None   Collection Time: 03/25/19 11:41 AM   Specimen: BLOOD RIGHT HAND  Result Value Ref Range Status   Specimen Description BLOOD RIGHT HAND  Final   Special Requests AEROBIC BOTTLE ONLY Blood Culture adequate volume  Final   Culture   Final     NO GROWTH 5 DAYS Performed at [facility], [address] [place], [place] [address]    Report Status 03/30/2019 FINAL  Final  Surgical pcr screen     Status: None   Collection Time: 03/29/19  1:47 PM   Specimen: Nasal Mucosa; Nasal Swab  Result Value Ref Range Status   MRSA, PCR NEGATIVE NEGATIVE Final   Staphylococcus aureus NEGATIVE NEGATIVE Final    Comment: (NOTE) The Xpert SA Assay (FDA approved for NASAL specimens in patients 56 years of age and older), is one component of a comprehensive surveillance program. It is not intended to diagnose infection nor to guide or monitor treatment. Performed at [facility], [address] [place], [place] [address]          Radiology Studies: Dg Chest 2 View  Result Date: 03/31/2019 CLINICAL DATA:  Anterior chest tightness for a few days. EXAM: CHEST - 2 VIEW COMPARISON:  None. FINDINGS: The heart size and mediastinal contours are within normal limits. Both lungs are clear. The visualized skeletal structures are unremarkable. IMPRESSION: No active cardiopulmonary disease. Electronically Signed   By: Gerome Samavid  Williams III M.D   On: 03/31/2019 13:36        Scheduled Meds: . erythromycin   Both Eyes Q6H  . folic acid  1 mg Oral Daily  . heparin  5,000 Units Subcutaneous Q8H  . multivitamin with minerals  1 tablet Oral Daily  . nicotine  21 mg Transdermal Daily  . pantoprazole  40 mg Oral Daily  . potassium & sodium phosphates  1 packet Oral TID WC & HS  . thiamine  100 mg Oral Daily   Continuous Infusions: . lactated ringers 10 mL/hr at 03/29/19 0825     LOS: 8 days     Jacquelin Hawkingalph Nettey, MD [facility] 04/01/2019, 1:15 PM  If 7PM-7AM, please contact night-coverage [email or web address]

## 2019-04-01 NOTE — TOC Initial Note (Signed)
Transition of Care [facility]) - Initial/Assessment Note    Patient Details  Name: Tom Bender MRN: [id number] Date of Birth: [birth year]  Transition of [facility]) CM/SW Contact:    Bary Castilla, LCSW Phone Number: 04/01/2019, 10:50 AM  Clinical Narrative:  CSW met with patient bedside to discuss the OT recommendation of a SNF. Patient is agreeable to a SNF due to him stating that he needs assistance and has weakness.CSW verified patient's insurance and he informed CSW that he has not had insurance for several years. Patient stated that he would like to be close to his sister's house in [place] where he is currently living however would go where he is accepted. Patient does not have insurance and CSW will need to follow up with financial counselors.   Expected Discharge Plan: Skilled Nursing Facility Barriers to Discharge: Continued Medical Work up, SNF Pending payor source - LOG   Patient Goals and CMS Choice Patient states their goals for this hospitalization and ongoing recovery are:: to get back to where I can walk CMS Medicare.gov Compare Post Acute Care list provided to:: Patient Choice offered to / list presented to : Patient  Expected Discharge Plan and Services Expected Discharge Plan: [place]       Living arrangements for the past 2 months: Single Family Home                                      Prior Living Arrangements/Services Living arrangements for the past 2 months: Single Family Home Lives with:: Siblings(sister-[name]) Patient language and need for interpreter reviewed:: Yes        Need for Family Participation in Patient Care: Yes (Comment) Care giver support system in place?: Yes (comment)   Criminal Activity/Legal Involvement Pertinent to Current Situation/Hospitalization: No - Comment as needed  Activities of Daily Living Home Assistive Devices/Equipment: None ADL Screening (condition at time of admission) Patient's cognitive  ability adequate to safely complete daily activities?: Yes Is the patient deaf or have difficulty hearing?: No Does the patient have difficulty seeing, even when wearing glasses/contacts?: No Does the patient have difficulty concentrating, remembering, or making decisions?: No Patient able to express need for assistance with ADLs?: No Does the patient have difficulty dressing or bathing?: No Independently performs ADLs?: Yes (appropriate for developmental age) Does the patient have difficulty walking or climbing stairs?: Yes Weakness of Legs: Both Weakness of Arms/Hands: Both  Permission Sought/Granted Permission sought to share information with : Family Supports Permission granted to share information with : Yes, Verbal Permission Granted  Share Information with NAME: [name]  Permission granted to share info w AGENCY: SNFs  Permission granted to share info w Relationship: sister  Permission granted to share info w Contact Information: lost phone  Emotional Assessment Appearance:: Appears older than stated age Attitude/Demeanor/Rapport: Engaged Affect (typically observed): Accepting Orientation: : Oriented to Self, Oriented to Place, Oriented to  Time, Oriented to Situation Alcohol / Substance Use: Tobacco Use, Alcohol Use Psych Involvement: No (comment)  Admission diagnosis:  Lactic acidosis [E87.2] Precordial chest pain [R07.2] Generalized abdominal pain [R10.84] Flu-like symptoms [R68.89] Patient Active Problem List   Diagnosis Date Noted  . SIRS (systemic inflammatory response syndrome) ([place]) 03/23/2019  . Hyponatremia 03/23/2019  . Hypokalemia 03/23/2019  . Benign essential HTN 03/23/2019  . Alcohol abuse 03/23/2019  . Lactic acidosis 03/23/2019  . Pancreatic cyst 03/23/2019   PCP:  Patient, No  Pcp Per Pharmacy:   [facility] [address], [place] [address] Phone: [phone] Fax: [phone]  [facility]  [place], [place] - [address] [place] [address] Phone: [phone] Fax: [phone]     Social Determinants of Health (SDOH) Interventions    Readmission Risk Interventions No flowsheet data found.

## 2019-04-02 LAB — BASIC METABOLIC PANEL
Anion gap: 6 (ref 5–15)
BUN: <5 mg/dL — ABNORMAL LOW (ref 6–20)
CO2: 27 mmol/L (ref 22–32)
Calcium: 8.4 mg/dL — ABNORMAL LOW (ref 8.9–10.3)
Chloride: 104 mmol/L (ref 98–111)
Creatinine, Ser: 0.69 mg/dL (ref 0.61–1.24)
GFR calc Af Amer: >60 mL/min (ref >60)
GFR calc non Af Amer: >60 mL/min (ref >60)
Glucose, Bld: 111 mg/dL — ABNORMAL HIGH (ref 70–99)
Potassium: 4 mmol/L (ref 3.5–5.1)
Sodium: 137 mmol/L (ref 135–145)

## 2019-04-02 MED ORDER — CIPROFLOXACIN HCL 0.3 % OP SOLN
1.0000 [drp] | Freq: Four times a day (QID) | OPHTHALMIC | Status: DC
  Administered 2019-04-02 – 2019-04-05 (×12): 1 [drp] via OPHTHALMIC
  Filled 2019-04-02 (×2): qty 2.5

## 2019-04-02 NOTE — Progress Notes (Signed)
PROGRESS NOTE    Tom Bender  [id number] DOB: 1961-05-24 DOA: 03/23/2019 PCP: Patient, No Pcp Per   Brief Narrative: Tom Bender is a 58 y.o. male with a history of alcohol and tobacco abuse, CKD stage II, hypertension. He presented secondary to weakness and aches and found to meet sepsis criteria with evidence of bacteremia. Abdominal pain is also present with concern for possible acute cholecystitis vs gastritis. GI on board. On IV Vancomycin. Underwent cholecystectomy for possible cholecystitis.   Assessment & Plan:   Principal Problem:   SIRS (systemic inflammatory response syndrome) (HCC) Active Problems:   Hyponatremia   Hypokalemia   Benign essential HTN   Alcohol abuse   Lactic acidosis   Pancreatic cyst   Sepsis Bacteremia source. Present on admission.  Physiology improved.  Coagulase negative MR staph bacteremia Unknown source. 2/4 bottles. Repeat blood cultures (6/7) no growth to date. Completed antibiotic course.  Upper abdominal pain Concern for possible gallbladder etiology vs gastritis. HIDA scan obtained and was negative. MRCP significant for gallbladder wall thickening. GI consulted and patient underwent EGD. General surgery consulted for cholecystectomy which occurred on 6/11. Sharp pain that radiated to back has resolved since surgery.  Chest tightness Unsure of etiology. EKG unremarkable, troponin negative, D-dimer appears stable and previous CTA chest negative for a PE. Possibly related to some reflux -Continue PPI -Continue Maalox  Conjunctivitis Likely viral but unsure. Significant symptoms of burning. Persistent with erythromycin. Discussed with Dr. Eulas Post, ophthalmology who recommended switching to ciprofloxacin. If no improvement in a few days, re-consult vs outpatient follow-up. -Ciprofloxacin 1 drop both eyes QID  Weakness Generalized. Lightheadedness with standing -Orthostatic vitals -PT recommending SNF  Hypokalemia Hypomagnesemia  Replete as needed.  Essential hypertension Well controlled. Not on medications as an outpatient.  Alcohol abuse Alcohol withdrawal Withdrawal symptoms resolved.  Hyponatremia Resolved with IV fluids.  Dehydration Resolved with IV fluids.  Pancreatic tail lesion Favored to be a pseudocyst but indolent cystic neoplasm cannot be ruled out recommendations for pre and post-contrast MRI/MRCP in 1 year.   DVT prophylaxis: Heparin subcu Code Status:   Code Status: Full Code Family Communication: None Disposition Plan: Discharge to SNF when bed available   Consultants:   [facility]  General surgery  Procedures:   None  Antimicrobials:  Vancomycin  Cefepime  Doxycycline   Subjective: Patient still has eye pain/burning with blurry vision and discharge.  Objective: Vitals:   04/01/19 0026 04/01/19 0447 04/01/19 1400 04/01/19 2133  BP: 115/83 105/83 106/78 102/79  Pulse: 69 74 92 78  Resp:  17  16  Temp: 98.5 F (36.9 C) 99 F (37.2 C) 98.8 F (37.1 C) 98.3 F (36.8 C)  TempSrc: Oral Oral Oral Oral  SpO2: 96% 96% 96% 96%  Weight:  78.5 kg    Height:        Intake/Output Summary (Last 24 hours) at 04/02/2019 1100 Last data filed at 04/02/2019 0603 Gross per 24 hour  Intake 960 ml  Output 2040 ml  Net -1080 ml   Filed Weights   03/30/19 0650 03/31/19 0345 04/01/19 0447  Weight: 73.5 kg 78.6 kg 78.5 kg    Examination:  General exam: Appears calm and comfortable Eyes: EOMI, conjunctivitis, no photophobia Respiratory system: Clear to auscultation. Respiratory effort normal. Cardiovascular system: S1 & S2 heard, RRR. No murmurs, rubs, gallops or clicks. Gastrointestinal system: Abdomen is nondistended, soft and mildly tender. No organomegaly or masses felt. Normal bowel sounds heard. Central nervous system: Alert and oriented. No focal  neurological deficits. Extremities: No edema. No calf tenderness Skin: No cyanosis. No rashes Psychiatry:  Judgement and insight appear normal. Mood & affect appropriate.     Data Reviewed: I have personally reviewed following labs and imaging studies  CBC: Recent Labs  Lab 03/27/19 1014 03/30/19 0758  WBC 6.8 7.0  HGB 12.0* 13.0  HCT 35.5* 39.3  MCV 111.3* 111.6*  PLT 102* 147*   Basic Metabolic Panel: Recent Labs  Lab 03/27/19 1014 03/30/19 0758 04/02/19 0452  NA 135 137 137  K 4.1 4.0 4.0  CL 104 104 104  CO2 GLUCOSE 116* 88 111*  BUN <5* 5* <5*  CREATININE 0.63 0.73 0.69  CALCIUM 8.2* 8.7* 8.4*  MG 1.8  --   --    GFR: Estimated Creatinine Clearance: 104.3 mL/min (by C-G formula based on SCr of 0.69 mg/dL). Liver Function Tests: Recent Labs  Lab 03/27/19 1014  AST 84*  ALT 32  ALKPHOS 113  BILITOT 0.9  PROT 5.5*  ALBUMIN 2.4*   No results for input(s): LIPASE, AMYLASE in the last 168 hours. No results for input(s): AMMONIA in the last 168 hours. Coagulation Profile: No results for input(s): INR, PROTIME in the last 168 hours. Cardiac Enzymes: Recent Labs  Lab 03/31/19 1650  TROPONINI <0.03   BNP (last 3 results) No results for input(s): PROBNP in the last 8760 hours. HbA1C: No results for input(s): HGBA1C in the last 72 hours. CBG: No results for input(s): GLUCAP in the last 168 hours. Lipid Profile: No results for input(s): CHOL, HDL, LDLCALC, TRIG, CHOLHDL, LDLDIRECT in the last 72 hours. Thyroid Function Tests: No results for input(s): TSH, T4TOTAL, FREET4, T3FREE, THYROIDAB in the last 72 hours. Anemia Panel: No results for input(s): VITAMINB12, FOLATE, FERRITIN, TIBC, IRON, RETICCTPCT in the last 72 hours. Sepsis Labs: No results for input(s): PROCALCITON, LATICACIDVEN in the last 168 hours.  Recent Results (from the past 240 hour(s))  Blood Culture (routine x 2)     Status: Abnormal   Collection Time: 03/23/19  4:00 PM   Specimen: BLOOD  Result Value Ref Range Status   Specimen Description   Final    BLOOD RIGHT ANTECUBITAL  Performed at [facility], [address], [place], [place] [address]    Special Requests   Final    BOTTLES DRAWN AEROBIC AND ANAEROBIC Blood Culture adequate volume Performed at [facility], [address] [place], [place] [address]    Culture  Setup Time   Final    GRAM POSITIVE COCCI AEROBIC BOTTLE ONLY CRITICAL VALUE NOTED.  VALUE IS CONSISTENT WITH PREVIOUSLY REPORTED AND CALLED VALUE. GRAM POSITIVE RODS ANAEROBIC BOTTLE ONLY CRITICAL RESULT CALLED TO, READ BACK BY AND VERIFIED WITH: J. LEDFORD,PHARMD 2257 03/25/2019 T. TYSOR    Culture (A)  Final    STAPHYLOCOCCUS SPECIES (COAGULASE NEGATIVE) SUSCEPTIBILITIES PERFORMED ON PREVIOUS CULTURE WITHIN THE LAST 5 DAYS. BACILLUS SPECIES Standardized susceptibility testing for this organism is not available. Performed at [facility], [address] [place], [place] [address]    Report Status 03/26/2019 FINAL  Final  Urine culture     Status: None   Collection Time: 03/23/19  4:06 PM   Specimen: Urine, Clean Catch  Result Value Ref Range Status   Specimen Description   Final    URINE, CLEAN CATCH Performed at [facility], [address] [place], [place] [address]    Special Requests   Final  NONE Performed at [facility] [place], [address] [place] [address] [place], [place] [address]    Culture   Final    NO GROWTH Performed at [facility], [address] [place], [place] [address]    Report Status 03/25/2019 FINAL  Final  Blood Culture (routine x 2)     Status: Abnormal   Collection Time: 03/23/19  5:00 PM   Specimen: BLOOD RIGHT FOREARM  Result Value Ref Range Status   Specimen Description   Final    BLOOD RIGHT FOREARM Performed at [facility] [place], [address] [place], [place] [address]    Special Requests   Final    BOTTLES DRAWN AEROBIC AND ANAEROBIC Blood Culture adequate volume Performed at [facility] [place], [address] [place] [address] [place], [place] [address]     Culture  Setup Time   Final    GRAM POSITIVE COCCI AEROBIC BOTTLE ONLY CRITICAL RESULT CALLED TO, READ BACK BY AND VERIFIED WITH: C WHITESIDE AT 1400 ON [id number] BY SJW Performed at [facility], [address] [place], [place] [address]    Culture STAPHYLOCOCCUS SPECIES (COAGULASE NEGATIVE) (A)  Final   Report Status 03/26/2019 FINAL  Final   Organism ID, Bacteria STAPHYLOCOCCUS SPECIES (COAGULASE NEGATIVE)  Final      Susceptibility   Staphylococcus species (coagulase negative) - MIC*    CIPROFLOXACIN <=0.5 SENSITIVE Sensitive     ERYTHROMYCIN <=0.25 SENSITIVE Sensitive     GENTAMICIN <=0.5 SENSITIVE Sensitive     OXACILLIN <=0.25 SENSITIVE Sensitive     TETRACYCLINE <=1 SENSITIVE Sensitive     VANCOMYCIN 1 SENSITIVE Sensitive     TRIMETH/SULFA <=10 SENSITIVE Sensitive     CLINDAMYCIN <=0.25 SENSITIVE Sensitive     RIFAMPIN <=0.5 SENSITIVE Sensitive     Inducible Clindamycin NEGATIVE Sensitive     * STAPHYLOCOCCUS SPECIES (COAGULASE NEGATIVE)  Blood Culture ID Panel (Reflexed)     Status: Abnormal   Collection Time: 03/23/19  5:00 PM  Result Value Ref Range Status   Enterococcus species NOT DETECTED NOT DETECTED Final   Listeria monocytogenes NOT DETECTED NOT DETECTED Final   Staphylococcus species DETECTED (A) NOT DETECTED Final    Comment: Methicillin (oxacillin) resistant coagulase negative staphylococcus. Possible blood culture contaminant (unless isolated from more than one blood culture draw or clinical case suggests pathogenicity). No antibiotic treatment is indicated for blood  culture contaminants. CRITICAL RESULT CALLED TO, READ BACK BY AND VERIFIED WITH: WHITESIDE,C AT 1400 ON [id number] BY SJW    Staphylococcus aureus (BCID) NOT DETECTED NOT DETECTED Final   Methicillin resistance DETECTED (A) NOT DETECTED Final    Comment: CRITICAL RESULT CALLED TO, READ BACK BY AND VERIFIED WITH: WHITESIDE, C PHARMD AT 1400 ON [id number] BY SJW    Streptococcus species NOT  DETECTED NOT DETECTED Final   Streptococcus agalactiae NOT DETECTED NOT DETECTED Final   Streptococcus pneumoniae NOT DETECTED NOT DETECTED Final   Streptococcus pyogenes NOT DETECTED NOT DETECTED Final   Acinetobacter baumannii NOT DETECTED NOT DETECTED Final   Enterobacteriaceae species NOT DETECTED NOT DETECTED Final   Enterobacter cloacae complex NOT DETECTED NOT DETECTED Final   Escherichia coli NOT DETECTED NOT DETECTED Final   Klebsiella oxytoca NOT DETECTED NOT DETECTED Final   Klebsiella pneumoniae NOT DETECTED NOT DETECTED Final   Proteus species NOT DETECTED NOT DETECTED Final   Serratia marcescens NOT DETECTED NOT DETECTED Final   Haemophilus influenzae NOT DETECTED NOT DETECTED Final   Neisseria meningitidis  NOT DETECTED NOT DETECTED Final   Pseudomonas aeruginosa NOT DETECTED NOT DETECTED Final   Candida albicans NOT DETECTED NOT DETECTED Final   Candida glabrata NOT DETECTED NOT DETECTED Final   Candida krusei NOT DETECTED NOT DETECTED Final   Candida parapsilosis NOT DETECTED NOT DETECTED Final   Candida tropicalis NOT DETECTED NOT DETECTED Final    Comment: Performed at [facility], [address] [place], [place] [address]  SARS Coronavirus 2 (Hosp order,Performed in [facility] lab via Abbott ID)     Status: None   Collection Time: 03/23/19  5:41 PM   Specimen: Dry Nasal Swab (Abbott ID Now)  Result Value Ref Range Status   SARS Coronavirus 2 (Abbott ID Now) NEGATIVE NEGATIVE Final    Comment: (NOTE) Interpretive Result Comment(s): COVID 19 Positive SARS CoV 2 target nucleic acids are DETECTED. The SARS CoV 2 RNA is generally detectable in upper and lower respiratory specimens during the acute phase of infection.  Positive results are indicative of active infection with SARS CoV 2.  Clinical correlation with patient history and other diagnostic information is necessary to determine patient infection status.  Positive results do not rule out bacterial  infection or coinfection with other viruses. The expected result is Negative. COVID 19 Negative SARS CoV 2 target nucleic acids are NOT DETECTED. The SARS CoV 2 RNA is generally detectable in upper and lower respiratory specimens during the acute phase of infection.  Negative results do not preclude SARS CoV 2 infection, do not rule out coinfections with other pathogens, and should not be used as the sole basis for treatment or other patient management decisions.  Negative results must be combined with clinical  observations, patient history, and epidemiological information. The expected result is Negative. Invalid Presence or absence of SARS CoV 2 nucleic acids cannot be determined. Repeat testing was performed on the submitted specimen and repeated Invalid results were obtained.  If clinically indicated, additional testing on a new specimen with an alternate test methodology [id number]) is advised.  The SARS CoV 2 RNA is generally detectable in upper and lower respiratory specimens during the acute phase of infection. The expected result is Negative. Fact Sheet for Patients:  http://www.graves-ford.org/https://www.fda.gov/media/136524/download Fact Sheet for Healthcare Providers: EnviroConcern.sihttps://www.fda.gov/media/136523/download This test is not yet approved or cleared by the [place] FDA and has been authorized for detection and/or diagnosis of SARS CoV 2 by FDA under an Emergency Use Authorization (EUA).  This EUA will remain in effect (meaning this test can be used) for the duration of the COVID19 d eclaration under Section 564(b)(1) of the Act, 21 U.S.C. section [id number] 3(b)(1), unless the authorization is terminated or revoked sooner. Performed at [facility] [place], [address] [place] [address] [place], [place] [address]   SARS Coronavirus 2     Status: None   Collection Time: 03/24/19 12:55 AM  Result Value Ref Range Status   SARS Coronavirus 2 NOT DETECTED NOT DETECTED Final    Comment: (NOTE) SARS-CoV-2  target nucleic acids are NOT DETECTED. The SARS-CoV-2 RNA is generally detectable in upper and lower respiratory specimens during the acute phase of infection.  Negative  results do not preclude SARS-CoV-2 infection, do not rule out co-infections with other pathogens, and should not be used as the sole basis for treatment or other patient management decisions.  Negative results must be combined with clinical observations, patient history, and epidemiological information. The expected result is Not Detected. Fact Sheet for Patients: http://www.biofiredefense.com/wp-content/uploads/2020/03/BIOFIRE-COVID -19-patients.pdf Fact Sheet for  Healthcare Providers: http://www.biofiredefense.com/wp-content/uploads/2020/03/BIOFIRE-COVID -19-hcp.pdf This test is not yet approved or cleared by the [place] FDA and  has been authorized for detection and/or diagnosis of SARS-CoV-2 by FDA under an Emergency Use Authorization (EUA).  This EUA will remain in effec t (meaning this test can be used) for the duration of  the COVID-19 declaration under Section 564(b)(1) of the Act, 21 U.S.C. section 360bbb-3(b)(1), unless the authorization is terminated or revoked sooner. Performed at [facility], [address] [place], [place] [address]   Culture, blood (Routine X 2) w Reflex to ID Panel     Status: None   Collection Time: 03/25/19 11:36 AM   Specimen: BLOOD  Result Value Ref Range Status   Specimen Description BLOOD LEFT ANTECUBITAL  Final   Special Requests AEROBIC BOTTLE ONLY Blood Culture adequate volume  Final   Culture   Final    NO GROWTH 5 DAYS Performed at [facility], [address] [place], [place] [address]    Report Status 03/30/2019 FINAL  Final  Culture, blood (Routine X 2) w Reflex to ID Panel     Status: None   Collection Time: 03/25/19 11:41 AM   Specimen: BLOOD RIGHT HAND  Result Value Ref Range Status   Specimen Description BLOOD RIGHT HAND  Final    Special Requests AEROBIC BOTTLE ONLY Blood Culture adequate volume  Final   Culture   Final    NO GROWTH 5 DAYS Performed at  [facility], [address] [place], [place] [address]    Report Status 03/30/2019 FINAL  Final  Surgical pcr screen     Status: None   Collection Time: 03/29/19  1:47 PM   Specimen: Nasal Mucosa; Nasal Swab  Result Value Ref Range Status   MRSA, PCR NEGATIVE NEGATIVE Final   Staphylococcus aureus NEGATIVE NEGATIVE Final    Comment: (NOTE) The Xpert SA Assay (FDA approved for NASAL specimens in patients 56 years of age and older), is one component of a comprehensive surveillance program. It is not intended to diagnose infection nor to guide or monitor treatment. Performed at [facility], [address] [place], [place] [address]          Radiology Studies: Dg Chest 2 View  Result Date: 03/31/2019 CLINICAL DATA:  Anterior chest tightness for a few days. EXAM: CHEST - 2 VIEW COMPARISON:  None. FINDINGS: The heart size and mediastinal contours are within normal limits. Both lungs are clear. The visualized skeletal structures are unremarkable. IMPRESSION: No active cardiopulmonary disease. Electronically Signed   By: Gerome Samavid  Williams III M.D   On: 03/31/2019 13:36        Scheduled Meds: . ciprofloxacin  1 drop Both Eyes QID  . folic acid  1 mg Oral Daily  . heparin  5,000 Units Subcutaneous Q8H  . multivitamin with minerals  1 tablet Oral Daily  . nicotine  21 mg Transdermal Daily  . pantoprazole  40 mg Oral Daily  . potassium & sodium phosphates  1 packet Oral TID WC & HS  . thiamine  100 mg Oral Daily   Continuous Infusions: . lactated ringers 10 mL/hr at 03/29/19 0825     LOS: 9 days     Jacquelin Hawkingalph Aadith Raudenbush, MD [facility] 04/02/2019, 11:00 AM  If 7PM-7AM, please contact night-coverage [email or web address]

## 2019-04-02 NOTE — Progress Notes (Signed)
Physical Therapy Treatment Patient Details Name: Tom Bender MRN: [id number] DOB: 03-23-1961 Today's Date: 04/02/2019    History of Present Illness 58 y.o. male with medical history significant of alcohol abuse, tobacco abuse, chronic kidney disease stage II, hypertension, presenting with progressive weakness body aches headaches chest pain and shortness of breath, weakness and falls, along with 8/10 abdominal pain. Pt admitted 6/5 for evaluation for SIRS. s/p cholecystectomy 6/11    PT Comments    Pt eager to get out of bed with therapy, and is making good progress towards his goals however continues to be limited in safe mobility by decreased safety awareness as well as decreased strength and balance. Pt is min guard for bed mobility and transfers and min A for ambulation with RW and HHA. Pt continues to be impulsive in his movements with decreased awareness of line and leads. D/c plans remain appropriate at this time. PT will continue to follow acutely.  Follow Up Recommendations  SNF     Equipment Recommendations  Other (comment)(TBD at next venue)       Precautions / Restrictions Precautions Precautions: Fall Precaution Comments: watch HR, hx of falling 3 in last month Restrictions Weight Bearing Restrictions: No    Mobility  Bed Mobility Overal bed mobility: Needs Assistance Bed Mobility: Supine to Sit     Supine to sit: Min guard     General bed mobility comments: min guard for safety, increased time and effort required  Transfers Overall transfer level: Needs assistance Equipment used: Rolling walker (2 wheeled) Transfers: Sit to/from Stand Sit to Stand: Min guard         General transfer comment: min guard for power up and steadying, vc for waiting a moment prior to ambulation due to history of dizziness with standing  Ambulation/Gait Ambulation/Gait assistance: Min assist Gait Distance (Feet): 60 Feet Assistive device: Rolling walker (2 wheeled);2 person  hand held assist Gait Pattern/deviations: Step-through pattern;Decreased step length - right;Decreased step length - left;Trunk flexed;Scissoring;Ataxic Gait velocity: slowed Gait velocity interpretation: <1.8 ft/sec, indicate of risk for recurrent falls General Gait Details: pt with decreased knowledge of RW and utilizes increased UE support to maintain balance, vc for proximity to slightly ataxic, gait with scissoring and mild instability         Balance Overall balance assessment: Needs assistance Sitting-balance support: Feet supported;No upper extremity supported Sitting balance-Leahy Scale: Fair     Standing balance support: Bilateral upper extremity supported;During functional activity Standing balance-Leahy Scale: Poor Standing balance comment: requires UE support to maintain balance, tremulous in static standing                            Cognition Arousal/Alertness: Awake/alert Behavior During Therapy: Impulsive Overall Cognitive Status: Impaired/Different from baseline Area of Impairment: Following commands;Problem solving;Safety/judgement                       Following Commands: Follows multi-step commands with increased time Safety/Judgement: Decreased awareness of safety   Problem Solving: Slow processing;Requires verbal cues;Requires tactile cues General Comments: pt continues to be impulsive, jumping up before therapist ready         General Comments General comments (skin integrity, edema, etc.): VSS      Pertinent Vitals/Pain Pain Assessment: Faces Faces Pain Scale: Hurts even more Pain Location: abdomen Pain Descriptors / Indicators: Constant;Sharp Pain Intervention(s): Limited activity within patient's tolerance;Monitored during session;Repositioned  PT Goals (current goals can now be found in the care plan section) Acute Rehab PT Goals Patient Stated Goal: have less pain PT Goal Formulation: With patient Time For  Goal Achievement: 04/10/19 Potential to Achieve Goals: Fair Progress towards PT goals: Progressing toward goals    Frequency    Min 2X/week      PT Plan Current plan remains appropriate       AM-PAC PT "6 Clicks" Mobility   Outcome Measure  Help needed turning from your back to your side while in a flat bed without using bedrails?: None Help needed moving from lying on your back to sitting on the side of a flat bed without using bedrails?: None Help needed moving to and from a bed to a chair (including a wheelchair)?: A Little Help needed standing up from a chair using your arms (e.g., wheelchair or bedside chair)?: A Little Help needed to walk in hospital room?: A Little Help needed climbing 3-5 steps with a railing? : Total 6 Click Score: 18    End of Session Equipment Utilized During Treatment: Gait belt Activity Tolerance: Patient limited by fatigue Patient left: in chair;with call bell/phone within reach;with chair alarm set Nurse Communication: Mobility status PT Visit Diagnosis: Unsteadiness on feet (R26.81);Other abnormalities of gait and mobility (R26.89);Repeated falls (R29.6);Muscle weakness (generalized) (M62.81);History of falling (Z91.81);Difficulty in walking, not elsewhere classified (R26.2);Pain Pain - part of body: (abdomen)     Time: 9169-4503 PT Time Calculation (min) (ACUTE ONLY): 17 min  Charges:  $Gait Training: 8-22 mins                     Kyrell Ruacho B. Migdalia Dk PT, DPT Acute Rehabilitation Services Pager [phone] Office [phone]    [place] 04/02/2019, 4:57 PM

## 2019-04-03 ENCOUNTER — Inpatient Hospital Stay (HOSPITAL_COMMUNITY): Payer: Self-pay

## 2019-04-03 LAB — COMPREHENSIVE METABOLIC PANEL
ALT: 45 U/L — ABNORMAL HIGH (ref 0–44)
AST: 93 U/L — ABNORMAL HIGH (ref 15–41)
Albumin: 2.5 g/dL — ABNORMAL LOW (ref 3.5–5.0)
Alkaline Phosphatase: 197 U/L — ABNORMAL HIGH (ref 38–126)
Anion gap: 6 (ref 5–15)
BUN: <5 mg/dL — ABNORMAL LOW (ref 6–20)
CO2: 27 mmol/L (ref 22–32)
Calcium: 8.7 mg/dL — ABNORMAL LOW (ref 8.9–10.3)
Chloride: 103 mmol/L (ref 98–111)
Creatinine, Ser: 0.86 mg/dL (ref 0.61–1.24)
GFR calc Af Amer: >60 mL/min (ref >60)
GFR calc non Af Amer: >60 mL/min (ref >60)
Glucose, Bld: 106 mg/dL — ABNORMAL HIGH (ref 70–99)
Potassium: 3.9 mmol/L (ref 3.5–5.1)
Sodium: 136 mmol/L (ref 135–145)
Total Bilirubin: 0.9 mg/dL (ref 0.3–1.2)
Total Protein: 6.5 g/dL (ref 6.5–8.1)

## 2019-04-03 LAB — SEDIMENTATION RATE: Sed Rate: 18 mm/hr — ABNORMAL HIGH (ref 0–16)

## 2019-04-03 LAB — C-REACTIVE PROTEIN: CRP: 1.7 mg/dL — ABNORMAL HIGH (ref <1.0)

## 2019-04-03 MED ORDER — DIPHENHYDRAMINE HCL 25 MG PO CAPS
25.0000 mg | ORAL_CAPSULE | Freq: Four times a day (QID) | ORAL | Status: DC | PRN
  Administered 2019-04-05: 25 mg via ORAL
  Filled 2019-04-03: qty 1

## 2019-04-03 MED ORDER — DIPHENHYDRAMINE HCL 25 MG PO CAPS
25.0000 mg | ORAL_CAPSULE | Freq: Three times a day (TID) | ORAL | Status: AC
  Administered 2019-04-03 – 2019-04-04 (×4): 25 mg via ORAL
  Filled 2019-04-03 (×4): qty 1

## 2019-04-03 NOTE — TOC Progression Note (Signed)
Transition of Care [facility]) - Progression Note    Patient Details  Name: Tom Bender MRN: [id number] Date of Birth: 12/25/1960  Transition of Care [facility]) CM/SW Contact  Eileen Stanford, LCSW Phone Number: 04/03/2019, 1:03 PM  Clinical Narrative:   CSW staffed case with AD. At this time pt is doing too well for a LOG to SNF. AD is agreeable to LOG for home health if pt is unable to get home health through charity. RNCM notified.    Expected Discharge Plan: [place] Barriers to Discharge: Continued Medical Work up, SNF Pending payor source - LOG  Expected Discharge Plan and Services Expected Discharge Plan: [place] arrangements for the past 2 months: Single Family Home                                       Social Determinants of Health (SDOH) Interventions    Readmission Risk Interventions No flowsheet data found.

## 2019-04-03 NOTE — Progress Notes (Signed)
PROGRESS NOTE    Tom Bender  ZOX:[id number] DOB: September 20, 1961 DOA: 03/23/2019 PCP: Patient, No Pcp Per   Brief Narrative: Tom Bender is a 58 y.o. male with a history of alcohol and tobacco abuse, CKD stage II, hypertension. He presented secondary to weakness and aches and found to meet sepsis criteria with evidence of bacteremia. Abdominal pain is also present with concern for possible acute cholecystitis vs gastritis. GI on board. On IV Vancomycin. Underwent cholecystectomy for possible cholecystitis.   Assessment & Plan:   Principal Problem:   SIRS (systemic inflammatory response syndrome) (HCC) Active Problems:   Hyponatremia   Hypokalemia   Benign essential HTN   Alcohol abuse   Lactic acidosis   Pancreatic cyst   Sepsis Bacteremia source. Present on admission.  Physiology improved.  Coagulase negative MR staph bacteremia Unknown source. 2/4 bottles. Repeat blood cultures (6/7) no growth to date. Completed antibiotic course.  Upper abdominal pain Concern for possible gallbladder etiology vs gastritis. HIDA scan obtained and was negative. MRCP significant for gallbladder wall thickening. GI consulted and patient underwent EGD. General surgery consulted for cholecystectomy which occurred on 6/11. Sharp pain that radiated to back has resolved since surgery. Abdominal pain after surgery continues -Abdominal x-ray  Chest tightness Unsure of etiology. EKG unremarkable, troponin negative, D-dimer appears stable and previous CTA chest negative for a PE. Possibly related to some reflux -Continue PPI -Continue Maalox  Conjunctivitis Likely viral but unsure. Significant symptoms of burning. Persistent with erythromycin. Discussed with Dr. Sherrine MaplesGlenn, ophthalmology who recommended switching to ciprofloxacin. If no improvement in a few days, re-consult vs outpatient follow-up. -Ciprofloxacin 1 drop both eyes QID  Rash Scrotal. Appears allergic. -Hydrocortisone cream -Start benadryl   Weakness Generalized. Lightheadedness with standing. Improved. Orthostatic vitals negative -PT recommending SNF  Hypokalemia Hypomagnesemia Replete as needed.  Essential hypertension Well controlled. Not on medications as an outpatient.  Alcohol abuse Alcohol withdrawal Withdrawal symptoms resolved.  Hyponatremia Resolved with IV fluids.  Dehydration Resolved with IV fluids.  Pancreatic tail lesion Favored to be a pseudocyst but indolent cystic neoplasm cannot be ruled out recommendations for pre and post-contrast MRI/MRCP in 1 year.   DVT prophylaxis: Heparin subcu Code Status:   Code Status: Full Code Family Communication: None Disposition Plan: Discharge to SNF when bed available   Consultants:   [facility]  General surgery  Procedures:   None  Antimicrobials:  Vancomycin  Cefepime  Doxycycline   Subjective: Still with eye burning and blurry vision. Some abdominal pain with eating this morning.  Objective: Vitals:   04/02/19 1400 04/02/19 1948 04/03/19 0003 04/03/19 0416  BP: 105/80 106/81 108/87 115/88  Pulse: 80 66 71 71  Resp:      Temp: 98.5 F (36.9 C) 98.2 F (36.8 C) 98 F (36.7 C) 99.2 F (37.3 C)  TempSrc: Oral Oral Oral Oral  SpO2:  95% 98% 96%  Weight:    79.4 kg  Height:        Intake/Output Summary (Last 24 hours) at 04/03/2019 1005 Last data filed at 04/03/2019 0400 Gross per 24 hour  Intake 480 ml  Output 2375 ml  Net -1895 ml   Filed Weights   03/31/19 0345 04/01/19 0447 04/03/19 0416  Weight: 78.6 kg 78.5 kg 79.4 kg    Examination:  General exam: Appears calm and comfortable Respiratory system: Clear to auscultation. Respiratory effort normal. Cardiovascular system: S1 & S2 heard, RRR. No murmurs, rubs, gallops or clicks. Gastrointestinal system: Abdomen is slightly distended, moderately soft and  mildly tender. Normal bowel sounds heard. Central nervous system: Alert and oriented. No focal  neurological deficits. Extremities: No edema. No calf tenderness Skin: No cyanosis. No rashes Psychiatry: Judgement and insight appear normal. Mood & affect appropriate.    Data Reviewed: I have personally reviewed following labs and imaging studies  CBC: Recent Labs  Lab 03/27/19 1014 03/30/19 0758  WBC 6.8 7.0  HGB 12.0* 13.0  HCT 35.5* 39.3  MCV 111.3* 111.6*  PLT 102* 147*   Basic Metabolic Panel: Recent Labs  Lab 03/27/19 1014 03/30/19 0758 04/02/19 0452  NA 135 137 137  K 4.1 4.0 4.0  CL 104 104 104  CO2 26 25 27   GLUCOSE 116* 88 111*  BUN <5* 5* <5*  CREATININE 0.63 0.73 0.69  CALCIUM 8.2* 8.7* 8.4*  MG 1.8  --   --    GFR: Estimated Creatinine Clearance: 104.3 mL/min (by C-G formula based on SCr of 0.69 mg/dL). Liver Function Tests: Recent Labs  Lab 03/27/19 1014  AST 84*  ALT 32  ALKPHOS 113  BILITOT 0.9  PROT 5.5*  ALBUMIN 2.4*   No results for input(s): LIPASE, AMYLASE in the last 168 hours. No results for input(s): AMMONIA in the last 168 hours. Coagulation Profile: No results for input(s): INR, PROTIME in the last 168 hours. Cardiac Enzymes: Recent Labs  Lab 03/31/19 1650  TROPONINI <0.03   BNP (last 3 results) No results for input(s): PROBNP in the last 8760 hours. HbA1C: No results for input(s): HGBA1C in the last 72 hours. CBG: No results for input(s): GLUCAP in the last 168 hours. Lipid Profile: No results for input(s): CHOL, HDL, LDLCALC, TRIG, CHOLHDL, LDLDIRECT in the last 72 hours. Thyroid Function Tests: No results for input(s): TSH, T4TOTAL, FREET4, T3FREE, THYROIDAB in the last 72 hours. Anemia Panel: No results for input(s): VITAMINB12, FOLATE, FERRITIN, TIBC, IRON, RETICCTPCT in the last 72 hours. Sepsis Labs: No results for input(s): PROCALCITON, LATICACIDVEN in the last 168 hours.  Recent Results (from the past 240 hour(s))  Culture, blood (Routine X 2) w Reflex to ID Panel     Status: None   Collection Time:  03/25/19 11:36 AM   Specimen: BLOOD  Result Value Ref Range Status   Specimen Description BLOOD LEFT ANTECUBITAL  Final   Special Requests AEROBIC BOTTLE ONLY Blood Culture adequate volume  Final   Culture   Final    NO GROWTH 5 DAYS Performed at [facility], [address] [place], [place] [address]    Report Status 03/30/2019 FINAL  Final  Culture, blood (Routine X 2) w Reflex to ID Panel     Status: None   Collection Time: 03/25/19 11:41 AM   Specimen: BLOOD RIGHT HAND  Result Value Ref Range Status   Specimen Description BLOOD RIGHT HAND  Final   Special Requests AEROBIC BOTTLE ONLY Blood Culture adequate volume  Final   Culture   Final    NO GROWTH 5 DAYS Performed at [facility], [address] [place], [place] [address]    Report Status 03/30/2019 FINAL  Final  Surgical pcr screen     Status: None   Collection Time: 03/29/19  1:47 PM   Specimen: Nasal Mucosa; Nasal Swab  Result Value Ref Range Status   MRSA, PCR NEGATIVE NEGATIVE Final   Staphylococcus aureus NEGATIVE NEGATIVE Final    Comment: (NOTE) The Xpert SA Assay (FDA approved for NASAL specimens in patients 56 years of age and older), is  one component of a comprehensive surveillance program. It is not intended to diagnose infection nor to guide or monitor treatment. Performed at [facility], [place] [address] [place], [place] [address]          Radiology Studies: No results found.      Scheduled Meds: . ciprofloxacin  1 drop Both Eyes QID  . diphenhydrAMINE  25 mg Oral TID  . folic acid  1 mg Oral Daily  . heparin  5,000 Units Subcutaneous Q8H  . multivitamin with minerals  1 tablet Oral Daily  . nicotine  21 mg Transdermal Daily  . pantoprazole  40 mg Oral Daily  . potassium & sodium phosphates  1 packet Oral TID WC & HS  . thiamine  100 mg Oral Daily   Continuous Infusions: . lactated ringers 10 mL/hr at 03/29/19 0825     LOS: 10 days     Cordelia Poche, MD [facility] 04/03/2019, 10:05 AM  If 7PM-7AM, please contact night-coverage [email or web address]

## 2019-04-03 NOTE — Progress Notes (Signed)
Occupational Therapy Treatment Patient Details Name: Tom Bender MRN: [id number] DOB: 12-21-1960 Today's Date: 04/03/2019    History of present illness 58 y.o. male with medical history significant of alcohol abuse, tobacco abuse, chronic kidney disease stage II, hypertension, presenting with progressive weakness body aches headaches chest pain and shortness of breath, weakness and falls, along with 8/10 abdominal pain. Pt admitted 6/5 for evaluation for SIRS. s/p cholecystectomy 6/11   OT comments  Patient agreeable to participate in OT treatment session this PM. He reports that he walked with therapy this morning and was able to walk to the nurse's station and back. He completed his bathing and dressing this morning also seated on the bed independently. Pt is able to demonstrate basic ADL tasks and toilet transfer at mod I level. Used the RW when ambulating and transferring although used lightly for balance if needed. Pt reports that when his medication wears off he is able to tell and he won't be able to do as much. Pt was able to complete grooming task standing at the sink without VC. Overall, he was able to complete a standing task for 5 minutes without fatiguing or needing a rest break. HR was 100 once seated. All education was completed this date and patient no longer requires OT services acutely. With patient's current level of performance I don't see him needing any follow up OT services although if Home Health OT is ordered for discharge they can evaluate his performance at home.    Follow Up Recommendations  No OT follow up    Equipment Recommendations  None recommended by OT       Precautions / Restrictions Precautions Precautions: Fall Precaution Comments: watch HR, hx of falling 3 in last month Restrictions Weight Bearing Restrictions: No       Mobility Bed Mobility Overal bed mobility: Independent Bed Mobility: Supine to Sit     Supine to sit: Independent         Transfers Overall transfer level: Modified independent Equipment used: Rolling walker (2 wheeled) Transfers: Sit to/from Stand Sit to Stand: Modified independent (Device/Increase time)         General transfer comment: Pt required no physical assistance during transfers or sit to stands. Appeared to only use RW lightly if needed for balance but overall did not need during transfers and standing.        ADL either performed or assessed with clinical judgement   ADL Overall ADL's : Modified independent     Grooming: Oral care;Standing;Modified independent                   Toilet Transfer: Modified Independent;RW;Regular Toilet             General ADL Comments: Patient was able to demonstrate rolling to his side prior to sitting on EOB without VC.     Vision   Additional Comments: Pt reports that his vision is blurry. He received some eye drops before lunch and they make his eyes burn.          Cognition Arousal/Alertness: Awake/alert Behavior During Therapy: WFL for tasks assessed/performed Overall Cognitive Status: Within Functional Limits for tasks assessed                     Pertinent Vitals/ Pain       Pain Assessment: 0-10 Pain Score: 8  Pain Location: abdomen Pain Descriptors / Indicators: Constant;Sharp Pain Intervention(s): Limited activity within patient's tolerance;Monitored during session;Premedicated before session  Progress Toward Goals  OT Goals(current goals can now be found in the care plan section)  Progress towards OT goals: Goals met/education completed, patient discharged from [place] goals met and education completed, patient discharged from [place] OT "6 Clicks" Daily Activity     Outcome Measure   Help from another person eating meals?: None Help from another person taking care of personal grooming?: None Help from another person toileting, which includes using toliet, bedpan, or  urinal?: None Help from another person bathing (including washing, rinsing, drying)?: None Help from another person to put on and taking off regular upper body clothing?: None Help from another person to put on and taking off regular lower body clothing?: None 6 Click Score: 24    End of Session Equipment Utilized During Treatment: Rolling walker  OT Visit Diagnosis: Unsteadiness on feet (R26.81);Muscle weakness (generalized) (M62.81)   Activity Tolerance Patient tolerated treatment well   Patient Left in bed;with call bell/phone within reach   Nurse Communication          Time: 1346-1400 OT Time Calculation (min): 14 min  Charges: OT General Charges $OT Visit: 1 Visit OT Treatments $Self Care/Home Management : 8-22 mins(14[place], OTR/L,CBIS  [phone]    Vessie Olmsted, Clarene Duke 04/03/2019, 2:06 PM

## 2019-04-04 MED ORDER — MAGNESIUM HYDROXIDE 400 MG/5ML PO SUSP
30.0000 mL | Freq: Once | ORAL | Status: AC
  Administered 2019-04-04: 13:00:00 30 mL via ORAL
  Filled 2019-04-04: qty 30

## 2019-04-04 MED ORDER — POLYETHYLENE GLYCOL 3350 17 G PO PACK
17.0000 g | PACK | Freq: Two times a day (BID) | ORAL | Status: DC
  Administered 2019-04-04: 17 g via ORAL
  Filled 2019-04-04 (×3): qty 1

## 2019-04-04 MED ORDER — SORBITOL 70 % SOLN: 30.0000 mL | Freq: Every day | Status: DC | PRN

## 2019-04-04 NOTE — Progress Notes (Signed)
Pt refused HS dose of Miralax stating he is having soft BM's and doesn't want to get loose stools. Jessie Foot, RN

## 2019-04-04 NOTE — TOC Initial Note (Signed)
Transition of Care [facility]) - Initial/Assessment Note    Patient Details  Name: Tom Bender MRN: [id number] Date of Birth: 02-02-1961  Transition of Care [facility]) CM/SW Contact:    Eileen Stanford, LCSW Phone Number: 04/04/2019, 3:47 PM  Clinical Narrative:      Pt lives home with sister. At this time pt is doing too well physically for [place] to do a LOG. Pt aware. Pt agreeable to HHPT. Pt agreeable to [facility] (charity). CSW has provided referral to [facility] [place]. MD aware.             Expected Discharge Plan: [place] Barriers to Discharge: No Barriers Identified   Patient Goals and CMS Choice Patient states their goals for this hospitalization and ongoing recovery are:: " to get better" CMS Medicare.gov Compare Post Acute Care list provided to:: Patient Choice offered to / list presented to : Patient  Expected Discharge Plan and Services Expected Discharge Plan: [place] In-house Referral: NA   Post Acute Care Choice: [place] arrangements for the past 2 months: Single Family Home                           HH Arranged: PT, OT [place] Agency: [facility] [employer] Date [place]: 04/04/19 Time [place]: [place] Representative spoke with at [place]: [place] Arrangements/Services Living arrangements for the past 2 months: [place] Lives with:: Siblings Patient language and need for interpreter reviewed:: Yes Do you feel safe going back to the place where you live?: Yes      Need for Family Participation in Patient Care: Yes (Comment) Care giver support system in place?: Yes (comment)(lives with sister)   Criminal Activity/Legal Involvement Pertinent to Current Situation/Hospitalization: No - Comment as needed  Activities of Daily Living Home Assistive Devices/Equipment: None ADL Screening (condition at time of admission) Patient's cognitive ability  adequate to safely complete daily activities?: Yes Is the patient deaf or have difficulty hearing?: No Does the patient have difficulty seeing, even when wearing glasses/contacts?: No Does the patient have difficulty concentrating, remembering, or making decisions?: No Patient able to express need for assistance with ADLs?: No Does the patient have difficulty dressing or bathing?: No Independently performs ADLs?: Yes (appropriate for developmental age) Does the patient have difficulty walking or climbing stairs?: Yes Weakness of Legs: Both Weakness of Arms/Hands: Both  Permission Sought/Granted Permission sought to share information with : Family Supports Permission granted to share information with : Yes, Verbal Permission Granted  Share Information with NAME: [name]  Permission granted to share info w AGENCY: [facility]  Permission granted to share info w Relationship: sister  Permission granted to share info w Contact Information: lost phone  Emotional Assessment Appearance:: Appears stated age Attitude/Demeanor/Rapport: (pt was appropriate) Affect (typically observed): Accepting Orientation: : Oriented to Self, Oriented to Place, Oriented to  Time, Oriented to Situation Alcohol / Substance Use: Not Applicable Psych Involvement: No (comment)  Admission diagnosis:  Lactic acidosis [E87.2] Precordial chest pain [R07.2] Generalized abdominal pain [R10.84] Flu-like symptoms [R68.89] Patient Active Problem List   Diagnosis Date Noted  . SIRS (systemic inflammatory response syndrome) ([place]) 03/23/2019  . Hyponatremia 03/23/2019  . Hypokalemia 03/23/2019  . Benign essential HTN 03/23/2019  . Alcohol abuse 03/23/2019  . Lactic acidosis 03/23/2019  . Pancreatic cyst 03/23/2019   PCP:  Patient, No  Pcp Per Pharmacy:   [facility] [address], [place] - [place] [address] Phone: [phone] Fax: [phone]  [facility]  [place], [place] - [address] [place] [address] Phone: [phone] Fax: [phone]     Social Determinants of Health (SDOH) Interventions    Readmission Risk Interventions No flowsheet data found.

## 2019-04-04 NOTE — Discharge Summary (Addendum)
Physician Discharge Summary  Tom BenneBanks Futrell UJW:[id number] DOB: Jan 18, 1961 DOA: 03/23/2019  PCP: Patient, No Pcp Per  Admit date: 03/23/2019 Discharge date: 04/04/2019  Admitted From: Home Disposition: Home  Recommendations for Outpatient Follow-up:  1. Follow up with general surgery in one week 2. Follow-up with ophthalmology 3. Please obtain BMP/CBC in one week 4. Please follow up on the following pending results: None  Home Health: PT Equipment/Devices: None  Discharge Condition: Stable CODE STATUS: Full code Diet recommendation: Regular diet   Brief/Interim Summary:  Admission HPI written by Rometta EmeryMohammad L Garba, MD   Chief Complaint: Generalized weakness and body aches  HPI: Tom Bender is a 58 y.o. male with medical history significant of alcohol abuse, tobacco abuse, chronic kidney disease stage II, hypertension, presenting with progressive weakness body aches headaches chest pain and shortness of breath over the last 1 to 2 weeks.  Patient also reported some dizziness and falls.  Also abdominal pain in the epigastric region which is rated as 8 out of 10.  It starts from the epigastric region and goes downwards towards the umbilicus.  No association with meals.  Symptoms have been progressively getting worse.  Patient abuses alcohol and has had issues with alcoholism.  He was seen at [facility] where he was evaluated.  Afebrile but elevated LFTs.  CT abdomen pelvis which indicating possible pancreatic cyst.  Also patient having significant shortness of breath he is a smoker but no history of COPD.  He was tachypneic.  Patient being admitted to the hospital with SIRS.  Empiric antibiotics started..  ED Course: Temperature is 98.5 blood pressure 133/95 pulse 118 respirate of 28 oxygen sat 99% on room air.  Sodium 133 potassium 3.4 chloride 88 CO2 33 with glucose 126.  Alkaline phosphatase 166 albumin 3.1 AST 170 ALT 59 total bilirubin 3.8.  Lactic acid 4.7.  CBC within  normal except for platelets 137.  Chest x-ray showed no active disease.  CT chest and abdomen and pelvis showed 1.7 x 0.8 cm cystic area in the tail of pancreas.  Sludge in gallbladder.  No other significant findings.  Hepatic steatosis also noted.  [facility]    Hospital course:  Sepsis Bacteremia source. Present on admission.  Physiology improved.  Coagulase negative MR staph bacteremia Unknown source. 2/4 bottles. Repeat blood cultures (6/7) no growth to date. Completed antibiotic course.  Upper abdominal pain Concern for possible gallbladder etiology vs gastritis. HIDA scan obtained and was negative. MRCP significant for gallbladder wall thickening. GI consulted and patient underwent EGD. General surgery consulted for cholecystectomy which occurred on 6/11. Sharp pain that radiated to back has resolved since surgery. Abdominal pain after surgery continues. X-ray significant for stool burden -Miralax -Milk of mag PO  Chest tightness Unsure of etiology. EKG unremarkable, troponin negative, D-dimer appears stable and previous CTA chest negative for a PE. Possibly related to some reflux. Can use OTC PPI on discharge.  Conjunctivitis Likely viral but unsure. Significant symptoms of burning. Persistent with erythromycin. Discussed with Dr. Sherrine MaplesGlenn, ophthalmology who recommended switching to ciprofloxacin. Some improvement prior to discharge. Will send with a prescription on discharge to complete 5 days.  Rash Scrotal. Appears allergic. Improving. Can continue benadryl and keeping the area dry. Hydrocortisone cream was prescribed but not used. Can be picked up OTC if desired.  Weakness Generalized. Improved. Orthostatic vitals negative. Recommended for SNF, but unable to place.  Hypokalemia Hypomagnesemia Replete as needed.  Essential hypertension Well  controlled. Not on medications as an outpatient.  Alcohol abuse Alcohol  withdrawal Withdrawal symptoms resolved.  Hyponatremia Resolved with IV fluids.  Dehydration Resolved with IV fluids.  Pancreatic tail lesion Favored to be a pseudocyst but indolent cystic neoplasm cannot be ruled out recommendations for pre and post-contrast MRI/MRCP in 1 year.  Discharge Diagnoses:  Principal Problem:   SIRS (systemic inflammatory response syndrome) (HCC) Active Problems:   Hyponatremia   Hypokalemia   Benign essential HTN   Alcohol abuse   Lactic acidosis   Pancreatic cyst    Discharge Instructions   Allergies as of 04/05/2019   No Known Allergies     Medication List    TAKE these medications   ciprofloxacin 0.3 % ophthalmic solution Commonly known as: CILOXAN Place 1 drop into both eyes 4 (four) times daily for 2 days.   oxyCODONE 5 MG immediate release tablet Commonly known as: Oxy IR/ROXICODONE Take 1 tablet (5 mg total) by mouth every 4 (four) hours as needed for breakthrough pain.   pantoprazole 40 MG tablet Commonly known as: PROTONIX Take 1 tablet (40 mg total) by mouth daily. Start taking on: April 06, 2019   polyethylene glycol 17 g packet Commonly known as: MIRALAX / GLYCOLAX Take 17 g by mouth daily as needed.      Follow-up Information    [facility] [place] Follow up on 04/12/2019.   Specialty: General Surgery Why: 06/25 at 3 pm. You will receive a call at the time of your appointment. Please email a photo of your incisions to [email or web address] a few days before your appointment  Contact information: [place] [address] [phone]        Lonia Skinner, MD. Schedule an appointment as soon as possible for a visit.   Specialty: Ophthalmology Why: Conjunctivitis Contact information: [place] [address] [phone]          No Known Allergies  Consultations:  GI  General surgery  Ophthalmology   Procedures/Studies: Dg  Chest 2 View  Result Date: 03/31/2019 CLINICAL DATA:  Anterior chest tightness for a few days. EXAM: CHEST - 2 VIEW COMPARISON:  None. FINDINGS: The heart size and mediastinal contours are within normal limits. Both lungs are clear. The visualized skeletal structures are unremarkable. IMPRESSION: No active cardiopulmonary disease. Electronically Signed   By: Dorise Bullion III M.D   On: 03/31/2019 13:36   Dg Lumbar Spine 2-3 Views  Result Date: 03/26/2019 CLINICAL DATA:  Low back pain EXAM: LUMBAR SPINE - 2-3 VIEW COMPARISON:  03/23/2019 FINDINGS: Five lumbar type vertebral bodies are well visualized. Vertebral body height is well maintained. Mild osteophytic changes are noted similar to that seen on prior CT examination. No soft tissue abnormality is noted. IMPRESSION: Mild degenerative change without acute abnormality. Electronically Signed   By: Inez Catalina M.D.   On: 03/26/2019 15:44   Dg Cholangiogram Operative  Result Date: 03/29/2019 CLINICAL DATA:  58 year old male undergoing laparoscopic cholecystectomy EXAM: INTRAOPERATIVE CHOLANGIOGRAM TECHNIQUE: Cholangiographic images from the C-arm fluoroscopic device were submitted for interpretation post-operatively. Please see the procedural report for the amount of contrast and the fluoroscopy time utilized. COMPARISON:  Nuclear medicine HIDA scan 03/27/2019 FINDINGS: A single cine clip is submitted for review. The images demonstrate cannulation of the cystic duct remanent and opacification of the biliary tree. No evidence of biliary stenosis, stricture, choledocholithiasis or dilatation. The ampulla is patent with contrast material flowing freely into the duodenum. IMPRESSION:  Negative intraoperative cholangiogram. Electronically Signed   By: Malachy MoanHeath  McCullough M.D.   On: 03/29/2019 11:03   Ct Angio Chest Pe W And/or Wo Contrast  Result Date: 03/23/2019 CLINICAL DATA:  Chest pain and cough. Elevated D-dimer. Abdominal pain with elevated lipase EXAM:  CT ANGIOGRAPHY CHEST CT ABDOMEN AND PELVIS WITH CONTRAST TECHNIQUE: Multidetector CT imaging of the chest was performed using the standard protocol during bolus administration of intravenous contrast. Multiplanar CT image reconstructions and MIPs were obtained to evaluate the vascular anatomy. Multidetector CT imaging of the abdomen and pelvis was performed using the standard protocol during bolus administration of intravenous contrast. CONTRAST:  100mL OMNIPAQUE IOHEXOL 350 MG/ML SOLN COMPARISON:  Chest radiograph March 23, 2019 FINDINGS: CTA CHEST FINDINGS Cardiovascular: There is no demonstrable pulmonary embolus. There is no thoracic aortic aneurysm or dissection. There is tortuosity in the descending thoracic aorta. The visualized great vessels appear unremarkable. There is no pericardial effusion or pericardial thickening. Mediastinum/Nodes: Thyroid appears normal. There are subcentimeter mediastinal lymph nodes. There is no adenopathy by size criteria in the thorax. No esophageal lesions are demonstrable. Lungs/Pleura: There is underlying centrilobular emphysematous change. No edema or consolidation. No pleural effusion or pleural thickening evident. Musculoskeletal: There are no blastic or lytic bone lesions. There is degenerative change in the thoracic spine. There are no evident chest wall lesions. Review of the MIP images confirms the above findings. CT ABDOMEN and PELVIS FINDINGS Hepatobiliary: There is hepatic steatosis. No focal liver lesions are appreciable. There is apparent sludge in the gallbladder. There may be superimposed small gallstones within the gallbladder. The gallbladder wall does not appear appreciably thickened. There is no biliary duct dilatation. Pancreas: In the tail of the pancreas, there is a small cystic area containing a focus of calcification measuring 1.7 x 0.8 cm. Elsewhere, there is no pancreatic mass or pancreatic duct dilatation. There is no peripancreatic fluid or pancreatic  enlargement. Pancreatic enhancement is homogeneous throughout. Spleen: No splenic lesions are evident. Adrenals/Urinary Tract: Adrenals bilaterally appear normal. Kidneys bilaterally show no evident mass or hydronephrosis on either side. There is no appreciable renal or ureteral calculus on either side. Urinary bladder is midline with wall thickness within normal limits. Stomach/Bowel: There is no appreciable bowel wall or mesenteric thickening. No evident bowel obstruction. Terminal ileum appears unremarkable. There is no appreciable free air or portal venous air. Vascular/Lymphatic: There is no abdominal aortic aneurysm. There is aortic and iliac artery atherosclerosis. There is no evident adenopathy in the abdomen or pelvis. Reproductive: There are a few tiny prostatic calculi. Prostate and seminal vesicles are normal in size and contour. There is no evident pelvic mass. Other: Appendix appears normal. There is no appreciable abscess or ascites in the abdomen or pelvis. Musculoskeletal: There is degenerative change in the lumbar spine. There are no blastic or lytic bone lesions. No abdominal wall lesions evident. Review of the MIP images confirms the above findings. IMPRESSION: CT angiogram chest: 1. No demonstrable pulmonary embolus. No thoracic aortic aneurysm or dissection. 2. Underlying centrilobular emphysematous change. No edema or consolidation. 3.  No demonstrable thoracic adenopathy. CT abdomen and pelvis: 1. There is a 1.7 x 0.8 cm cystic area in the tail of the pancreas containing a focus of calcification. Questions small cystic neoplasm versus residua of inflammation. Pancreas otherwise appears normal. That this lesion may warrant nonemergent pancreatic MR pre and post-contrast to further evaluate. 2. Sludge in gallbladder. There may be small intermingled gallstones. 3. No evident bowel obstruction. No abscess in the  abdomen or pelvis. Appendix appears normal. 4. No evident renal or ureteral calculi.  No hydronephrosis. Urinary bladder wall thickness normal. 5.  There is hepatic steatosis. 6.  There is aortoiliac atherosclerosis. Electronically Signed   By: Bretta BangWilliam  Woodruff III M.D.   On: 03/23/2019 18:57   Mr Abdomen W Wo Contrast  Result Date: 03/24/2019 CLINICAL DATA:  Pancreatic tail mass on abdominal CT EXAM: MRI ABDOMEN WITHOUT AND WITH CONTRAST TECHNIQUE: Multiplanar multisequence MR imaging of the abdomen was performed both before and after the administration of intravenous contrast. CONTRAST:  8 cc Gadavist COMPARISON:  03/23/2019 CT FINDINGS: Lower chest: Normal heart size without pericardial or pleural effusion. Hepatobiliary: Moderate hepatic steatosis. Gallstones on the order of 6 mm. Possible mild gallbladder wall thickening at 5 mm on 28/8. No pericholecystic fluid. No biliary duct dilatation. Pancreas: Corresponding to the CT abnormality, within the pancreatic tail, is a 1.4 cm T2 hyperintense, T1 hypointense lesion without significant postcontrast enhancement. Calcification, as evidenced by increased signal on long TE weighted imaging. No main duct dilatation or communication with the main duct. No peripancreatic edema. Spleen:  Normal in size, without focal abnormality. Adrenals/Urinary Tract: Normal adrenal glands. Normal kidneys, without hydronephrosis. Stomach/Bowel: Normal stomach and abdominal bowel loops. Vascular/Lymphatic: Normal caliber of the aorta and branch vessels. No retroperitoneal or retrocrural adenopathy. Other:  No ascites. Musculoskeletal: No acute osseous abnormality. IMPRESSION: 1. Cystic lesion within the pancreatic tail, favored to represent a pseudocyst. Indolent cystic neoplasm could look similar. Per consensus criteria, this warrants follow-up with pre and post-contrast MRI/MRCP at 1 year. This recommendation follows ACR consensus guidelines: Management of Incidental Pancreatic Cysts: A White Paper of the ACR Incidental Findings Committee. J Am Coll Radiol  2017;14:911-923. 2. Cholelithiasis with suspicion of mild gallbladder wall thickening. No specific evidence of acute cholecystitis. 3. Hepatic steatosis. Electronically Signed   By: Jeronimo GreavesKyle  Talbot M.D.   On: 03/24/2019 12:00   Ct Abdomen Pelvis W Contrast  Result Date: 03/23/2019 : CT angiogram chest as well as CT abdomen and pelvis reports are combined into a single dictation. Electronically Signed   By: Bretta BangWilliam  Woodruff III M.D.   On: 03/23/2019 19:01   Mr 3d Recon At Scanner  Result Date: 03/24/2019 CLINICAL DATA:  Pancreatic tail mass on abdominal CT EXAM: MRI ABDOMEN WITHOUT AND WITH CONTRAST TECHNIQUE: Multiplanar multisequence MR imaging of the abdomen was performed both before and after the administration of intravenous contrast. CONTRAST:  8 cc Gadavist COMPARISON:  03/23/2019 CT FINDINGS: Lower chest: Normal heart size without pericardial or pleural effusion. Hepatobiliary: Moderate hepatic steatosis. Gallstones on the order of 6 mm. Possible mild gallbladder wall thickening at 5 mm on 28/8. No pericholecystic fluid. No biliary duct dilatation. Pancreas: Corresponding to the CT abnormality, within the pancreatic tail, is a 1.4 cm T2 hyperintense, T1 hypointense lesion without significant postcontrast enhancement. Calcification, as evidenced by increased signal on long TE weighted imaging. No main duct dilatation or communication with the main duct. No peripancreatic edema. Spleen:  Normal in size, without focal abnormality. Adrenals/Urinary Tract: Normal adrenal glands. Normal kidneys, without hydronephrosis. Stomach/Bowel: Normal stomach and abdominal bowel loops. Vascular/Lymphatic: Normal caliber of the aorta and branch vessels. No retroperitoneal or retrocrural adenopathy. Other:  No ascites. Musculoskeletal: No acute osseous abnormality. IMPRESSION: 1. Cystic lesion within the pancreatic tail, favored to represent a pseudocyst. Indolent cystic neoplasm could look similar. Per consensus criteria,  this warrants follow-up with pre and post-contrast MRI/MRCP at 1 year. This recommendation follows ACR consensus guidelines: Management  of Incidental Pancreatic Cysts: A White Paper of the ACR Incidental Findings Committee. J Am Coll Radiol 2017;14:911-923. 2. Cholelithiasis with suspicion of mild gallbladder wall thickening. No specific evidence of acute cholecystitis. 3. Hepatic steatosis. Electronically Signed   By: Jeronimo Greaves M.D.   On: 03/24/2019 12:00   Nm Hepato W/eject Fract  Result Date: 03/27/2019 CLINICAL DATA:  Epigastric pain EXAM: NUCLEAR MEDICINE HEPATOBILIARY IMAGING WITH GALLBLADDER EF TECHNIQUE: Sequential images of the abdomen were obtained out to 60 minutes following intravenous administration of radiopharmaceutical. After oral ingestion of Ensure, gallbladder ejection fraction was determined. At 60 min, normal ejection fraction is greater than 33%. RADIOPHARMACEUTICALS:  5.2 mCi Tc-42m  Choletec IV COMPARISON:  None. FINDINGS: Prompt uptake and biliary excretion of activity by the liver is seen. Gallbladder activity is visualized, consistent with patency of cystic duct. Biliary activity passes into small bowel, consistent with patent common bile duct. Calculated gallbladder ejection fraction is 92%. (Normal gallbladder ejection fraction with Ensure is greater than 33%.) IMPRESSION: Normal uptake and excretion of biliary tracer. Electronically Signed   By: Alcide Clever M.D.   On: 03/27/2019 10:17   Dg Chest Portable 1 View  Result Date: 03/23/2019 CLINICAL DATA:  Generalized body aches. Headache. Chest pain. Shortness of breath. Cough. EXAM: PORTABLE CHEST 1 VIEW COMPARISON:  No prior. FINDINGS: Mediastinum and hilar structures normal. Lungs are clear. No pleural effusion or pneumothorax. Heart size normal. Degenerative change thoracic spine. IMPRESSION: No acute cardiopulmonary disease. Electronically Signed   By: Maisie Fus  Register   On: 03/23/2019 16:25   Dg Abd Portable 1v  Result  Date: 04/03/2019 CLINICAL DATA:  Abdominal pain and tenderness, post cholecystectomy last Thursday EXAM: PORTABLE ABDOMEN - 1 VIEW COMPARISON:  CT abdomen and pelvis 03/23/2019 FINDINGS: Surgical clips RIGHT upper quadrant from cholecystectomy. Nonobstructive bowel gas pattern. Increased stool throughout colon. No bowel dilatation or bowel wall thickening. Bones demineralized. IMPRESSION: Increased stool throughout colon. Electronically Signed   By: Ulyses Southward M.D.   On: 04/03/2019 11:33      Subjective: Still some eye discharge. Associated blurry vision  Discharge Exam: Vitals:   04/04/19 0504 04/04/19 1439  BP: 108/79 104/81  Pulse: 74 81  Resp:  20  Temp: 98.7 F (37.1 C) 97.9 F (36.6 C)  SpO2: 94% 95%   Vitals:   04/03/19 1502 04/03/19 2049 04/04/19 0504 04/04/19 1439  BP: 104/75 109/75 108/79 104/81  Pulse: 80 69 74 81  Resp:    20  Temp: 98.3 F (36.8 C) 98.4 F (36.9 C) 98.7 F (37.1 C) 97.9 F (36.6 C)  TempSrc: Oral Oral Oral Oral  SpO2: 97% 96% 94% 95%  Weight:   78.9 kg   Height:        General: Pt is alert, awake, not in acute distress Cardiovascular: RRR, S1/S2 +, no rubs, no gallops Respiratory: CTA bilaterally, no wheezing, no rhonchi Abdominal: Soft, mildly tender, ND, bowel sounds + Extremities: no edema, no cyanosis    The results of significant diagnostics from this hospitalization (including imaging, microbiology, ancillary and laboratory) are listed below for reference.     Microbiology: Recent Results (from the past 240 hour(s))  Surgical pcr screen     Status: None   Collection Time: 03/29/19  1:47 PM   Specimen: Nasal Mucosa; Nasal Swab  Result Value Ref Range Status   MRSA, PCR NEGATIVE NEGATIVE Final   Staphylococcus aureus NEGATIVE NEGATIVE Final    Comment: (NOTE) The Xpert SA Assay (FDA approved for NASAL  specimens in patients 85 years of age and older), is one component of a comprehensive surveillance program. It is not intended  to diagnose infection nor to guide or monitor treatment. Performed at [facility], [address] [place], [place] [address]      Labs: BNP (last 3 results) No results for input(s): BNP in the last 8760 hours. Basic Metabolic Panel: Recent Labs  Lab 03/30/19 0758 04/02/19 0452 04/03/19 0956  NA 137 137 136  K 4.0 4.0 3.9  CL 104 104 103  CO2 GLUCOSE 88 111* 106*  BUN 5* <5* <5*  CREATININE 0.73 0.69 0.86  CALCIUM 8.7* 8.4* 8.7*   Liver Function Tests: Recent Labs  Lab 04/03/19 0956  AST 93*  ALT 45*  ALKPHOS 197*  BILITOT 0.9  PROT 6.5  ALBUMIN 2.5*   No results for input(s): LIPASE, AMYLASE in the last 168 hours. No results for input(s): AMMONIA in the last 168 hours. CBC: Recent Labs  Lab 03/30/19 0758  WBC 7.0  HGB 13.0  HCT 39.3  MCV 111.6*  PLT 147*   Cardiac Enzymes: Recent Labs  Lab 03/31/19 1650  TROPONINI <0.03   BNP: Invalid input(s): POCBNP CBG: No results for input(s): GLUCAP in the last 168 hours. D-Dimer No results for input(s): DDIMER in the last 72 hours. Hgb A1c No results for input(s): HGBA1C in the last 72 hours. Lipid Profile No results for input(s): CHOL, HDL, LDLCALC, TRIG, CHOLHDL, LDLDIRECT in the last 72 hours. Thyroid function studies No results for input(s): TSH, T4TOTAL, T3FREE, THYROIDAB in the last 72 hours.  Invalid input(s): FREET3 Anemia work up No results for input(s): VITAMINB12, FOLATE, FERRITIN, TIBC, IRON, RETICCTPCT in the last 72 hours. Urinalysis    Component Value Date/Time   COLORURINE YELLOW 03/23/2019 1606   APPEARANCEUR CLEAR 03/23/2019 1606   LABSPEC 1.010 03/23/2019 1606   PHURINE 8.5 (H) 03/23/2019 1606   GLUCOSEU NEGATIVE 03/23/2019 1606   HGBUR NEGATIVE 03/23/2019 1606   BILIRUBINUR NEGATIVE 03/23/2019 1606   KETONESUR NEGATIVE 03/23/2019 1606   PROTEINUR NEGATIVE 03/23/2019 1606   NITRITE NEGATIVE 03/23/2019 1606   LEUKOCYTESUR NEGATIVE 03/23/2019 1606   Sepsis  Labs Invalid input(s): PROCALCITONIN,  WBC,  LACTICIDVEN Microbiology Recent Results (from the past 240 hour(s))  Surgical pcr screen     Status: None   Collection Time: 03/29/19  1:47 PM   Specimen: Nasal Mucosa; Nasal Swab  Result Value Ref Range Status   MRSA, PCR NEGATIVE NEGATIVE Final   Staphylococcus aureus NEGATIVE NEGATIVE Final    Comment: (NOTE) The Xpert SA Assay (FDA approved for NASAL specimens in patients 16 years of age and older), is one component of a comprehensive surveillance program. It is not intended to diagnose infection nor to guide or monitor treatment. Performed at [facility], [address] [place], [place] [address]      Time coordinating discharge: 35 minutes spent  SIGNED:   Jacquelin Hawking, MD [facility] 04/04/2019, 4:00 PM

## 2019-04-04 NOTE — Discharge Instructions (Signed)
Tom Bender,  You were in the hospital with a mild blood infection which was completely treated. You also had some abdominal pain secondary to gallbladder issues, for which you had surgery. For your eye infection, you will be discharged on eye drops in addition to information for ophthalmology follow-up.  [facility]  Please arrive at least 30 min before your appointment to complete your check in paperwork.  If you are unable to arrive 30 min prior to your appointment time we may have to cancel or reschedule you. LAPAROSCOPIC SURGERY: POST OP INSTRUCTIONS Always review your discharge instruction sheet given to you by the facility where your surgery was performed. IF YOU HAVE DISABILITY OR FAMILY LEAVE FORMS, YOU MUST BRING THEM TO THE OFFICE FOR PROCESSING.   DO NOT GIVE THEM TO YOUR DOCTOR.  PAIN CONTROL  1. First take acetaminophen (Tylenol) AND/or ibuprofen (Advil) to control your pain after surgery.  Follow directions on package.  Taking acetaminophen (Tylenol) and/or ibuprofen (Advil) regularly after surgery will help to control your pain and lower the amount of prescription pain medication you may need.  You should not take more than 4,000 mg (4 grams) of acetaminophen (Tylenol) in 24 hours.  You should not take ibuprofen (Advil), aleve, motrin, naprosyn or other NSAIDS if you have a history of stomach ulcers or chronic kidney disease.  2. A prescription for pain medication may be given to you upon discharge.  Take your pain medication as prescribed, if you still have uncontrolled pain after taking acetaminophen (Tylenol) or ibuprofen (Advil). 3. Use ice packs to help control pain. 4. If you need a refill on your pain medication, please contact your pharmacy.  They will contact our office to request authorization. Prescriptions will not be filled after 5pm or on week-ends.  HOME MEDICATIONS 5. Take your usually prescribed medications unless otherwise  directed.  DIET 6. You should follow a light diet the first few days after arrival home.  Be sure to include lots of fluids daily. Avoid fatty, fried foods.   CONSTIPATION 7. It is common to experience some constipation after surgery and if you are taking pain medication.  Increasing fluid intake and taking a stool softener (such as Colace) will usually help or prevent this problem from occurring.  A mild laxative (Milk of Magnesia or Miralax) should be taken according to package instructions if there are no bowel movements after 48 hours.  WOUND/INCISION CARE 8. Most patients will experience some swelling and bruising in the area of the incisions.  Ice packs will help.  Swelling and bruising can take several days to resolve.  9. Unless discharge instructions indicate otherwise, follow guidelines below  a. STERI-STRIPS - you may remove your outer bandages 48 hours after surgery, and you may shower at that time.  You have steri-strips (small skin tapes) in place directly over the incision.  These strips should be left on the skin for 7-10 days.   b. DERMABOND/SKIN GLUE - you may shower in 24 hours.  The glue will flake off over the next 2-3 weeks. 10. Any sutures or staples will be removed at the office during your follow-up visit.  ACTIVITIES 11. You may resume regular (light) daily activities beginning the next day--such as daily self-care, walking, climbing stairs--gradually increasing activities as tolerated.  You may have sexual intercourse when it is comfortable.  Refrain from any heavy lifting or straining until approved by your doctor. a. You may drive when you are no longer  taking prescription pain medication, you can comfortably wear a seatbelt, and you can safely maneuver your car and apply brakes.  FOLLOW-UP 12. You should see your doctor in the office for a follow-up appointment approximately 2-3 weeks after your surgery.  You should have been given your post-op/follow-up appointment  when your surgery was scheduled.  If you did not receive a post-op/follow-up appointment, make sure that you call for this appointment within a day or two after you arrive home to insure a convenient appointment time.   WHEN TO CALL YOUR DOCTOR: 1. Fever over 101.0 2. Inability to urinate 3. Continued bleeding from incision. 4. Increased pain, redness, or drainage from the incision. 5. Increasing abdominal pain  The clinic staff is available to answer your questions during regular business hours.  Please dont hesitate to call and ask to speak to one of the nurses for clinical concerns.  If you have a medical emergency, go to the nearest emergency room or call 911.  A surgeon from [facility] is always on call at the hospital. [address], [address], [place], [place]  [address] ? [address] [address], [place], [place]   [id number] [phone] ? [phone] ? FAX [phone]    Managing Your Pain After Surgery Without Opioids    Thank you for participating in our program to help patients manage their pain after surgery without opioids. This is part of our effort to provide you with the best care possible, without exposing you or your family to the risk that opioids pose.  What pain can I expect after surgery? You can expect to have some pain after surgery. This is normal. The pain is typically worse the day after surgery, and quickly begins to get better. Many studies have found that many patients are able to manage their pain after surgery with Over-the-Counter (OTC) medications such as Tylenol and Motrin. If you have a condition that does not allow you to take Tylenol or Motrin, notify your surgical team.  How will I manage my pain? The best strategy for controlling your pain after surgery is around the clock pain control with Tylenol (acetaminophen) and Motrin (ibuprofen or Advil). Alternating these medications with each other allows you to maximize your pain  control. In addition to Tylenol and Motrin, you can use heating pads or ice packs on your incisions to help reduce your pain.  How will I alternate your regular strength over-the-counter pain medication? You will take a dose of pain medication every three hours. ; Start by taking 650 mg of Tylenol (2 pills of 325 mg) ; 3 hours later take 600 mg of Motrin (3 pills of 200 mg) ; 3 hours after taking the Motrin take 650 mg of Tylenol ; 3 hours after that take 600 mg of Motrin.   - 1 -  See example - if your first dose of Tylenol is at 12:00 PM   12:00 PM Tylenol 650 mg (2 pills of 325 mg)  3:00 PM Motrin 600 mg (3 pills of 200 mg)  6:00 PM Tylenol 650 mg (2 pills of 325 mg)  9:00 PM Motrin 600 mg (3 pills of 200 mg)  Continue alternating every 3 hours   We recommend that you follow this schedule around-the-clock for at least 3 days after surgery, or until you feel that it is no longer needed. Use the table on the last page of this handout to keep track of the medications you are taking. Important: Do not  take more than 3000mg  of Tylenol or 3200mg  of Motrin in a 24-hour period. Do not take ibuprofen/Motrin if you have a history of bleeding stomach ulcers, severe kidney disease, &/or actively taking a blood thinner  What if I still have pain? If you have pain that is not controlled with the over-the-counter pain medications (Tylenol and Motrin or Advil) you might have what we call breakthrough pain. You will receive a prescription for a small amount of an opioid pain medication such as Oxycodone, Tramadol, or Tylenol with Codeine. Use these opioid pills in the first 24 hours after surgery if you have breakthrough pain. Do not take more than 1 pill every 4-6 hours.  If you still have uncontrolled pain after using all opioid pills, don't hesitate to call our staff using the number provided. We will help make sure you are managing your pain in the best way possible, and if necessary, we can  provide a prescription for additional pain medication.   Day 1    Time  Name of Medication Number of pills taken  Amount of Acetaminophen  Pain Level   Comments  AM PM       AM PM       AM PM       AM PM       AM PM       AM PM       AM PM       AM PM       Total Daily amount of Acetaminophen Do not take more than  3,000 mg per day      Day 2    Time  Name of Medication Number of pills taken  Amount of Acetaminophen  Pain Level   Comments  AM PM       AM PM       AM PM       AM PM       AM PM       AM PM       AM PM       AM PM       Total Daily amount of Acetaminophen Do not take more than  3,000 mg per day      Day 3    Time  Name of Medication Number of pills taken  Amount of Acetaminophen  Pain Level   Comments  AM PM       AM PM       AM PM       AM PM          AM PM       AM PM       AM PM       AM PM       Total Daily amount of Acetaminophen Do not take more than  3,000 mg per day      Day 4    Time  Name of Medication Number of pills taken  Amount of Acetaminophen  Pain Level   Comments  AM PM       AM PM       AM PM       AM PM       AM PM       AM PM       AM PM       AM PM       Total Daily amount of Acetaminophen Do not take more than  3,000  mg per day      Day 5    Time  Name of Medication Number of pills taken  Amount of Acetaminophen  Pain Level   Comments  AM PM       AM PM       AM PM       AM PM       AM PM       AM PM       AM PM       AM PM       Total Daily amount of Acetaminophen Do not take more than  3,000 mg per day       Day 6    Time  Name of Medication Number of pills taken  Amount of Acetaminophen  Pain Level  Comments  AM PM       AM PM       AM PM       AM PM       AM PM       AM PM       AM PM       AM PM       Total Daily amount of Acetaminophen Do not take more than  3,000 mg per day      Day 7    Time  Name of Medication Number of pills taken  Amount  of Acetaminophen  Pain Level   Comments  AM PM       AM PM       AM PM       AM PM       AM PM       AM PM       AM PM       AM PM       Total Daily amount of Acetaminophen Do not take more than  3,000 mg per day        For additional information about how and where to safely dispose of unused opioid medications - [employer]  Disclaimer: This document contains information and/or instructional materials adapted from [place] for the typical patient with your condition. It does not replace medical advice from your health care provider because your experience may differ from that of the typical patient. Talk to your health care provider if you have any questions about this document, your condition or your treatment plan. Adapted from [place] If you have a gallbladder condition, you may have trouble digesting fats. Eating a low-fat diet can help reduce your symptoms, and may be helpful before and after having surgery to remove your gallbladder (cholecystectomy). Your health care provider may recommend that you work with a diet and nutrition specialist (dietitian) to help you reduce the amount of fat in your diet. What are tips for following this plan? General guidelines  Limit your fat intake to less than 30% of your total daily calories. If you eat around 1,800 calories each day, this is less than 60 grams (g) of fat per day.  Fat is an important part of a healthy diet. Eating a low-fat diet can make it hard to maintain a healthy body weight. Ask your dietitian how much fat, calories, and other nutrients you need each day.  Eat small, frequent meals throughout the day instead of three large meals.  Drink at least 8-10 cups of fluid a day. Drink enough fluid to keep your urine clear or pale yellow.  Limit alcohol intake to no more than 1 drink a day for nonpregnant women and 2 drinks a day for men. One drink equals 12  oz of beer, 5 oz of wine, or 1 oz of hard liquor. Reading food labels  Check Nutrition Facts on food labels for the amount of fat per serving. Choose foods with less than 3 grams of fat per serving. Shopping  Choose nonfat and low-fat healthy foods. Look for the words nonfat, low fat, or fat free.  Avoid buying processed or prepackaged foods. Cooking  Cook using low-fat methods, such as baking, broiling, grilling, or boiling.  Cook with small amounts of healthy fats, such as olive oil, grapeseed oil, canola oil, or sunflower oil. What foods are recommended?   All fresh, frozen, or canned fruits and vegetables.  Whole grains.  Low-fat or non-fat (skim) milk and yogurt.  Lean meat, skinless poultry, fish, eggs, and beans.  Low-fat protein supplement powders or drinks.  Spices and herbs. What foods are not recommended?  High-fat foods. These include baked goods, fast food, fatty cuts of meat, ice cream, french toast, sweet rolls, pizza, cheese bread, foods covered with butter, creamy sauces, or cheese.  Fried foods. These include french fries, tempura, battered fish, breaded chicken, fried breads, and sweets.  Foods with strong odors.  Foods that cause bloating and gas. Summary  A low-fat diet can be helpful if you have a gallbladder condition, or before and after gallbladder surgery.  Limit your fat intake to less than 30% of your total daily calories. This is about 60 g of fat if you eat 1,800 calories each day.  Eat small, frequent meals throughout the day instead of three large meals. This information is not intended to replace advice given to you by your health care provider. Make sure you discuss any questions you have with your health care provider. Document Released: 10/09/2013 Document Revised: 11/11/2016 Document Reviewed: 11/11/2016 Elsevier Interactive Patient Education  2019 [employer]

## 2019-04-04 NOTE — Progress Notes (Signed)
PROGRESS NOTE    Tom Bender  [id number] DOB: 06-12-1961 DOA: 03/23/2019 PCP: Patient, No Pcp Per   Brief Narrative: Tom Bender is a 58 y.o. male with a history of alcohol and tobacco abuse, CKD stage II, hypertension. He presented secondary to weakness and aches and found to meet sepsis criteria with evidence of bacteremia. Abdominal pain is also present with concern for possible acute cholecystitis vs gastritis. GI on board. On IV Vancomycin. Underwent cholecystectomy for possible cholecystitis.   Assessment & Plan:   Principal Problem:   SIRS (systemic inflammatory response syndrome) (HCC) Active Problems:   Hyponatremia   Hypokalemia   Benign essential HTN   Alcohol abuse   Lactic acidosis   Pancreatic cyst   Sepsis Bacteremia source. Present on admission.  Physiology improved.  Coagulase negative MR staph bacteremia Unknown source. 2/4 bottles. Repeat blood cultures (6/7) no growth to date. Completed antibiotic course.  Upper abdominal pain Concern for possible gallbladder etiology vs gastritis. HIDA scan obtained and was negative. MRCP significant for gallbladder wall thickening. GI consulted and patient underwent EGD. General surgery consulted for cholecystectomy which occurred on 6/11. Sharp pain that radiated to back has resolved since surgery. Abdominal pain after surgery continues. X-ray significant for stool burden -Miralax -Milk of mag PO  Chest tightness Unsure of etiology. EKG unremarkable, troponin negative, D-dimer appears stable and previous CTA chest negative for a PE. Possibly related to some reflux -Continue PPI -Continue Maalox  Conjunctivitis Likely viral but unsure. Significant symptoms of burning. Persistent with erythromycin. Discussed with Dr. Eulas Post, ophthalmology who recommended switching to ciprofloxacin. If no improvement in a few days, re-consult vs outpatient follow-up. -Ciprofloxacin 1 drop both eyes QID  Rash Scrotal. Appears  allergic. Improving. -Hydrocortisone cream -Continue benadryl  Weakness Generalized. Lightheadedness with standing. Improved. Orthostatic vitals negative -PT recommending SNF  Hypokalemia Hypomagnesemia Replete as needed.  Essential hypertension Well controlled. Not on medications as an outpatient.  Alcohol abuse Alcohol withdrawal Withdrawal symptoms resolved.  Hyponatremia Resolved with IV fluids.  Dehydration Resolved with IV fluids.  Pancreatic tail lesion Favored to be a pseudocyst but indolent cystic neoplasm cannot be ruled out recommendations for pre and post-contrast MRI/MRCP in 1 year.   DVT prophylaxis: Heparin subcu Code Status:   Code Status: Full Code Family Communication: None Disposition Plan: Discharge to SNF vs HH when able   Consultants:   [facility]  General surgery  Procedures:   None  Antimicrobials:  Vancomycin  Cefepime  Doxycycline   Subjective: Vision is improving. Eye discharge is improving. Some itching in groin area but improved.  Objective: Vitals:   04/03/19 0416 04/03/19 1502 04/03/19 2049 04/04/19 0504  BP: 115/88 104/75 109/75 108/79  Pulse: 71 80 69 74  Resp:      Temp: 99.2 F (37.3 C) 98.3 F (36.8 C) 98.4 F (36.9 C) 98.7 F (37.1 C)  TempSrc: Oral Oral Oral Oral  SpO2: 96% 97% 96% 94%  Weight: 79.4 kg   78.9 kg  Height:        Intake/Output Summary (Last 24 hours) at 04/04/2019 1012 Last data filed at 04/04/2019 0900 Gross per 24 hour  Intake -  Output 3250 ml  Net -3250 ml   Filed Weights   04/01/19 0447 04/03/19 0416 04/04/19 0504  Weight: 78.5 kg 79.4 kg 78.9 kg    Examination:  General exam: Appears calm and comfortable Respiratory system: Clear to auscultation. Respiratory effort normal. Cardiovascular system: S1 & S2 heard, RRR. No murmurs, rubs, gallops or  clicks. Gastrointestinal system: Abdomen is nondistended, soft and mildly tender. Normal bowel sounds heard. Central  nervous system: Alert and oriented. No focal neurological deficits. Extremities: No edema. No calf tenderness Skin: No cyanosis. No rashes Psychiatry: Judgement and insight appear normal. Mood & affect appropriate.     Data Reviewed: I have personally reviewed following labs and imaging studies  CBC: Recent Labs  Lab 03/30/19 0758  WBC 7.0  HGB 13.0  HCT 39.3  MCV 111.6*  PLT 147*   Basic Metabolic Panel: Recent Labs  Lab 03/30/19 0758 04/02/19 0452 04/03/19 0956  NA 137 137 136  K 4.0 4.0 3.9  CL 104 104 103  CO2 25 27 27   GLUCOSE 88 111* 106*  BUN 5* <5* <5*  CREATININE 0.73 0.69 0.86  CALCIUM 8.7* 8.4* 8.7*   GFR: Estimated Creatinine Clearance: 97.1 mL/min (by C-G formula based on SCr of 0.86 mg/dL). Liver Function Tests: Recent Labs  Lab 04/03/19 0956  AST 93*  ALT 45*  ALKPHOS 197*  BILITOT 0.9  PROT 6.5  ALBUMIN 2.5*   No results for input(s): LIPASE, AMYLASE in the last 168 hours. No results for input(s): AMMONIA in the last 168 hours. Coagulation Profile: No results for input(s): INR, PROTIME in the last 168 hours. Cardiac Enzymes: Recent Labs  Lab 03/31/19 1650  TROPONINI <0.03   BNP (last 3 results) No results for input(s): PROBNP in the last 8760 hours. HbA1C: No results for input(s): HGBA1C in the last 72 hours. CBG: No results for input(s): GLUCAP in the last 168 hours. Lipid Profile: No results for input(s): CHOL, HDL, LDLCALC, TRIG, CHOLHDL, LDLDIRECT in the last 72 hours. Thyroid Function Tests: No results for input(s): TSH, T4TOTAL, FREET4, T3FREE, THYROIDAB in the last 72 hours. Anemia Panel: No results for input(s): VITAMINB12, FOLATE, FERRITIN, TIBC, IRON, RETICCTPCT in the last 72 hours. Sepsis Labs: No results for input(s): PROCALCITON, LATICACIDVEN in the last 168 hours.  Recent Results (from the past 240 hour(s))  Culture, blood (Routine X 2) w Reflex to ID Panel     Status: None   Collection Time: 03/25/19 11:36 AM    Specimen: BLOOD  Result Value Ref Range Status   Specimen Description BLOOD LEFT ANTECUBITAL  Final   Special Requests AEROBIC BOTTLE ONLY Blood Culture adequate volume  Final   Culture   Final    NO GROWTH 5 DAYS Performed at [facility], [address] [place], [place] [address]    Report Status 03/30/2019 FINAL  Final  Culture, blood (Routine X 2) w Reflex to ID Panel     Status: None   Collection Time: 03/25/19 11:41 AM   Specimen: BLOOD RIGHT HAND  Result Value Ref Range Status   Specimen Description BLOOD RIGHT HAND  Final   Special Requests AEROBIC BOTTLE ONLY Blood Culture adequate volume  Final   Culture   Final    NO GROWTH 5 DAYS Performed at [facility], [address] [place], [place] [address]    Report Status 03/30/2019 FINAL  Final  Surgical pcr screen     Status: None   Collection Time: 03/29/19  1:47 PM   Specimen: Nasal Mucosa; Nasal Swab  Result Value Ref Range Status   MRSA, PCR NEGATIVE NEGATIVE Final   Staphylococcus aureus NEGATIVE NEGATIVE Final    Comment: (NOTE) The Xpert SA Assay (FDA approved for NASAL specimens in patients 56 years of age and older), is one component of a comprehensive surveillance program.  It is not intended to diagnose infection nor to guide or monitor treatment. Performed at [facility], [address] [place], [place] [address]          Radiology Studies: Dg Abd Portable 1v  Result Date: 04/03/2019 CLINICAL DATA:  Abdominal pain and tenderness, post cholecystectomy last Thursday EXAM: PORTABLE ABDOMEN - 1 VIEW COMPARISON:  CT abdomen and pelvis 03/23/2019 FINDINGS: Surgical clips RIGHT upper quadrant from cholecystectomy. Nonobstructive bowel gas pattern. Increased stool throughout colon. No bowel dilatation or bowel wall thickening. Bones demineralized. IMPRESSION: Increased stool throughout colon. Electronically Signed   By: Ulyses SouthwardMark  Boles M.D.   On: 04/03/2019 11:33        Scheduled Meds: .  ciprofloxacin  1 drop Both Eyes QID  . folic acid  1 mg Oral Daily  . heparin  5,000 Units Subcutaneous Q8H  . magnesium hydroxide  30 mL Oral Once  . multivitamin with minerals  1 tablet Oral Daily  . nicotine  21 mg Transdermal Daily  . pantoprazole  40 mg Oral Daily  . polyethylene glycol  17 g Oral BID  . potassium & sodium phosphates  1 packet Oral TID WC & HS  . thiamine  100 mg Oral Daily   Continuous Infusions: . lactated ringers 10 mL/hr at 03/29/19 0825     LOS: 11 days     Jacquelin Hawkingalph Christin Mccreedy, MD [facility] 04/04/2019, 10:12 AM  If 7PM-7AM, please contact night-coverage [email or web address]

## 2019-04-05 MED ORDER — POLYETHYLENE GLYCOL 3350 17 G PO PACK: 17.0000 g | PACK | Freq: Every day | ORAL | 0 refills | Status: DC | PRN

## 2019-04-05 MED ORDER — CIPROFLOXACIN HCL 0.3 % OP SOLN: 1.0000 [drp] | Freq: Four times a day (QID) | OPHTHALMIC | 0 refills | Status: AC

## 2019-04-05 MED ORDER — PANTOPRAZOLE SODIUM 40 MG PO TBEC: 40.0000 mg | DELAYED_RELEASE_TABLET | Freq: Every day | ORAL | 0 refills | Status: DC

## 2019-04-05 MED FILL — POLYETHYLENE GLYCOL 3350 PO: 17 | 14 days supply | Qty: 238 | Fill #0

## 2019-04-05 MED FILL — PANTOPRAZOLE SOD DR 40 MG T: 40 | 30 days supply | Qty: 30 | Fill #0

## 2019-04-05 NOTE — Progress Notes (Signed)
Physical Therapy Treatment Patient Details Name: Tom Bender Kareem MRN: [id number] DOB: 1961-05-31 Today's Date: 04/05/2019    History of Present Illness 58 y.o. male with medical history significant of alcohol abuse, tobacco abuse, chronic kidney disease stage II, hypertension, presenting with progressive weakness body aches headaches chest pain and shortness of breath, weakness and falls, along with 8/10 abdominal pain. Pt admitted 6/5 for evaluation for SIRS. s/p cholecystectomy 6/11    PT Comments    Pt is making good progress towards his goals today, however continues to be limited by decreased strength and instability. Pt is currently supervision for bed mobility, and min guard for transfers and ambulation of 500 feet with RW. Given pt's improved mobility he no longer needs SNF level rehab and be well served with HHPT to improve balance and strength. With d/c home pt will require 3 in 1 to maximize safety given decreased caregiver support. Pt is hopeful for d/c home today.    Follow Up Recommendations  Home health PT     Equipment Recommendations  3in1 (PT)       Precautions / Restrictions Precautions Precautions: Fall Precaution Comments: watch HR, hx of falling 3 in last month Restrictions Weight Bearing Restrictions: No    Mobility  Bed Mobility Overal bed mobility: Needs Assistance Bed Mobility: Supine to Sit     Supine to sit: Supervision     General bed mobility comments: supervisio for safety  Transfers Overall transfer level: Needs assistance Equipment used: Rolling walker (2 wheeled) Transfers: Sit to/from Stand Sit to Stand: Min guard         General transfer comment: min guard for safety, able to power up and steady before reaching for RW  Ambulation/Gait Ambulation/Gait assistance: Min guard Gait Distance (Feet): 500 Feet Assistive device: Rolling walker (2 wheeled) Gait Pattern/deviations: Step-through pattern;Decreased step length - right;Decreased  step length - left;Trunk flexed;Scissoring;Ataxic Gait velocity: slowed Gait velocity interpretation: >2.62 ft/sec, indicative of community ambulatory General Gait Details: hands on min guard for safety, increased stability and better management of RW         Balance Overall balance assessment: Needs assistance Sitting-balance support: Feet supported;No upper extremity supported Sitting balance-Leahy Scale: Fair     Standing balance support: Bilateral upper extremity supported;During functional activity Standing balance-Leahy Scale: Fair Standing balance comment: able to static stand without support                            Cognition Arousal/Alertness: Awake/alert Behavior During Therapy: WFL for tasks assessed/performed Overall Cognitive Status: Impaired/Different from baseline Area of Impairment: Problem solving;Safety/judgement                         Safety/Judgement: Decreased awareness of safety   Problem Solving: Slow processing;Requires verbal cues;Requires tactile cues General Comments: pt less impulsive today, with increased command following         General Comments General comments (skin integrity, edema, etc.): VSS      Pertinent Vitals/Pain Pain Assessment: Faces Faces Pain Scale: Hurts a little bit Pain Location: eyes Pain Descriptors / Indicators: Aching;Burning Pain Intervention(s): Limited activity within patient's tolerance;Monitored during session;Repositioned           PT Goals (current goals can now be found in the care plan section) Acute Rehab PT Goals Patient Stated Goal: have less pain PT Goal Formulation: With patient Time For Goal Achievement: 04/10/19 Potential to Achieve Goals: Fair Progress towards  PT goals: Progressing toward goals    Frequency    Min 3X/week      PT Plan Discharge plan needs to be updated;Equipment recommendations need to be updated;Frequency needs to be updated       AM-PAC  PT "6 Clicks" Mobility   Outcome Measure  Help needed turning from your back to your side while in a flat bed without using bedrails?: None Help needed moving from lying on your back to sitting on the side of a flat bed without using bedrails?: None Help needed moving to and from a bed to a chair (including a wheelchair)?: None Help needed standing up from a chair using your arms (e.g., wheelchair or bedside chair)?: None Help needed to walk in hospital room?: A Little Help needed climbing 3-5 steps with a railing? : A Lot 6 Click Score: 21    End of Session Equipment Utilized During Treatment: Gait belt Activity Tolerance: Patient limited by fatigue Patient left: in chair;with call bell/phone within reach;with chair alarm set Nurse Communication: Mobility status PT Visit Diagnosis: Unsteadiness on feet (R26.81);Other abnormalities of gait and mobility (R26.89);Repeated falls (R29.6);Muscle weakness (generalized) (M62.81);History of falling (Z91.81);Difficulty in walking, not elsewhere classified (R26.2);Pain Pain - part of body: (eyes)     Time: 7124-5809 PT Time Calculation (min) (ACUTE ONLY): 18 min  Charges:  $Gait Training: 8-22 mins                     Khamani Daniely B. Migdalia Dk PT, DPT Acute Rehabilitation Services Pager [phone] Office [phone]    [place] 04/05/2019, 2:22 PM

## 2019-04-05 NOTE — TOC Transition Note (Signed)
Transition of Care [facility]) - CM/SW Discharge Note   Patient Details  Name: Tom Bender MRN: [id number] Date of Birth: 1961-08-10  [facility] CM/SW Contact:  Bethena Roys, RN Phone Number: 04/05/2019, 12:08 PM   Clinical Narrative: CM was able to schedule a hospital follow up appointment for the patient at the [facility]. SW with the [facility] is trying to establish transportation for the patient. Patient should have received medicines from the [facility]. No further needs from CM at this time.    Final next level of care: [place] Barriers to Discharge: No Barriers Identified   Patient Goals and CMS Choice Patient states their goals for this hospitalization and ongoing recovery are:: " to get better" CMS Medicare.gov Compare Post Acute Care list provided to:: Patient Choice offered to / list presented to : Patient   Discharge Plan and Services In-house Referral: NA   Post Acute Care Choice: Home Health          HH Arranged: PT, OT [place] Agency: [facility] [employer] Date [place]: 04/04/19 Time [place]: [place] Representative spoke with at [place]: [place] ([place]) Interventions     Readmission Risk Interventions No flowsheet data found.

## 2019-04-17 ENCOUNTER — Telehealth: Payer: Self-pay

## 2019-04-17 NOTE — Telephone Encounter (Signed)
Called and spoke with patient for COVID 19 Screening. Patient had no risk factors and is cleared to come into office for appointment. Thanks! 

## 2019-04-18 ENCOUNTER — Other Ambulatory Visit: Payer: Self-pay

## 2019-04-18 ENCOUNTER — Ambulatory Visit (INDEPENDENT_AMBULATORY_CARE_PROVIDER_SITE_OTHER): Payer: Self-pay | Admitting: Family Medicine

## 2019-04-18 ENCOUNTER — Encounter: Payer: Self-pay | Admitting: Family Medicine

## 2019-04-18 VITALS — BP 121/89 | HR 105 | Temp 99.1°F | Resp 18 | Ht 69.0 in | Wt 174.0 lb

## 2019-04-18 DIAGNOSIS — I10 Essential (primary) hypertension: Secondary | ICD-10-CM

## 2019-04-18 DIAGNOSIS — Z7689 Persons encountering health services in other specified circumstances: Secondary | ICD-10-CM

## 2019-04-18 DIAGNOSIS — Z23 Encounter for immunization: Secondary | ICD-10-CM

## 2019-04-18 DIAGNOSIS — F1010 Alcohol abuse, uncomplicated: Secondary | ICD-10-CM

## 2019-04-18 DIAGNOSIS — E876 Hypokalemia: Secondary | ICD-10-CM

## 2019-04-18 LAB — POCT URINALYSIS DIPSTICK
Blood, UA: NEGATIVE
Glucose, UA: NEGATIVE
Leukocytes, UA: NEGATIVE
Nitrite, UA: NEGATIVE
Protein, UA: POSITIVE — AB
Spec Grav, UA: >=1.03 — AB (ref 1.010–1.025)
Urobilinogen, UA: 0.2 E.U./dL
pH, UA: 5.5 (ref 5.0–8.0)

## 2019-04-18 LAB — POCT GLYCOSYLATED HEMOGLOBIN (HGB A1C): Hemoglobin A1C: 4.7 % (ref 4.0–5.6)

## 2019-04-18 NOTE — Progress Notes (Signed)
Patient [name]  New Patient Encounter Provider: Lanae Boast, [place]    DQQ:[id number]  [id number]  DOB - [birth year]  SUBJECTIVE:   Tom Bender, is a 58 y.o. male who presents to establish care with this clinic.  Patient hospitalized from 03/23/2019-6/17-2020 . Patient presented to the ED with dizziness, falls and abdominal pain for 2 weeks. Found to have elevated LFTs, possible pancreatic cyst, hepatic steatosis and sludge in the gallbladder. Cholecystectomy on 03/29/2019. Patient also with dehydration, alcohol abuse, HTN, hypokalemia, hypomagnesemia, and lactic acidosis.  Patient reports smoking 1 ppd. He denies current alcohol use.  No Known Allergies Past Medical History:  Diagnosis Date  . ETOH abuse   . Hypertension   . Liver disease   . Renal insufficiency    Current Outpatient Medications on File Prior to Visit  Medication Sig Dispense Refill  . pantoprazole (PROTONIX) 40 MG tablet Take 1 tablet (40 mg total) by mouth daily. 30 tablet 0   No current facility-administered medications on file prior to visit.    History reviewed. No pertinent family history. Social History   Socioeconomic History  . Marital status: Single    Spouse name: Not on file  . Number of children: Not on file  . Years of education: Not on file  . Highest education level: Not on file  Occupational History  . Not on file  Social Needs  . Financial resource strain: Not on file  . Food insecurity    Worry: Not on file    Inability: Not on file  . Transportation needs    Medical: Not on file    Non-medical: Not on file  Tobacco Use  . Smoking status: Current Every Day Smoker    Packs/day: 1.50  . Smokeless tobacco: Never Used  Substance and Sexual Activity  . Alcohol use: Yes    Alcohol/week: 12.0 standard drinks    Types: 12 Cans of beer per week  . Drug use: Not Currently  . Sexual activity: Not on file  Lifestyle  . Physical activity     Days per week: Not on file    Minutes per session: Not on file  . Stress: Not on file  Relationships  . Social Herbalist on phone: Not on file    Gets together: Not on file    Attends religious service: Not on file    Active member of club or organization: Not on file    Attends meetings of clubs or organizations: Not on file    Relationship status: Not on file  . Intimate partner violence    Fear of current or ex partner: Not on file    Emotionally abused: Not on file    Physically abused: Not on file    Forced sexual activity: Not on file  Other Topics Concern  . Not on file  Social History Narrative  . Not on file    Review of Systems  Constitutional: Negative.   HENT: Negative.   Eyes: Negative.   Respiratory: Negative.   Cardiovascular: Negative.   Gastrointestinal: Negative.   Genitourinary: Negative.   Musculoskeletal: Negative.   Skin: Negative.   Neurological: Negative.   Psychiatric/Behavioral: Negative.      OBJECTIVE:    BP 121/89 (BP Location: Right Arm, Patient Position: Sitting, Cuff Size: Normal)   Pulse (!) 105   Temp 99.1 F (37.3 C) (Oral)   Resp 18   Ht 5\' 9"  (1.753 m)  Wt 174 lb (78.9 kg)   SpO2 100%   BMI 25.70 kg/m   Physical Exam  Constitutional: He is oriented to person, place, and time and well-developed, well-nourished, and in no distress. No distress.  HENT:  Head: Normocephalic and atraumatic.  Eyes: Pupils are equal, round, and reactive to light. Conjunctivae and EOM are normal.  Neck: Normal range of motion. Neck supple.  Cardiovascular: Normal rate, regular rhythm and intact distal pulses. Exam reveals no gallop and no friction rub.  No murmur heard. Pulmonary/Chest: Effort normal and breath sounds normal. No respiratory distress. He has no wheezes.  Abdominal: Soft. Bowel sounds are normal. There is no abdominal tenderness.  Musculoskeletal: Normal range of motion.        General: No tenderness or edema.   Lymphadenopathy:    He has no cervical adenopathy.  Neurological: He is alert and oriented to person, place, and time. Gait normal.  Skin: Skin is warm and dry.  Psychiatric: Mood, memory, affect and judgment normal.  Nursing note and vitals reviewed.    ASSESSMENT/PLAN: 1. Benign essential HTN Not currently on medications. Will continue to monitor.  - Urinalysis Dipstick  2. Alcohol abuse Patient denies current use. Will continue to monitor.   3. Hypokalemia - Comprehensive metabolic panel  4. Hypomagnesemia - Magnesium  5. Encounter to establish care - HgB A1c-4.7 - Urinalysis Dipstick  6. Need for vaccination Vaccination given in the office visit today. - Tdap vaccine greater than or equal to 7yo IM   Return in about 3 months (around 07/19/2019), or if symptoms worsen or fail to improve, for htn.  The patient was given clear instructions to go to ER or return to medical center if symptoms don't improve, worsen or new problems develop. The patient verbalized understanding. The patient was told to call to get lab results if they haven't heard anything in the next week.     This note has been created with Education officer, environmentalDragon speech recognition software and smart phrase technology. Any transcriptional errors are unintentional.   Ms. Tom L. Riley Lamouglas, FNP-BC Patient Care Center [facility] [address]  [place], [place] [id number] [phone]

## 2019-04-18 NOTE — Patient Instructions (Addendum)
Glucosamine sulfate 750mg  twice a day is a supplement that has been shown to help moderate to severe arthritis. Vitamin D 2000 IU daily Fish oil 2 grams daily.  Tumeric 500mg  twice daily.  Capsaicin topically up to four times a day may also help with pain. Make sure you apply ice to the area.   Hypertension, Adult Hypertension is another name for high blood pressure. High blood pressure forces your heart to work harder to pump blood. This can cause problems over time. There are two numbers in a blood pressure reading. There is a top number (systolic) over a bottom number (diastolic). It is best to have a blood pressure that is below 120/80. Healthy choices can help lower your blood pressure, or you may need medicine to help lower it. What are the causes? The cause of this condition is not known. Some conditions may be related to high blood pressure. What increases the risk?  Smoking.  Having type 2 diabetes mellitus, high cholesterol, or both.  Not getting enough exercise or physical activity.  Being overweight.  Having too much fat, sugar, calories, or salt (sodium) in your diet.  Drinking too much alcohol.  Having long-term (chronic) kidney disease.  Having a family history of high blood pressure.  Age. Risk increases with age.  Race. You may be at higher risk if you are African American.  Gender. Men are at higher risk than women before age 56. After age 56, women are at higher risk than men.  Having obstructive sleep apnea.  Stress. What are the signs or symptoms?  High blood pressure may not cause symptoms. Very high blood pressure (hypertensive crisis) may cause: ? Headache. ? Feelings of worry or nervousness (anxiety). ? Shortness of breath. ? Nosebleed. ? A feeling of being sick to your stomach (nausea). ? Throwing up (vomiting). ? Changes in how you see. ? Very bad chest pain. ? Seizures. How is this treated?  This condition is treated by making healthy  lifestyle changes, such as: ? Eating healthy foods. ? Exercising more. ? Drinking less alcohol.  Your health care provider may prescribe medicine if lifestyle changes are not enough to get your blood pressure under control, and if: ? Your top number is above 130. ? Your bottom number is above 80.  Your personal target blood pressure may vary. Follow these instructions at home: Eating and drinking   If told, follow the DASH eating plan. To follow this plan: ? Fill one half of your plate at each meal with fruits and vegetables. ? Fill one fourth of your plate at each meal with whole grains. Whole grains include whole-wheat pasta, brown rice, and whole-grain bread. ? Eat or drink low-fat dairy products, such as skim milk or low-fat yogurt. ? Fill one fourth of your plate at each meal with low-fat (lean) proteins. Low-fat proteins include fish, chicken without skin, eggs, beans, and tofu. ? Avoid fatty meat, cured and processed meat, or chicken with skin. ? Avoid pre-made or processed food.  Eat less than 1,500 mg of salt each day.  Do not drink alcohol if: ? Your doctor tells you not to drink. ? You are pregnant, may be pregnant, or are planning to become pregnant.  If you drink alcohol: ? Limit how much you use to:  0-1 drink a day for women.  0-2 drinks a day for men. ? Be aware of how much alcohol is in your drink. In the [place] one drink equals one 12 oz bottle  of beer (355 mL), one 5 oz glass of wine (148 mL), or one 1 oz glass of hard liquor (44 mL). Lifestyle   Work with your doctor to stay at a healthy weight or to lose weight. Ask your doctor what the best weight is for you.  Get at least 30 minutes of exercise most days of the week. This may include walking, swimming, or biking.  Get at least 30 minutes of exercise that strengthens your muscles (resistance exercise) at least 3 days a week. This may include lifting weights or doing Pilates.  Do not use any products  that contain nicotine or tobacco, such as cigarettes, e-cigarettes, and chewing tobacco. If you need help quitting, ask your doctor.  Check your blood pressure at home as told by your doctor.  Keep all follow-up visits as told by your doctor. This is important. Medicines  Take over-the-counter and prescription medicines only as told by your doctor. Follow directions carefully.  Do not skip doses of blood pressure medicine. The medicine does not work as well if you skip doses. Skipping doses also puts you at risk for problems.  Ask your doctor about side effects or reactions to medicines that you should watch for. Contact a doctor if you:  Think you are having a reaction to the medicine you are taking.  Have headaches that keep coming back (recurring).  Feel dizzy.  Have swelling in your ankles.  Have trouble with your vision. Get help right away if you:  Get a very bad headache.  Start to feel mixed up (confused).  Feel weak or numb.  Feel faint.  Have very bad pain in your: ? Chest. ? Belly (abdomen).  Throw up more than once.  Have trouble breathing. Summary  Hypertension is another name for high blood pressure.  High blood pressure forces your heart to work harder to pump blood.  For most people, a normal blood pressure is less than 120/80.  Making healthy choices can help lower blood pressure. If your blood pressure does not get lower with healthy choices, you may need to take medicine. This information is not intended to replace advice given to you by your health care provider. Make sure you discuss any questions you have with your health care provider. Document Released: 03/22/2008 Document Revised: 06/14/2018 Document Reviewed: 06/14/2018 Elsevier Patient Education  2020 [employer] Health Maintenance, Male Adopting a healthy lifestyle and getting preventive care are important in promoting health and wellness. Ask your health care provider about:  The  right schedule for you to have regular tests and exams.  Things you can do on your own to prevent diseases and keep yourself healthy. What should I know about diet, weight, and exercise? Eat a healthy diet   Eat a diet that includes plenty of vegetables, fruits, low-fat dairy products, and lean protein.  Do not eat a lot of foods that are high in solid fats, added sugars, or sodium. Maintain a healthy weight Body mass index (BMI) is a measurement that can be used to identify possible weight problems. It estimates body fat based on height and weight. Your health care provider can help determine your BMI and help you achieve or maintain a healthy weight. Get regular exercise Get regular exercise. This is one of the most important things you can do for your health. Most adults should:  Exercise for at least 150 minutes each week. The exercise should increase your heart rate and make you sweat (moderate-intensity exercise).  Do  strengthening exercises at least twice a week. This is in addition to the moderate-intensity exercise.  Spend less time sitting. Even light physical activity can be beneficial. Watch cholesterol and blood lipids Have your blood tested for lipids and cholesterol at 56 years of age, then have this test every 5 years. You may need to have your cholesterol levels checked more often if:  Your lipid or cholesterol levels are high.  You are older than 56 years of age.  You are at high risk for heart disease. What should I know about cancer screening? Many types of cancers can be detected early and may often be prevented. Depending on your health history and family history, you may need to have cancer screening at various ages. This may include screening for:  Colorectal cancer.  Prostate cancer.  Skin cancer.  Lung cancer. What should I know about heart disease, diabetes, and high blood pressure? Blood pressure and heart disease  High blood pressure causes heart  disease and increases the risk of stroke. This is more likely to develop in people who have high blood pressure readings, are of African descent, or are overweight.  Talk with your health care provider about your target blood pressure readings.  Have your blood pressure checked: ? Every 3-5 years if you are 69-24 years of age. ? Every year if you are 34 years old or older.  If you are between the ages of 25 and 1 and are a current or former smoker, ask your health care provider if you should have a one-time screening for abdominal aortic aneurysm (AAA). Diabetes Have regular diabetes screenings. This checks your fasting blood sugar level. Have the screening done:  Once every three years after age 65 if you are at a normal weight and have a low risk for diabetes.  More often and at a younger age if you are overweight or have a high risk for diabetes. What should I know about preventing infection? Hepatitis B If you have a higher risk for hepatitis B, you should be screened for this virus. Talk with your health care provider to find out if you are at risk for hepatitis B infection. Hepatitis C Blood testing is recommended for:  Everyone born from [birth year] through 1963.  Anyone with known risk factors for hepatitis C. Sexually transmitted infections (STIs)  You should be screened each year for STIs, including gonorrhea and chlamydia, if: ? You are sexually active and are younger than 57 years of age. ? You are older than 56 years of age and your health care provider tells you that you are at risk for this type of infection. ? Your sexual activity has changed since you were last screened, and you are at increased risk for chlamydia or gonorrhea. Ask your health care provider if you are at risk.  Ask your health care provider about whether you are at high risk for HIV. Your health care provider may recommend a prescription medicine to help prevent HIV infection. If you choose to take medicine  to prevent HIV, you should first get tested for HIV. You should then be tested every 3 months for as long as you are taking the medicine. Follow these instructions at home: Lifestyle  Do not use any products that contain nicotine or tobacco, such as cigarettes, e-cigarettes, and chewing tobacco. If you need help quitting, ask your health care provider.  Do not use street drugs.  Do not share needles.  Ask your health care provider for help  if you need support or information about quitting drugs. Alcohol use  Do not drink alcohol if your health care provider tells you not to drink.  If you drink alcohol: ? Limit how much you have to 0-2 drinks a day. ? Be aware of how much alcohol is in your drink. In the [place] one drink equals one 12 oz bottle of beer (355 mL), one 5 oz glass of wine (148 mL), or one 1 oz glass of hard liquor (44 mL). General instructions  Schedule regular health, dental, and eye exams.  Stay current with your vaccines.  Tell your health care provider if: ? You often feel depressed. ? You have ever been abused or do not feel safe at home. Summary  Adopting a healthy lifestyle and getting preventive care are important in promoting health and wellness.  Follow your health care provider's instructions about healthy diet, exercising, and getting tested or screened for diseases.  Follow your health care provider's instructions on monitoring your cholesterol and blood pressure. This information is not intended to replace advice given to you by your health care provider. Make sure you discuss any questions you have with your health care provider. Document Released: 04/01/2008 Document Revised: 09/27/2018 Document Reviewed: 09/27/2018 Elsevier Patient Education  2020 Elsevier Inc.  Chronic Back Pain When back pain lasts longer than 3 months, it is called chronic back pain. Pain may get worse at certain times (flare-ups). There are things you can do at home to manage  your pain. Follow these instructions at home: Activity      Avoid bending and other activities that make pain worse.  When standing: ? Keep your upper back and neck straight. ? Keep your shoulders pulled back. ? Avoid slouching.  When sitting: ? Keep your back straight. ? Relax your shoulders. Do not round your shoulders or pull them backward.  Do not sit or stand in one place for long periods of time.  Take short rest breaks during the day. Lying down or standing is usually better than sitting. Resting can help relieve pain.  When sitting or lying down for a long time, do some mild activity or stretching. This will help to prevent stiffness and pain.  Get regular exercise. Ask your doctor what activities are safe for you.  Do not lift anything that is heavier than 10 lb (4.5 kg). To prevent injury when you lift things: ? Bend your knees. ? Keep the weight close to your body. ? Avoid twisting. Managing pain  If told, put ice on the painful area. Your doctor may tell you to use ice for 24-48 hours after a flare-up starts. ? Put ice in a plastic bag. ? Place a towel between your skin and the bag. ? Leave the ice on for 20 minutes, 2-3 times a day.  If told, put heat on the painful area as often as told by your doctor. Use the heat source that your doctor recommends, such as a moist heat pack or a heating pad. ? Place a towel between your skin and the heat source. ? Leave the heat on for 20-30 minutes. ? Remove the heat if your skin turns bright red. This is especially important if you are unable to feel pain, heat, or cold. You may have a greater risk of getting burned.  Soak in a warm bath. This can help relieve pain.  Take over-the-counter and prescription medicines only as told by your doctor. General instructions  Sleep on a firm mattress.  Try lying on your side with your knees slightly bent. If you lie on your back, put a pillow under your knees.  Keep all  follow-up visits as told by your doctor. This is important. Contact a doctor if:  You have pain that does not get better with rest or medicine. Get help right away if:  One or both of your arms or legs feel weak.  One or both of your arms or legs lose feeling (numbness).  You have trouble controlling when you poop (bowel movement) or pee (urinate).  You feel sick to your stomach (nauseous).  You throw up (vomit).  You have belly (abdominal) pain.  You have shortness of breath.  You pass out (faint). Summary  When back pain lasts longer than 3 months, it is called chronic back pain.  Pain may get worse at certain times (flare-ups).  Use ice and heat as told by your doctor. Your doctor may tell you to use ice after flare-ups. This information is not intended to replace advice given to you by your health care provider. Make sure you discuss any questions you have with your health care provider. Document Released: 03/22/2008 Document Revised: 01/25/2019 Document Reviewed: 05/19/2017 Elsevier Patient Education  2020 [employer]

## 2019-04-18 NOTE — Progress Notes (Signed)
Reason for Referral: Sherard Sutch is a 58 y.o. male  Pt was referred by clinic CMA and inpatient RNCM for: transportation  Pt reports the following concerns: does not have reliable transportation to appointments  Plan: 1. Addressed today: CSW was informed by inpatient RNCM of patient's transportation needs during last hospitalization. Patient lives in [place] and does not have reliable transportation to appointments in [place]. CSW provided information on [employer] [facility] also in process of on-boarding with [facility] transportation department to support with patients' transportation needs and CSW will follow to assist patient in signing up for this transportation service when appropriate. 2. Referral: [facility] 3. Follow up: Can support with patient sign up for [facility] transport once [facility] is onboarded.   Estanislado Emms, [facility] [phone]

## 2019-04-20 LAB — COMPREHENSIVE METABOLIC PANEL
ALT: 17 IU/L (ref 0–44)
AST: 41 IU/L — ABNORMAL HIGH (ref 0–40)
Albumin/Globulin Ratio: 1 — ABNORMAL LOW (ref 1.2–2.2)
Albumin: 3.4 g/dL — ABNORMAL LOW (ref 3.8–4.9)
Alkaline Phosphatase: 120 IU/L — ABNORMAL HIGH (ref 39–117)
BUN/Creatinine Ratio: 4 — ABNORMAL LOW (ref 9–20)
BUN: 3 mg/dL — ABNORMAL LOW (ref 6–24)
Bilirubin Total: 0.7 mg/dL (ref 0.0–1.2)
CO2: 20 mmol/L (ref 20–29)
Calcium: 8.8 mg/dL (ref 8.7–10.2)
Chloride: 102 mmol/L (ref 96–106)
Creatinine, Ser: 0.7 mg/dL — ABNORMAL LOW (ref 0.76–1.27)
GFR calc Af Amer: 123 mL/min/{1.73_m2} (ref >59)
GFR calc non Af Amer: 106 mL/min/{1.73_m2} (ref >59)
Globulin, Total: 3.5 g/dL (ref 1.5–4.5)
Glucose: 93 mg/dL (ref 65–99)
Potassium: 4.3 mmol/L (ref 3.5–5.2)
Sodium: 139 mmol/L (ref 134–144)
Total Protein: 6.9 g/dL (ref 6.0–8.5)

## 2019-04-20 LAB — MAGNESIUM: Magnesium: 1.6 mg/dL (ref 1.6–2.3)

## 2019-04-20 LAB — HEPATITIS C ANTIBODY (REFLEX): HCV Ab: 0.2 s/co ratio (ref 0.0–0.9)

## 2019-04-20 LAB — HCV COMMENT:

## 2019-04-23 NOTE — Progress Notes (Signed)
Please let patient know that his labs are stable and improving. No medication changes. Remember to keep follow up appts.

## 2019-04-24 ENCOUNTER — Telehealth: Payer: Self-pay

## 2019-04-24 NOTE — Telephone Encounter (Signed)
-----   Message from Lanae Boast, [place] sent at 04/23/2019 10:47 PM EDT ----- Please let patient know that his labs are stable and improving. No medication changes. Remember to keep follow up appts.

## 2019-04-24 NOTE — Telephone Encounter (Signed)
Called, no answer. Left message to call back. Thanks!  

## 2019-04-25 NOTE — Telephone Encounter (Signed)
Called and spoke with patient. Advised that labs are stable and improving. Advised no medication changes are needed at this time and to keep next scheduled appointment. Thanks !

## 2019-05-01 ENCOUNTER — Telehealth: Payer: Self-pay | Admitting: Clinical

## 2019-05-01 NOTE — Telephone Encounter (Signed)
Patient called clinic to ask about assistance with transportation. Called patient back today and assisted with registration over the phone for [facility] bus.   Patient has a surgery follow up this Thursday, but [facility] can only take patients from [place] to [place] on Tuesdays. Patient will try to find alternate transportation or reschedule his appointment. CSW will follow.

## 2019-06-07 ENCOUNTER — Telehealth: Payer: Self-pay | Admitting: Clinical

## 2019-06-07 NOTE — Telephone Encounter (Signed)
Patient previously identified as needing support with transportation. CSW called patient today and enrolled patient in [employer]. Will assist in scheduling patient's rides to appointments at the [facility] as needed.   Estanislado Emms, [facility] [phone]

## 2019-06-27 ENCOUNTER — Encounter (HOSPITAL_COMMUNITY): Payer: Self-pay

## 2019-06-27 ENCOUNTER — Encounter (HOSPITAL_COMMUNITY): Payer: Self-pay | Admitting: *Deleted

## 2019-07-19 ENCOUNTER — Ambulatory Visit: Payer: Self-pay | Admitting: Family Medicine

## 2019-10-22 ENCOUNTER — Other Ambulatory Visit: Payer: Self-pay

## 2019-10-22 ENCOUNTER — Encounter (HOSPITAL_COMMUNITY): Payer: Self-pay | Admitting: *Deleted

## 2019-10-22 ENCOUNTER — Inpatient Hospital Stay (HOSPITAL_COMMUNITY)
Admission: EM | Admit: 2019-10-22 | Discharge: 2019-11-15 | DRG: 368 | Disposition: A | Discharge status: Home / Self Care | Payer: Self-pay | Attending: Internal Medicine | Admitting: Internal Medicine

## 2019-10-22 DIAGNOSIS — E872 Acidosis: Secondary | ICD-10-CM | POA: Diagnosis present

## 2019-10-22 DIAGNOSIS — K5900 Constipation, unspecified: Secondary | ICD-10-CM | POA: Diagnosis not present

## 2019-10-22 DIAGNOSIS — R571 Hypovolemic shock: Secondary | ICD-10-CM | POA: Diagnosis present

## 2019-10-22 DIAGNOSIS — R52 Pain, unspecified: Secondary | ICD-10-CM

## 2019-10-22 DIAGNOSIS — G8929 Other chronic pain: Secondary | ICD-10-CM | POA: Diagnosis present

## 2019-10-22 DIAGNOSIS — I10 Essential (primary) hypertension: Secondary | ICD-10-CM | POA: Diagnosis present

## 2019-10-22 DIAGNOSIS — F10920 Alcohol use, unspecified with intoxication, uncomplicated: Secondary | ICD-10-CM

## 2019-10-22 DIAGNOSIS — K766 Portal hypertension: Secondary | ICD-10-CM | POA: Diagnosis present

## 2019-10-22 DIAGNOSIS — E871 Hypo-osmolality and hyponatremia: Secondary | ICD-10-CM | POA: Diagnosis present

## 2019-10-22 DIAGNOSIS — L89891 Pressure ulcer of other site, stage 1: Secondary | ICD-10-CM | POA: Diagnosis present

## 2019-10-22 DIAGNOSIS — R269 Unspecified abnormalities of gait and mobility: Secondary | ICD-10-CM

## 2019-10-22 DIAGNOSIS — R109 Unspecified abdominal pain: Secondary | ICD-10-CM

## 2019-10-22 DIAGNOSIS — R7989 Other specified abnormal findings of blood chemistry: Secondary | ICD-10-CM

## 2019-10-22 DIAGNOSIS — R188 Other ascites: Secondary | ICD-10-CM

## 2019-10-22 DIAGNOSIS — I951 Orthostatic hypotension: Secondary | ICD-10-CM | POA: Diagnosis not present

## 2019-10-22 DIAGNOSIS — K922 Gastrointestinal hemorrhage, unspecified: Secondary | ICD-10-CM | POA: Diagnosis present

## 2019-10-22 DIAGNOSIS — K7460 Unspecified cirrhosis of liver: Secondary | ICD-10-CM

## 2019-10-22 DIAGNOSIS — K7011 Alcoholic hepatitis with ascites: Secondary | ICD-10-CM | POA: Diagnosis present

## 2019-10-22 DIAGNOSIS — F17210 Nicotine dependence, cigarettes, uncomplicated: Secondary | ICD-10-CM | POA: Diagnosis present

## 2019-10-22 DIAGNOSIS — F329 Major depressive disorder, single episode, unspecified: Secondary | ICD-10-CM | POA: Diagnosis present

## 2019-10-22 DIAGNOSIS — I8511 Secondary esophageal varices with bleeding: Secondary | ICD-10-CM | POA: Diagnosis present

## 2019-10-22 DIAGNOSIS — E54 Ascorbic acid deficiency: Secondary | ICD-10-CM | POA: Diagnosis present

## 2019-10-22 DIAGNOSIS — Z6823 Body mass index (BMI) 23.0-23.9, adult: Secondary | ICD-10-CM

## 2019-10-22 DIAGNOSIS — K7010 Alcoholic hepatitis without ascites: Secondary | ICD-10-CM | POA: Diagnosis present

## 2019-10-22 DIAGNOSIS — F1010 Alcohol abuse, uncomplicated: Secondary | ICD-10-CM | POA: Diagnosis present

## 2019-10-22 DIAGNOSIS — E43 Unspecified severe protein-calorie malnutrition: Secondary | ICD-10-CM | POA: Insufficient documentation

## 2019-10-22 DIAGNOSIS — R17 Unspecified jaundice: Secondary | ICD-10-CM

## 2019-10-22 DIAGNOSIS — Z20822 Contact with and (suspected) exposure to covid-19: Secondary | ICD-10-CM | POA: Diagnosis present

## 2019-10-22 DIAGNOSIS — L8990 Pressure ulcer of unspecified site, unspecified stage: Secondary | ICD-10-CM | POA: Insufficient documentation

## 2019-10-22 DIAGNOSIS — K226 Gastro-esophageal laceration-hemorrhage syndrome: Principal | ICD-10-CM

## 2019-10-22 DIAGNOSIS — E8770 Fluid overload, unspecified: Secondary | ICD-10-CM | POA: Diagnosis present

## 2019-10-22 DIAGNOSIS — F10239 Alcohol dependence with withdrawal, unspecified: Secondary | ICD-10-CM | POA: Diagnosis present

## 2019-10-22 DIAGNOSIS — T502X5A Adverse effect of carbonic-anhydrase inhibitors, benzothiadiazides and other diuretics, initial encounter: Secondary | ICD-10-CM | POA: Diagnosis not present

## 2019-10-22 DIAGNOSIS — K029 Dental caries, unspecified: Secondary | ICD-10-CM

## 2019-10-22 DIAGNOSIS — K7031 Alcoholic cirrhosis of liver with ascites: Secondary | ICD-10-CM | POA: Diagnosis present

## 2019-10-22 DIAGNOSIS — E860 Dehydration: Secondary | ICD-10-CM | POA: Diagnosis present

## 2019-10-22 DIAGNOSIS — R140 Abdominal distension (gaseous): Secondary | ICD-10-CM

## 2019-10-22 DIAGNOSIS — Z590 Homelessness: Secondary | ICD-10-CM

## 2019-10-22 DIAGNOSIS — K3189 Other diseases of stomach and duodenum: Secondary | ICD-10-CM | POA: Diagnosis present

## 2019-10-22 DIAGNOSIS — E861 Hypovolemia: Secondary | ICD-10-CM | POA: Diagnosis present

## 2019-10-22 DIAGNOSIS — R069 Unspecified abnormalities of breathing: Secondary | ICD-10-CM

## 2019-10-22 DIAGNOSIS — D689 Coagulation defect, unspecified: Secondary | ICD-10-CM | POA: Diagnosis present

## 2019-10-22 DIAGNOSIS — D696 Thrombocytopenia, unspecified: Secondary | ICD-10-CM

## 2019-10-22 DIAGNOSIS — E876 Hypokalemia: Secondary | ICD-10-CM | POA: Diagnosis present

## 2019-10-22 DIAGNOSIS — Z9049 Acquired absence of other specified parts of digestive tract: Secondary | ICD-10-CM

## 2019-10-22 DIAGNOSIS — D6959 Other secondary thrombocytopenia: Secondary | ICD-10-CM | POA: Diagnosis present

## 2019-10-22 DIAGNOSIS — R000 Tachycardia, unspecified: Secondary | ICD-10-CM | POA: Diagnosis present

## 2019-10-22 DIAGNOSIS — D62 Acute posthemorrhagic anemia: Secondary | ICD-10-CM | POA: Diagnosis present

## 2019-10-22 HISTORY — DX: Gastro-esophageal reflux disease without esophagitis: K21.9

## 2019-10-22 HISTORY — DX: Depression, unspecified: F32.A

## 2019-10-22 HISTORY — DX: Anxiety disorder, unspecified: F41.9

## 2019-10-22 LAB — COMPREHENSIVE METABOLIC PANEL
ALT: 60 U/L — ABNORMAL HIGH (ref 0–44)
AST: 195 U/L — ABNORMAL HIGH (ref 15–41)
Albumin: 2.5 g/dL — ABNORMAL LOW (ref 3.5–5.0)
Alkaline Phosphatase: 191 U/L — ABNORMAL HIGH (ref 38–126)
Anion gap: 17 — ABNORMAL HIGH (ref 5–15)
BUN: 14 mg/dL (ref 6–20)
CO2: 18 mmol/L — ABNORMAL LOW (ref 22–32)
Calcium: 7.5 mg/dL — ABNORMAL LOW (ref 8.9–10.3)
Chloride: 87 mmol/L — ABNORMAL LOW (ref 98–111)
Creatinine, Ser: 0.79 mg/dL (ref 0.61–1.24)
GFR calc Af Amer: >60 mL/min (ref >60)
GFR calc non Af Amer: >60 mL/min (ref >60)
Glucose, Bld: 83 mg/dL (ref 70–99)
Potassium: 4.6 mmol/L (ref 3.5–5.1)
Sodium: 122 mmol/L — ABNORMAL LOW (ref 135–145)
Total Bilirubin: 4.5 mg/dL — ABNORMAL HIGH (ref 0.3–1.2)
Total Protein: 6.8 g/dL (ref 6.5–8.1)

## 2019-10-22 LAB — CBC
HCT: 30.2 % — ABNORMAL LOW (ref 39.0–52.0)
Hemoglobin: 10.9 g/dL — ABNORMAL LOW (ref 13.0–17.0)
MCH: 35 pg — ABNORMAL HIGH (ref 26.0–34.0)
MCHC: 36.1 g/dL — ABNORMAL HIGH (ref 30.0–36.0)
MCV: 97.1 fL (ref 80.0–100.0)
Platelets: 71 10*3/uL — ABNORMAL LOW (ref 150–400)
RBC: 3.11 MIL/uL — ABNORMAL LOW (ref 4.22–5.81)
RDW: 16.9 % — ABNORMAL HIGH (ref 11.5–15.5)
WBC: 8.6 10*3/uL (ref 4.0–10.5)
nRBC: 0 % (ref 0.0–0.2)

## 2019-10-22 MED ORDER — LACTATED RINGERS IV BOLUS
1000.0000 mL | Freq: Once | INTRAVENOUS | Status: AC
  Administered 2019-10-22: 1000 mL via INTRAVENOUS

## 2019-10-22 NOTE — ED Triage Notes (Addendum)
Pt here via [facility] for possible GI bleed.  States blood in stool x 2 weeks an peri-umbilical pain since July 2020.  Called ems b/c he felt weaker and the bleeding was getting worse.  ETOH on board.  Initial pressure was 76/44.  Given 1000 ns en-route which increased bp to 100/66.  HR 130.  cbg 116.

## 2019-10-22 NOTE — ED Notes (Signed)
Pt called for recheck VS x2, no response.  

## 2019-10-22 NOTE — ED Notes (Signed)
Registration called for pt multiple times. No answer.

## 2019-10-23 ENCOUNTER — Observation Stay (HOSPITAL_COMMUNITY): Payer: Self-pay

## 2019-10-23 ENCOUNTER — Emergency Department (HOSPITAL_COMMUNITY): Payer: Self-pay

## 2019-10-23 DIAGNOSIS — K7010 Alcoholic hepatitis without ascites: Secondary | ICD-10-CM

## 2019-10-23 DIAGNOSIS — R000 Tachycardia, unspecified: Secondary | ICD-10-CM

## 2019-10-23 DIAGNOSIS — K920 Hematemesis: Secondary | ICD-10-CM

## 2019-10-23 DIAGNOSIS — I10 Essential (primary) hypertension: Secondary | ICD-10-CM

## 2019-10-23 DIAGNOSIS — K921 Melena: Secondary | ICD-10-CM

## 2019-10-23 DIAGNOSIS — K922 Gastrointestinal hemorrhage, unspecified: Secondary | ICD-10-CM | POA: Diagnosis present

## 2019-10-23 DIAGNOSIS — F1010 Alcohol abuse, uncomplicated: Secondary | ICD-10-CM

## 2019-10-23 DIAGNOSIS — D62 Acute posthemorrhagic anemia: Secondary | ICD-10-CM

## 2019-10-23 DIAGNOSIS — E871 Hypo-osmolality and hyponatremia: Secondary | ICD-10-CM

## 2019-10-23 HISTORY — DX: Gastrointestinal hemorrhage, unspecified: K92.2

## 2019-10-23 LAB — COMPREHENSIVE METABOLIC PANEL
ALT: 43 U/L (ref 0–44)
AST: 141 U/L — ABNORMAL HIGH (ref 15–41)
Albumin: 1.9 g/dL — ABNORMAL LOW (ref 3.5–5.0)
Alkaline Phosphatase: 140 U/L — ABNORMAL HIGH (ref 38–126)
Anion gap: 16 — ABNORMAL HIGH (ref 5–15)
BUN: 17 mg/dL (ref 6–20)
CO2: 17 mmol/L — ABNORMAL LOW (ref 22–32)
Calcium: 7.2 mg/dL — ABNORMAL LOW (ref 8.9–10.3)
Chloride: 94 mmol/L — ABNORMAL LOW (ref 98–111)
Creatinine, Ser: 0.67 mg/dL (ref 0.61–1.24)
GFR calc Af Amer: >60 mL/min (ref >60)
GFR calc non Af Amer: >60 mL/min (ref >60)
Glucose, Bld: 57 mg/dL — ABNORMAL LOW (ref 70–99)
Potassium: 4 mmol/L (ref 3.5–5.1)
Sodium: 127 mmol/L — ABNORMAL LOW (ref 135–145)
Total Bilirubin: 3.4 mg/dL — ABNORMAL HIGH (ref 0.3–1.2)
Total Protein: 5.3 g/dL — ABNORMAL LOW (ref 6.5–8.1)

## 2019-10-23 LAB — RAPID URINE DRUG SCREEN, HOSP PERFORMED
Amphetamines: NOT DETECTED
Barbiturates: NOT DETECTED
Benzodiazepines: NOT DETECTED
Cocaine: NOT DETECTED
Opiates: NOT DETECTED
Tetrahydrocannabinol: NOT DETECTED

## 2019-10-23 LAB — CBC
HCT: 23.6 % — ABNORMAL LOW (ref 39.0–52.0)
HCT: 29.1 % — ABNORMAL LOW (ref 39.0–52.0)
Hemoglobin: 8.4 g/dL — ABNORMAL LOW (ref 13.0–17.0)
Hemoglobin: 10.1 g/dL — ABNORMAL LOW (ref 13.0–17.0)
MCH: 35.4 pg — ABNORMAL HIGH (ref 26.0–34.0)
MCH: 33 pg (ref 26.0–34.0)
MCHC: 35.6 g/dL (ref 30.0–36.0)
MCHC: 34.7 g/dL (ref 30.0–36.0)
MCV: 99.6 fL (ref 80.0–100.0)
MCV: 95.1 fL (ref 80.0–100.0)
Platelets: 59 10*3/uL — ABNORMAL LOW (ref 150–400)
Platelets: 49 10*3/uL — ABNORMAL LOW (ref 150–400)
RBC: 2.37 MIL/uL — ABNORMAL LOW (ref 4.22–5.81)
RBC: 3.06 MIL/uL — ABNORMAL LOW (ref 4.22–5.81)
RDW: 17.4 % — ABNORMAL HIGH (ref 11.5–15.5)
RDW: 18.9 % — ABNORMAL HIGH (ref 11.5–15.5)
WBC: 9.1 10*3/uL (ref 4.0–10.5)
WBC: 7.8 10*3/uL (ref 4.0–10.5)
nRBC: 0.2 % (ref 0.0–0.2)
nRBC: 0 % (ref 0.0–0.2)

## 2019-10-23 LAB — LACTIC ACID, PLASMA: Lactic Acid, Venous: 3.2 mmol/L (ref 0.5–1.9)

## 2019-10-23 LAB — ABO/RH: ABO/RH(D): O POS

## 2019-10-23 LAB — AMMONIA: Ammonia: 41 umol/L — ABNORMAL HIGH (ref 9–35)

## 2019-10-23 LAB — PROTIME-INR
INR: 2 — ABNORMAL HIGH (ref 0.8–1.2)
Prothrombin Time: 22.1 seconds — ABNORMAL HIGH (ref 11.4–15.2)

## 2019-10-23 LAB — GLUCOSE, CAPILLARY: Glucose-Capillary: 110 mg/dL — ABNORMAL HIGH (ref 70–99)

## 2019-10-23 LAB — PREPARE RBC (CROSSMATCH)

## 2019-10-23 LAB — PHOSPHORUS: Phosphorus: 2.3 mg/dL — ABNORMAL LOW (ref 2.5–4.6)

## 2019-10-23 LAB — POC OCCULT BLOOD, ED: Fecal Occult Bld: POSITIVE — AB

## 2019-10-23 LAB — SARS CORONAVIRUS 2 (TAT 6-24 HRS): SARS Coronavirus 2: NEGATIVE

## 2019-10-23 LAB — PROCALCITONIN: Procalcitonin: 0.58 ng/mL

## 2019-10-23 LAB — MAGNESIUM: Magnesium: 1.4 mg/dL — ABNORMAL LOW (ref 1.7–2.4)

## 2019-10-23 LAB — ETHANOL: Alcohol, Ethyl (B): 141 mg/dL — ABNORMAL HIGH (ref <10)

## 2019-10-23 MED ORDER — SODIUM CHLORIDE 0.9 % IV SOLN
2.0000 g | INTRAVENOUS | Status: DC
  Administered 2019-10-23 – 2019-10-31 (×9): 2 g via INTRAVENOUS
  Filled 2019-10-23: qty 2
  Filled 2019-10-23 (×3): qty 20
  Filled 2019-10-23 (×2): qty 2
  Filled 2019-10-23: qty 20
  Filled 2019-10-23 (×2): qty 2
  Filled 2019-10-23: qty 20

## 2019-10-23 MED ORDER — ONDANSETRON HCL 4 MG PO TABS
4.0000 mg | ORAL_TABLET | Freq: Four times a day (QID) | ORAL | Status: DC | PRN
  Administered 2019-10-28 – 2019-11-05 (×5): 4 mg via ORAL
  Filled 2019-10-23 (×6): qty 1

## 2019-10-23 MED ORDER — ONDANSETRON HCL 4 MG/2ML IJ SOLN
4.0000 mg | Freq: Four times a day (QID) | INTRAMUSCULAR | Status: DC | PRN
  Administered 2019-10-23 – 2019-11-10 (×7): 4 mg via INTRAVENOUS
  Filled 2019-10-23 (×7): qty 2

## 2019-10-23 MED ORDER — MAGNESIUM SULFATE 2 GM/50ML IV SOLN
2.0000 g | Freq: Once | INTRAVENOUS | Status: AC
  Administered 2019-10-23: 2 g via INTRAVENOUS
  Filled 2019-10-23: qty 50

## 2019-10-23 MED ORDER — THIAMINE HCL 100 MG/ML IJ SOLN
Freq: Once | INTRAVENOUS | Status: AC
  Filled 2019-10-23: qty 1000

## 2019-10-23 MED ORDER — CHLORHEXIDINE GLUCONATE CLOTH 2 % EX PADS
6.0000 | MEDICATED_PAD | Freq: Every day | CUTANEOUS | Status: DC
  Administered 2019-10-23 – 2019-11-15 (×17): 6 via TOPICAL

## 2019-10-23 MED ORDER — SODIUM CHLORIDE 0.9 % IV SOLN
80.0000 mg | Freq: Once | INTRAVENOUS | Status: AC
  Administered 2019-10-23: 80 mg via INTRAVENOUS
  Filled 2019-10-23: qty 80

## 2019-10-23 MED ORDER — SODIUM CHLORIDE 0.9 % IV BOLUS
500.0000 mL | Freq: Once | INTRAVENOUS | Status: AC
  Administered 2019-10-23: 500 mL via INTRAVENOUS

## 2019-10-23 MED ORDER — SODIUM CHLORIDE 0.9 % IV SOLN
8.0000 mg/h | INTRAVENOUS | Status: AC
  Administered 2019-10-23 – 2019-10-25 (×6): 8 mg/h via INTRAVENOUS
  Filled 2019-10-23 (×7): qty 80

## 2019-10-23 MED ORDER — ORAL CARE MOUTH RINSE
15.0000 mL | Freq: Two times a day (BID) | OROMUCOSAL | Status: DC
  Administered 2019-10-23 – 2019-11-15 (×41): 15 mL via OROMUCOSAL

## 2019-10-23 MED ORDER — SODIUM CHLORIDE 0.9 % IV BOLUS
1000.0000 mL | Freq: Once | INTRAVENOUS | Status: AC
  Administered 2019-10-23: 1000 mL via INTRAVENOUS

## 2019-10-23 MED ORDER — LORAZEPAM 2 MG/ML IJ SOLN
1.0000 mg | INTRAMUSCULAR | Status: AC | PRN
  Administered 2019-10-23 – 2019-10-25 (×2): 2 mg via INTRAVENOUS
  Filled 2019-10-23 (×2): qty 1

## 2019-10-23 MED ORDER — SODIUM CHLORIDE 0.9 % IV SOLN
50.0000 ug/h | INTRAVENOUS | Status: DC
  Administered 2019-10-23 – 2019-10-24 (×4): 50 ug/h via INTRAVENOUS
  Filled 2019-10-23 (×7): qty 1

## 2019-10-23 MED ORDER — SODIUM CHLORIDE 0.9% IV SOLUTION: Freq: Once | INTRAVENOUS | Status: AC

## 2019-10-23 MED ORDER — OCTREOTIDE LOAD VIA INFUSION
50.0000 ug | Freq: Once | INTRAVENOUS | Status: AC
  Administered 2019-10-23: 50 ug via INTRAVENOUS
  Filled 2019-10-23: qty 25

## 2019-10-23 MED ORDER — ACETAMINOPHEN 325 MG PO TABS
650.0000 mg | ORAL_TABLET | Freq: Four times a day (QID) | ORAL | Status: DC | PRN
  Administered 2019-10-24 – 2019-11-01 (×4): 650 mg via ORAL
  Filled 2019-10-23 (×4): qty 2

## 2019-10-23 MED ORDER — THIAMINE HCL 100 MG PO TABS
100.0000 mg | ORAL_TABLET | Freq: Every day | ORAL | Status: DC
  Administered 2019-10-25 – 2019-11-15 (×22): 100 mg via ORAL
  Filled 2019-10-23 (×23): qty 1

## 2019-10-23 MED ORDER — SODIUM CHLORIDE 0.9 % IV SOLN: INTRAVENOUS | Status: DC

## 2019-10-23 MED ORDER — ACETAMINOPHEN 650 MG RE SUPP: 650.0000 mg | Freq: Four times a day (QID) | RECTAL | Status: DC | PRN

## 2019-10-23 MED ORDER — PANTOPRAZOLE SODIUM 40 MG IV SOLR
40.0000 mg | Freq: Two times a day (BID) | INTRAVENOUS | Status: DC
  Administered 2019-10-26 – 2019-10-31 (×11): 40 mg via INTRAVENOUS
  Filled 2019-10-23 (×11): qty 40

## 2019-10-23 MED ORDER — VITAMIN K1 10 MG/ML IJ SOLN
5.0000 mg | Freq: Once | INTRAVENOUS | Status: AC
  Administered 2019-10-23: 5 mg via INTRAVENOUS
  Filled 2019-10-23: qty 0.5

## 2019-10-23 MED ORDER — LORAZEPAM 1 MG PO TABS
1.0000 mg | ORAL_TABLET | ORAL | Status: AC | PRN
  Administered 2019-10-24: 1 mg via ORAL
  Administered 2019-10-25: 2 mg via ORAL
  Filled 2019-10-23: qty 1
  Filled 2019-10-23: qty 2

## 2019-10-23 MED ORDER — FOLIC ACID 1 MG PO TABS
1.0000 mg | ORAL_TABLET | Freq: Every day | ORAL | Status: DC
  Administered 2019-10-25 – 2019-11-15 (×22): 1 mg via ORAL
  Filled 2019-10-23 (×22): qty 1

## 2019-10-23 MED ORDER — ADULT MULTIVITAMIN W/MINERALS CH
1.0000 | ORAL_TABLET | Freq: Every day | ORAL | Status: DC
  Administered 2019-10-25 – 2019-11-15 (×22): 1 via ORAL
  Filled 2019-10-23 (×22): qty 1

## 2019-10-23 MED ORDER — THIAMINE HCL 100 MG/ML IJ SOLN
100.0000 mg | Freq: Every day | INTRAMUSCULAR | Status: DC
  Administered 2019-10-23: 100 mg via INTRAVENOUS
  Filled 2019-10-23: qty 2

## 2019-10-23 NOTE — H&P (View-Only) (Signed)
                                                                           [facility] Consult: 8:28 AM 10/23/2019  LOS: 0 days    Referring Provider: Dr Vann  Primary Care Physician:  Patient, No Pcp Per Primary Gastroenterologist:  Dr. Hurrelbrink, [facility] in [place]     Reason for Consultation:  Melenic stools and small volume hematemesis.  Chronic abdominal pain.     HPI: Tom Bender is a 59 y.o. male.  Alcoholic.  Drinks 40 to 80 ounces beer/day.  Depression.  Hyponatremia.  Pancreatitis.  Thrombocytopenia.  Protein calorie malnutrition.  Cholecystectomy 03/2019 03/28/2019 EGD by Dr. William Outlaw for evaluation right upper quadrant pain.  With the exception of gastritis, mucosal nodularity in D2.   Pathology showed mild, reactive gastropathy with erosions.  No H. pylori.  SB biopsies benign, no villous changes or increased intraepithelial lymphocytes  ERCP at [place] [facility] Pancreatic tail mass suspected based on abdominal CT of 03/23/2019.  MRI/MRCP 03/24/2019 showed 1.4 cm cystic lesion at the pancreatic tail favoring pseudocyst.  However follow-up at 1 year recommended to rule out indolent cystic neoplasm.  Hepatic steatosis.  Cholelithiasis with gallbladder wall thickening. 06/04/2019 ERCP.  In [place].  Dr. Hurrelbrink.  For evaluation dilated CBD and suspicion of choledocholithiasis.  "Negative for stone, likely passed a stone. 06/14/2019 colonoscopy.  For evaluation of rectal bleeding.  Dr. [facility].  Findings included internal hemorrhoids. 06/14/2019 EGD.  Small esophageal varices without stigmata of bleeding.  Moderate portal hypertensive gastropathy.  Early development of prominent veins in the proximal stomach suggesting early gastric varices.  Mild antral erosions.  Random biopsies of the duodenum were normal. 08/10/2019 EGD.  Lester Hurrelbrink.  For evaluation  of ongoing abdominal pain, black stools.  Findings included small esophageal varices, not felt to be source of black stool.  Mild portal gastropathy with increased reticular pattern.  No source of bleeding though study incomplete as there was food covering much of the stomach.  Reglan was added.  Patient's been having loose, dark stools for a couple of weeks.  Has chronic abdominal pain that never got better after lap chole, ERCP and multiple EGDs. Takes 2 different meds for his stomach but he does not know the names of these.  Unclear if he is taking NSAIDs but both of the meds he takes are prescribed.  He vomits clear emesis almost every morning.  Patient not eating that well but says his weight is stable.  Denies abdominal distention or lower extremity edema.  Patient got tired of the abdominal pain and progressive weakness, imbalance, disequilibrium and falls at home.  He called the ambulance who brought him to the [facility].  In the ED he vomited a small to moderate amount of red blood and passed dark, bloody stools.   Blood pressures as low as 80s/60s.  Heart rate as high as the 130s. Hgb 10.9 >> 8.4.  Range 12-13 in June.  MCV 97.  Platelets 59K. INR 2. Sodium 122 >> 27.  T bili 4.5 >> 3.4.  Alk Phos 191 >> 140.  AST/ALT 195/60 >> 141/43. Elevated lactic acid 3.2.   CXR showed reticulonodular opacities in the left lung and some more focal consolidation towards the apex.  ?  Acute infectious or inflammatory process? 10/23/2019 abdominal ultrasound shows fatty liver fatty liver, small perihepatic ascites,  Cholecystectomy. PPI, Octreotide drip initiated.   The second of 2 PRBCs have been started he is already received FFP.  Still drinking, drank at least four 40 0z malt liquors over the weekend and 40 ounces a couple of days ago.   Lives with his sister who is also an alcoholic and is in a wheelchair, sounds like he is the caregiver in the relationship. Patient also smokes a pack of cigarettes per  day.  Past Medical History:  Diagnosis Date  . ETOH abuse   . Hypertension   . Liver disease   . Renal insufficiency     Past Surgical History:  Procedure Laterality Date  . BIOPSY  03/28/2019   Procedure: BIOPSY;  Surgeon: Outlaw, William, MD;  Location: [facility] ENDOSCOPY;  Service: Endoscopy;;  . CHOLECYSTECTOMY N/A 03/29/2019   Procedure: LAPAROSCOPIC CHOLECYSTECTOMY WITH INTRAOPERATIVE CHOLANGIOGRAM;  Surgeon: Tsuei, Matthew, MD;  Location: [facility] OR;  Service: General;  Laterality: N/A;  . ESOPHAGOGASTRODUODENOSCOPY (EGD) WITH PROPOFOL Left 03/28/2019   Procedure: ESOPHAGOGASTRODUODENOSCOPY (EGD) WITH PROPOFOL;  Surgeon: Outlaw, William, MD;  Location: [facility] ENDOSCOPY;  Service: Endoscopy;  Laterality: Left;    Prior to Admission medications   Medication Sig Start Date End Date Taking? Authorizing Provider  Multiple Vitamins-Minerals (MULTIVITAMIN WITH MINERALS) tablet Take 1 tablet by mouth daily.   Yes [provider]  pantoprazole (PROTONIX) 40 MG tablet Take 1 tablet (40 mg total) by mouth daily. 04/06/19   Nettey, Ralph A, MD    Scheduled Meds: . folic acid  1 mg Oral Daily  . multivitamin with minerals  1 tablet Oral Daily  . [START ON 10/26/2019] pantoprazole  40 mg Intravenous Q12H  . thiamine  100 mg Oral Daily   Or  . thiamine  100 mg Intravenous Daily   Infusions: . cefTRIAXone (ROCEPHIN)  IV Stopped (10/23/19 0729)  . magnesium sulfate bolus IVPB 2 g (10/23/19 0825)  . octreotide  (SANDOSTATIN)    IV infusion 50 mcg/hr (10/23/19 0727)  . pantoprozole (PROTONIX) infusion 8 mg/hr (10/23/19 0428)  . phytonadione (VITAMIN K) IV     PRN Meds: acetaminophen **OR** acetaminophen, LORazepam **OR** LORazepam, ondansetron **OR** ondansetron (ZOFRAN) IV   Allergies as of 10/22/2019  . (No Known Allergies)    No family history on file.  Social History   Socioeconomic History  . Marital status: Single    Spouse name: Not on file  . Number of children: Not on file    . Years of education: Not on file  . Highest education level: Not on file  Occupational History  . Not on file  Tobacco Use  . Smoking status: Current Every Day Smoker    Packs/day: 1.50  . Smokeless tobacco: Never Used  Substance and Sexual Activity  . Alcohol use: Yes    Alcohol/week: 12.0 standard drinks    Types: 12 Cans of beer per week  . Drug use: Not Currently  . Sexual activity: Not on file  Other Topics Concern  . Not on file  Social History Narrative  . Not on file   Social Determinants of Health   Financial Resource Strain:   . Difficulty of Paying Living Expenses: Not on file  Food Insecurity:   . Worried About Running Out of Food in the Last Year: Not on   file  . Ran Out of Food in the Last Year: Not on file  Transportation Needs:   . Lack of Transportation (Medical): Not on file  . Lack of Transportation (Non-Medical): Not on file  Physical Activity:   . Days of Exercise per Week: Not on file  . Minutes of Exercise per Session: Not on file  Stress:   . Feeling of Stress : Not on file  Social Connections:   . Frequency of Communication with Friends and Family: Not on file  . Frequency of Social Gatherings with Friends and Family: Not on file  . Attends Religious Services: Not on file  . Active Member of Clubs or Organizations: Not on file  . Attends Club or Organization Meetings: Not on file  . Marital Status: Not on file  Intimate Partner Violence:   . Fear of Current or Ex-Partner: Not on file  . Emotionally Abused: Not on file  . Physically Abused: Not on file  . Sexually Abused: Not on file    REVIEW OF SYSTEMS: Constitutional: Weakness, dizziness ENT:  No nose bleeds.  Has few teeth and the remaining teeth are in poor repair.  His dentures got lost earlier this year during one of his hospitalizations. Pulm: Feels short of breath.  Nonproductive cough. CV:  No palpitations, no LE edema.  GU: Noticed that his urine was very dark, reddish colored  as of yesterday. GI: See HPI. Heme: Bruises somewhat easily but other than the GI bleeding, does not have excessive bleeding Transfusions: Receiving blood now. Neuro:  No headaches, no peripheral tingling or numbness Derm:  No itching, no rash or sores.  Endocrine:  No sweats or chills.  No polyuria or dysuria Immunization: Not queried. Travel:  None beyond local counties in last few months.    PHYSICAL EXAM: Vital signs in last 24 hours: Vitals:   10/23/19 0800 10/23/19 0814  BP: (!) 83/60 (!) 87/65  Pulse: (!) 118 (!) 127  Resp: (!) 22 (!) 28  Temp:  98.8 F (37.1 C)  SpO2: 99%    Wt Readings from Last 3 Encounters:  10/22/19 72.6 kg  04/18/19 78.9 kg  04/05/19 78.6 kg    General: Disheveled, somewhat cachectic, chronically ill and acutely ill looking WM. Head: No signs of head trauma.  No facial asymmetry Eyes: Slight scleral icterus.  Conjunctiva pink.  EOMI Ears: Not hard of hearing Nose: No discharge or congestion Mouth: Very poor dentition.  Only front incisors remain on the lower jaw.  These are in poor repair.  Mucosa is moist, pink, clear.  Tongue midline.  No blood in the mouth. Neck: No JVD, no masses, no thyromegaly. Lungs: Clear bilaterally.  Somewhat loose cough periodically.  No dyspnea. Heart: Tachycardia, regular.  No MRG Abdomen: Soft.  Slightly protuberant mild tenderness in the upper to mid abdomen.  No HSM, masses, bruits, hernias.  Bowel sounds hypoactive..   Rectal: Did not perform but advised by ED staff that there was blood per rectum on DRE and he has been passing medium red blood during his stay in the ED. GU: No scrotal edema.  Urine in the bedside urinal is deeply red. Musc/Skeltl: No joint swelling or gross deformity. Extremities: No CCE.  Limbs are a bit thin. Neurologic: Alert.  Oriented x3.  No asterixis.  Appropriate. Skin: No significant bruising, rash or sores. Tattoos: Multiple. Nodes: No cervical adenopathy. Psych: Cooperative,  calm.  Intake/Output from previous day: No intake/output data recorded. Intake/Output this shift: Total I/O   In: 100 [IV Piggyback:100] Out: -   LAB RESULTS: Recent Labs    10/22/19 1915 10/23/19 0452  WBC 8.6 9.1  HGB 10.9* 8.4*  HCT 30.2* 23.6*  PLT 71* 59*   BMET Lab Results  Component Value Date   NA 127 (L) 10/23/2019   NA 122 (L) 10/22/2019   NA 139 04/18/2019   K 4.0 10/23/2019   K 4.6 10/22/2019   K 4.3 04/18/2019   CL 94 (L) 10/23/2019   CL 87 (L) 10/22/2019   CL 102 04/18/2019   CO2 17 (L) 10/23/2019   CO2 18 (L) 10/22/2019   CO2 20 04/18/2019   GLUCOSE 57 (L) 10/23/2019   GLUCOSE 83 10/22/2019   GLUCOSE 93 04/18/2019   BUN 17 10/23/2019   BUN 14 10/22/2019   BUN 3 (L) 04/18/2019   CREATININE 0.67 10/23/2019   CREATININE 0.79 10/22/2019   CREATININE 0.70 (L) 04/18/2019   CALCIUM 7.2 (L) 10/23/2019   CALCIUM 7.5 (L) 10/22/2019   CALCIUM 8.8 04/18/2019   LFT Recent Labs    10/22/19 1915 10/23/19 0452  PROT 6.8 5.3*  ALBUMIN 2.5* 1.9*  AST 195* 141*  ALT 60* 43  ALKPHOS 191* 140*  BILITOT 4.5* 3.4*   PT/INR Lab Results  Component Value Date   INR 2.0 (H) 10/23/2019   Hepatitis Panel No results for input(s): HEPBSAG, HCVAB, HEPAIGM, HEPBIGM in the last 72 hours. C-Diff No components found for: CDIFF Lipase     Component Value Date/Time   LIPASE 35 03/24/2019 0950    Drugs of Abuse     Component Value Date/Time   LABOPIA NONE DETECTED 10/23/2019 0650   COCAINSCRNUR NONE DETECTED 10/23/2019 0650   LABBENZ NONE DETECTED 10/23/2019 0650   AMPHETMU NONE DETECTED 10/23/2019 0650   THCU NONE DETECTED 10/23/2019 0650   LABBARB NONE DETECTED 10/23/2019 0650     RADIOLOGY STUDIES: US Abdomen Limited  Result Date: 10/23/2019 CLINICAL DATA:  58-year-old male with abdominal pain. EXAM: ULTRASOUND ABDOMEN LIMITED RIGHT UPPER QUADRANT COMPARISON:  CT abdomen pelvis dated 03/23/2019. FINDINGS: Gallbladder: Cholecystectomy. Common bile duct:  Diameter: 3 mm Liver: There is diffuse increased liver echogenicity most commonly seen in the setting of fatty infiltration. Superimposed inflammation or fibrosis is not excluded. Clinical correlation is recommended. Portal vein is patent on color Doppler imaging with normal direction of blood flow towards the liver. Other: Small perihepatic free fluid noted. IMPRESSION: 1. Cholecystectomy. 2. Fatty liver. 3. Small perihepatic ascites. Electronically Signed   By: Arash  Radparvar M.D.   On: 10/23/2019 01:14   DG CHEST PORT 1 VIEW  Result Date: 10/23/2019 CLINICAL DATA:  Hematemesis and weakness EXAM: PORTABLE CHEST 1 VIEW COMPARISON:  CT 09/17/2019, radiograph 09/16/2019 FINDINGS: Low lung volumes with more reticulonodular opacities throughout the left lung and some more focal consolidative opacity towards the apex. No pneumothorax or visible effusion. Cardiomediastinal contours are similar to prior counting for differences in technique. No acute osseous or soft tissue abnormality. Support devices overlie the chest. IMPRESSION: 1. Low lung volumes with more reticulonodular opacities throughout the left lung and some more focal consolidative opacity towards the apex. Findings could reflect an acute infectious or inflammatory process. Electronically Signed   By: Price  DeHay M.D.   On: 10/23/2019 03:36     IMPRESSION:   *  GI bleed Hx intermittent abdominal pain and N/V w extensive workup.   Known mild esoph varices, PHG, possible early gastric varices on his latest (3rd for 2020) EGD 07/2019, hemorrhoids on   colonoscopy 06/2019 PPI and octreotide gtt, Rocephin   *   Blood loss anemia.  2 PRBCs ordered.    *   Thrombocytopenia.    *   Coagulopathy.  FFP x 1 transfused.  VVit K 5 mg IV ordered  *   Alcoholic hepatitis.  Alcohol abuse ongoing Hep B, C negative, Hep A IgG positive, Hep A IgM negative in 05/2019.    *    Chronic thrombocytopenia  *     Hyponatremia  *   Alcoholism, alcohol abuse.   Chronic.  Not in remission.   *   Cystic lesion on tail of pancreas, favoring pseudocyst.  MRI/MRCP suggested as fup in 03/2020.    *   Cholecystectomy 03/2019.      PLAN:     *   EGD tmrw.   Ok for clears if tolerated.  Continue the drips of Protonix, octreotide.  Serial CBCs.  Check INR in the morning.   Sarah Gribbin  10/23/2019, 8:28 AM Phone [phone]  GI ATTENDING  History, laboratories, x-rays, prior endoscopy reports reviewed.  Patient seen and examined.  Agree with comprehensive consultation note as outlined above.  Chronic alcoholic with alcoholic liver disease.  Recurrent problems with nausea, vomiting, and abdominal pain secondary to acute alcoholic hepatitis.  He continues to drink, despite this.  Possible causes for GI bleeding include ulcer, esophagitis, Mallory-Weiss tear.  Variceal bleeding possible, though unlikely if the 2 recent upper endoscopy reports are accurate.  However, I do recommend IV octreotide, IV PPI, antiemetics, and alcohol withdrawal protocol.  We are planning upper endoscopy tomorrow.  Okay for teary liquids.The nature of the procedure, as well as the risks, benefits, and alternatives were carefully and thoroughly reviewed with the patient. Ample time for discussion and questions allowed. The patient understood, was satisfied, and agreed to proceed.  The patient is high risk given his liver disease and alcoholism.  We will perform the procedure with monitored anesthesia care.  John N. Perry, Jr., M.D. [facility]     

## 2019-10-23 NOTE — ED Notes (Addendum)
Admitting MD gave verbal orders for 1,056ml bolus and 2 units of blood. AM hospitalist in room upon admin of bolus and agreed to plan

## 2019-10-23 NOTE — ED Notes (Signed)
Paged MD about pt's hypotension and tachycardia @ 0420. Also informed him that pt had episode of frank red blood in vomit. Orders placed. Will continue to monitor

## 2019-10-23 NOTE — ED Notes (Signed)
Attempted to remove pt's pants due to incontinence. Pt had stool on pants. Noticed that pt's brief had a large amount of stool in it and pt stated that he had been wearing it for  "a week". Pt taken to decon room to be cleanned due to brief being partially stuck to skin from wearing it for so long.

## 2019-10-23 NOTE — Progress Notes (Addendum)
Patient tachy to 140, BP 80s/60s.  Had one episode of vomiting with obvious blood in it 1H ago.  1. 1L NS bolus 2. Repeat CBC at 0500 3. Changing diet to NPO in anticipation of EGD this morning 4. Will give GI a call now: spoke with Dr. Russella Dar, plan is EGD early this morning, call back if we cant stabilize with fluids. 5. Will start octreotide 6. Will put on rocephin  CRITICAL CARE Performed by: Hillary Bow.   Total critical care time: 60 minutes  Critical care time was exclusive of separately billable procedures and treating other patients.  Critical care was necessary to treat or prevent imminent or life-threatening deterioration.  Critical care was time spent personally by me on the following activities: development of treatment plan with patient and/or surrogate as well as nursing, discussions with consultants, evaluation of patient's response to treatment, examination of patient, obtaining history from patient or surrogate, ordering and performing treatments and interventions, ordering and review of laboratory studies, ordering and review of radiographic studies, pulse oximetry and re-evaluation of patient's condition.

## 2019-10-23 NOTE — ED Notes (Addendum)
ED TO INPATIENT HANDOFF REPORT  ED Nurse Name and Phone #: Alycia RossettiRyan, [phone]  S Name/Age/Gender Tom Bender 59 y.o. male Room/Bed: 018C/018C  Code Status   Code Status: Full Code  Home/SNF/Other Home Patient oriented to: self, place, time and situation Is this baseline? Yes   Triage Complete: Triage complete  Chief Complaint GI bleed [K92.2]  Triage Note Pt here via [facility] for possible GI bleed.  States blood in stool x 2 weeks an peri-umbilical pain since July 2020.  Called ems b/c he felt weaker and the bleeding was getting worse.  ETOH on board.  Initial pressure was 76/44.  Given 1000 ns en-route which increased bp to 100/66.  HR 130.  cbg 116.    Allergies No Known Allergies  Level of Care/Admitting Diagnosis ED Disposition    ED Disposition Condition Comment   Admit  [facility] [[id number]]  Level of Care: ICU [6]  Covid Evaluation: Asymptomatic Screening Protocol (No Symptoms)  Diagnosis: GI bleed [562130][[id number]]  Admitting Physician: Hillary BowGARDNER, JARED M [[id number]]  Attending Physician: Coralyn HellingSOOD, VINEET [[id number]]  Estimated length of stay: past midnight tomorrow  Certification:: I certify this patient will need inpatient services for at least 2 midnights       B Medical/Surgery History Past Medical History:  Diagnosis Date  . ETOH abuse   . Hypertension   . Liver disease   . Renal insufficiency    Past Surgical History:  Procedure Laterality Date  . BIOPSY  03/28/2019   Procedure: BIOPSY;  Surgeon: Willis Modenautlaw, William, MD;  [facility];  Service: Endoscopy;;  . CHOLECYSTECTOMY N/A 03/29/2019   Procedure: LAPAROSCOPIC CHOLECYSTECTOMY WITH INTRAOPERATIVE CHOLANGIOGRAM;  Surgeon: Manus Ruddsuei, Matthew, MD;  [facility];  Service: General;  Laterality: N/A;  . ESOPHAGOGASTRODUODENOSCOPY (EGD) WITH PROPOFOL Left 03/28/2019   Procedure: ESOPHAGOGASTRODUODENOSCOPY (EGD) WITH PROPOFOL;  Surgeon: Willis Modenautlaw, William, MD;  [facility];   Service: Endoscopy;  Laterality: Left;     A IV Location/Drains/Wounds Patient Lines/Drains/Airways Status   Active Line/Drains/Airways    Name:   Placement date:   Placement time:   Site:   Days:   Peripheral IV 03/30/19 Right;Anterior Forearm   03/30/19    0439    Forearm   207   Peripheral IV 10/22/19 Right Antecubital   10/22/19    --    Antecubital   1   Peripheral IV 10/23/19 Left Forearm   10/23/19    0645    Forearm   less than 1   Peripheral IV 10/23/19 Anterior;Distal;Left;Upper Antecubital   10/23/19    0230    Antecubital   less than 1   Incision - 4 Ports Abdomen 1: Umbilicus 2: Mid;Upper 3: Right;Medial 4: Right;Lateral   03/29/19    0933     208          Intake/Output Last 24 hours  Intake/Output Summary (Last 24 hours) at 10/23/2019 1712 Last data filed at 10/23/2019 1606 Gross per 24 hour  Intake 7038 ml  Output --  Net 7038 ml    Labs/Imaging Results for orders placed or performed during the hospital encounter of 10/22/19 (from the past 48 hour(s))  Comprehensive metabolic panel     Status: Abnormal   Collection Time: 10/22/19  7:15 PM  Result Value Ref Range   Sodium 122 (L) 135 - 145 mmol/L   Potassium 4.6 3.5 - 5.1 mmol/L   Chloride 87 (L) 98 - 111 mmol/L   CO2 18 (L) 22 -  32 mmol/L   Glucose, Bld 83 70 - 99 mg/dL   BUN 14 6 - 20 mg/dL   Creatinine, Ser 1.190.79 0.61 - 1.24 mg/dL   Calcium 7.5 (L) 8.9 - 10.3 mg/dL   Total Protein 6.8 6.5 - 8.1 g/dL   Albumin 2.5 (L) 3.5 - 5.0 g/dL   AST 147195 (H) 15 - 41 U/L   ALT 60 (H) 0 - 44 U/L   Alkaline Phosphatase 191 (H) 38 - 126 U/L   Total Bilirubin 4.5 (H) 0.3 - 1.2 mg/dL   GFR calc non Af Amer >60 >60 mL/min   GFR calc Af Amer >60 >60 mL/min   Anion gap 17 (H) 5 - 15    Comment: Performed at [facility], [address] [place], [place] [address]  CBC     Status: Abnormal   Collection Time: 10/22/19  7:15 PM  Result Value Ref Range   WBC 8.6 4.0 - 10.5 K/uL   RBC 3.11 (L) 4.22 - 5.81 MIL/uL    Hemoglobin 10.9 (L) 13.0 - 17.0 g/dL   HCT 21.330.2 (L) 08.639.0 - 57.852.0 %   MCV 97.1 80.0 - 100.0 fL   MCH 35.0 (H) 26.0 - 34.0 pg   MCHC 36.1 (H) 30.0 - 36.0 g/dL   RDW 46.916.9 (H) 62.911.5 - 52.815.5 %   Platelets 71 (L) 150 - 400 K/uL    Comment: REPEATED TO VERIFY PLATELET COUNT CONFIRMED BY SMEAR Immature Platelet Fraction may be clinically indicated, consider ordering this additional test [id number]    nRBC 0.0 0.0 - 0.2 %    Comment: Performed at [facility], [address] [place], [place] [address]  Type and screen [facility]     Status: None (Preliminary result)   Collection Time: 10/22/19  7:15 PM  Result Value Ref Range   ABO/RH(D) O POS    Antibody Screen NEG    Sample Expiration 10/25/2019,2359    Unit Number [id number]    Blood Component Type RED CELLS,LR    Unit division 00    Status of Unit ISSUED    Transfusion Status OK TO TRANSFUSE    Crossmatch Result Compatible    Unit Number [id number]    Blood Component Type RED CELLS,LR    Unit division 00    Status of Unit ISSUED    Transfusion Status OK TO TRANSFUSE    Crossmatch Result      Compatible Performed at [facility], [address] [place], [place] [address]   ABO/Rh     Status: None   Collection Time: 10/22/19  7:15 PM  Result Value Ref Range   ABO/RH(D)      O POS Performed at [facility], [address] [place], [place] [address]   POC occult blood, ED     Status: Abnormal   Collection Time: 10/23/19 12:32 AM  Result Value Ref Range   Fecal Occult Bld POSITIVE (A) NEGATIVE  Ammonia     Status: Abnormal   Collection Time: 10/23/19 12:36 AM  Result Value Ref Range   Ammonia 41 (H) 9 - 35 umol/L    Comment: Performed at [facility], [address] [place], [place] [address]  Ethanol     Status: Abnormal   Collection Time: 10/23/19 12:36 AM  Result Value Ref Range   Alcohol, Ethyl (B) 141 (H) <10 mg/dL    Comment: (NOTE) Lowest detectable limit for serum  alcohol  is 10 mg/dL. For medical purposes only. Performed at [facility], [address], [place], [place] [address]   CBC     Status: Abnormal   Collection Time: 10/23/19  4:52 AM  Result Value Ref Range   WBC 9.1 4.0 - 10.5 K/uL   RBC 2.37 (L) 4.22 - 5.81 MIL/uL   Hemoglobin 8.4 (L) 13.0 - 17.0 g/dL    Comment: REPEATED TO VERIFY   HCT 23.6 (L) 39.0 - 52.0 %   MCV 99.6 80.0 - 100.0 fL   MCH 35.4 (H) 26.0 - 34.0 pg   MCHC 35.6 30.0 - 36.0 g/dL   RDW 17.0 (H) 01.7 - 49.4 %   Platelets 59 (L) 150 - 400 K/uL    Comment: REPEATED TO VERIFY Immature Platelet Fraction may be clinically indicated, consider ordering this additional test [id number] CONSISTENT WITH PREVIOUS RESULT    nRBC 0.2 0.0 - 0.2 %    Comment: Performed at [facility], [address], [place], [place] [address]  Comprehensive metabolic panel     Status: Abnormal   Collection Time: 10/23/19  4:52 AM  Result Value Ref Range   Sodium 127 (L) 135 - 145 mmol/L   Potassium 4.0 3.5 - 5.1 mmol/L   Chloride 94 (L) 98 - 111 mmol/L   CO2 17 (L) 22 - 32 mmol/L   Glucose, Bld 57 (L) 70 - 99 mg/dL   BUN 17 6 - 20 mg/dL   Creatinine, Ser 5.99 0.61 - 1.24 mg/dL   Calcium 7.2 (L) 8.9 - 10.3 mg/dL   Total Protein 5.3 (L) 6.5 - 8.1 g/dL   Albumin 1.9 (L) 3.5 - 5.0 g/dL   AST 357 (H) 15 - 41 U/L   ALT 43 0 - 44 U/L   Alkaline Phosphatase 140 (H) 38 - 126 U/L   Total Bilirubin 3.4 (H) 0.3 - 1.2 mg/dL   GFR calc non Af Amer >60 >60 mL/min   GFR calc Af Amer >60 >60 mL/min   Anion gap 16 (H) 5 - 15    Comment: Performed at [facility], [address], [place], [place] [address]  Protime-INR     Status: Abnormal   Collection Time: 10/23/19  4:52 AM  Result Value Ref Range   Prothrombin Time 22.1 (H) 11.4 - 15.2 seconds   INR 2.0 (H) 0.8 - 1.2    Comment: (NOTE) INR goal varies based on device and disease states. Performed at [facility], [address] [place], [place] [address]   Lactic  acid, plasma     Status: Abnormal   Collection Time: 10/23/19  5:40 AM  Result Value Ref Range   Lactic Acid, Venous 3.2 (HH) 0.5 - 1.9 mmol/L    Comment: CRITICAL RESULT CALLED TO, READ BACK BY AND VERIFIED WITH: R Debroah Shuttleworth,RN 10/23/2019 0804 WILDERK Performed at [facility], [address], [place], [place] [address]   Magnesium     Status: Abnormal   Collection Time: 10/23/19  5:46 AM  Result Value Ref Range   Magnesium 1.4 (L) 1.7 - 2.4 mg/dL    Comment: Performed at [facility], [address] [place], [place] [address]  Phosphorus     Status: Abnormal   Collection Time: 10/23/19  5:46 AM  Result Value Ref Range   Phosphorus 2.3 (L) 2.5 - 4.6 mg/dL    Comment: Performed at [facility], [address] [place], [place]  [address]  Urine rapid drug screen (hosp performed)     Status: None   Collection Time: 10/23/19  6:50 AM  Result Value Ref Range   Opiates NONE DETECTED NONE DETECTED   Cocaine NONE DETECTED NONE DETECTED   Benzodiazepines NONE DETECTED NONE DETECTED   Amphetamines NONE DETECTED NONE DETECTED   Tetrahydrocannabinol NONE DETECTED NONE DETECTED   Barbiturates NONE DETECTED NONE DETECTED    Comment: (NOTE) DRUG SCREEN FOR MEDICAL PURPOSES ONLY.  IF CONFIRMATION IS NEEDED FOR ANY PURPOSE, NOTIFY LAB WITHIN 5 DAYS. LOWEST DETECTABLE LIMITS FOR URINE DRUG SCREEN Drug Class                     Cutoff (ng/mL) Amphetamine and metabolites    1000 Barbiturate and metabolites    200 Benzodiazepine                 200 Tricyclics and metabolites     300 Opiates and metabolites        300 Cocaine and metabolites        300 THC                            50 Performed at [facility], [address] [place], [place] [address]   SARS CORONAVIRUS 2 (TAT 6-24 HRS) Nasopharyngeal Nasopharyngeal Swab     Status: None   Collection Time: 10/23/19  6:50 AM   Specimen: Nasopharyngeal Swab  Result Value Ref Range   SARS Coronavirus 2 NEGATIVE  NEGATIVE    Comment: (NOTE) SARS-CoV-2 target nucleic acids are NOT DETECTED. The SARS-CoV-2 RNA is generally detectable in upper and lower respiratory specimens during the acute phase of infection. Negative results do not preclude SARS-CoV-2 infection, do not rule out co-infections with other pathogens, and should not be used as the sole basis for treatment or other patient management decisions. Negative results must be combined with clinical observations, patient history, and epidemiological information. The expected result is Negative. Fact Sheet for Patients: HairSlick.no Fact Sheet for Healthcare Providers: quierodirigir.com This test is not yet approved or cleared by the [place] FDA and  has been authorized for detection and/or diagnosis of SARS-CoV-2 by FDA under an Emergency Use Authorization (EUA). This EUA will remain  in effect (meaning this test can be used) for the duration of the COVID-19 declaration under Section 56 4(b)(1) of the Act, 21 U.S.C. section 360bbb-3(b)(1), unless the authorization is terminated or revoked sooner. Performed at [facility], [address] [place], [place] [address]   Prepare RBC     Status: None   Collection Time: 10/23/19  7:30 AM  Result Value Ref Range   Order Confirmation      ORDER PROCESSED BY BLOOD BANK Performed at [facility], [address], [place], [place] [address]   Prepare fresh frozen plasma     Status: None (Preliminary result)   Collection Time: 10/23/19  7:45 AM  Result Value Ref Range   Unit Number [id number]    Blood Component Type THW PLS APHR    Unit division B0    Status of Unit ISSUED    Transfusion Status      OK TO TRANSFUSE Performed at [facility], [address] [place], [place] [address]    US Abdomen Limited  Result Date: 10/23/2019 CLINICAL DATA:  59 year old male with abdominal pain. EXAM: ULTRASOUND ABDOMEN  LIMITED RIGHT UPPER  QUADRANT COMPARISON:  CT abdomen pelvis dated 03/23/2019. FINDINGS: Gallbladder: Cholecystectomy. Common bile duct: Diameter: 3 mm Liver: There is diffuse increased liver echogenicity most commonly seen in the setting of fatty infiltration. Superimposed inflammation or fibrosis is not excluded. Clinical correlation is recommended. Portal vein is patent on color Doppler imaging with normal direction of blood flow towards the liver. Other: Small perihepatic free fluid noted. IMPRESSION: 1. Cholecystectomy. 2. Fatty liver. 3. Small perihepatic ascites. Electronically Signed   By: Elgie Collard M.D.   On: 10/23/2019 01:14   DG CHEST PORT 1 VIEW  Result Date: 10/23/2019 CLINICAL DATA:  Hematemesis and weakness EXAM: PORTABLE CHEST 1 VIEW COMPARISON:  CT 09/17/2019, radiograph 09/16/2019 FINDINGS: Low lung volumes with more reticulonodular opacities throughout the left lung and some more focal consolidative opacity towards the apex. No pneumothorax or visible effusion. Cardiomediastinal contours are similar to prior counting for differences in technique. No acute osseous or soft tissue abnormality. Support devices overlie the chest. IMPRESSION: 1. Low lung volumes with more reticulonodular opacities throughout the left lung and some more focal consolidative opacity towards the apex. Findings could reflect an acute infectious or inflammatory process. Electronically Signed   By: Kreg Shropshire M.D.   On: 10/23/2019 03:36    Pending Labs Unresulted Labs (From admission, onward)    Start     Ordered   10/24/19 0500  AM Basic metabolic panel  Tomorrow morning,   R     10/23/19 0916   10/24/19 0500  AM Magnesium  Tomorrow morning,   R     10/23/19 0916   10/24/19 0500  AM Phosphorus  Tomorrow morning,   R     10/23/19 0916   10/24/19 0500  Procalcitonin  Daily,   R     10/23/19 0918   10/24/19 0500  Lactic acid, plasma  Once,   STAT     10/23/19 0919   10/24/19 0500  Protime-INR  Tomorrow  morning,   R     10/23/19 1354   10/23/19 1900  CBC  Now then every 6 hours,   R (with STAT occurrences)     10/23/19 1347   10/23/19 0919  Procalcitonin - Baseline  ONCE - STAT,   STAT     10/23/19 0918   10/22/19 1931  Protime-INR  ONCE - STAT,   STAT     10/22/19 1930          Vitals/Pain Today's Vitals   10/23/19 1603 10/23/19 1615 10/23/19 1630 10/23/19 1645  BP: 103/71 106/82 107/86 106/83  Pulse: (!) 118 (!) 108 (!) 106 (!) 111  Resp: 18     Temp: (!) 97.4 F (36.3 C)     TempSrc: Oral     SpO2:  95% 96% 96%  Weight:      Height:      PainSc:        Isolation Precautions No active isolations  Medications Medications  LORazepam (ATIVAN) tablet 1-4 mg ( Oral See Alternative 10/23/19 0247)    Or  LORazepam (ATIVAN) injection 1-4 mg (2 mg Intravenous Given 10/23/19 0247)  thiamine tablet 100 mg (has no administration in time range)    Or  thiamine (B-1) injection 100 mg (has no administration in time range)  folic acid (FOLVITE) tablet 1 mg (1 mg Oral Not Given 10/23/19 1501)  multivitamin with minerals tablet 1 tablet (1 tablet Oral Not Given 10/23/19 1501)  acetaminophen (TYLENOL) tablet 650 mg (has no administration in time range)  Or  acetaminophen (TYLENOL) suppository 650 mg (has no administration in time range)  ondansetron (ZOFRAN) tablet 4 mg ( Oral See Alternative 10/23/19 0241)    Or  ondansetron (ZOFRAN) injection 4 mg (4 mg Intravenous Given 10/23/19 0241)  pantoprazole (PROTONIX) 80 mg in sodium chloride 0.9 % 250 mL (0.32 mg/mL) infusion (8 mg/hr Intravenous New Bag/Given 10/23/19 1308)  pantoprazole (PROTONIX) injection 40 mg (has no administration in time range)  octreotide (SANDOSTATIN) 2 mcg/mL load via infusion 50 mcg (50 mcg Intravenous Bolus from Bag 10/23/19 0728)    And  octreotide (SANDOSTATIN) 500 mcg in sodium chloride 0.9 % 250 mL (2 mcg/mL) infusion (50 mcg/hr Intravenous New Bag/Given 10/23/19 1605)  cefTRIAXone (ROCEPHIN) 2 g in sodium chloride  0.9 % 100 mL IVPB (0 g Intravenous Stopped 10/23/19 0729)  0.9 %  sodium chloride infusion ( Intravenous New Bag/Given 10/23/19 1319)  lactated ringers bolus 1,000 mL (0 mLs Intravenous Stopped 10/23/19 0859)  sodium chloride 0.9 % bolus 500 mL (0 mLs Intravenous Stopped 10/23/19 0859)  sodium chloride 0.9 % 1,000 mL with thiamine 314 mg, folic acid 1 mg, multivitamins adult 10 mL infusion ( Intravenous Stopped 10/23/19 0800)  pantoprazole (PROTONIX) 80 mg in sodium chloride 0.9 % 100 mL IVPB (80 mg Intravenous Bolus 10/23/19 0428)  sodium chloride 0.9 % bolus 1,000 mL (0 mLs Intravenous Stopped 10/23/19 0859)  sodium chloride 0.9 % bolus 500 mL (0 mLs Intravenous Stopped 10/23/19 1605)  sodium chloride 0.9 % bolus 1,000 mL (0 mLs Intravenous Stopped 10/23/19 1606)  0.9 %  sodium chloride infusion (Manually program via Guardrails IV Fluids) ( Intravenous Stopped 10/23/19 1014)  magnesium sulfate IVPB 2 g 50 mL (0 g Intravenous Stopped 10/23/19 1003)  phytonadione (VITAMIN K) 5 mg in dextrose 5 % 50 mL IVPB (0 mg Intravenous Stopped 10/23/19 1013)  0.9 %  sodium chloride infusion (Manually program via Guardrails IV Fluids) ( Intravenous New Bag/Given 10/23/19 0826)    Mobility walks     Focused Assessments    R Recommendations: See Admitting Provider Note  Report given to: 2H RN  Additional Notes:

## 2019-10-23 NOTE — Consult Note (Addendum)
[facility] Consult: 8:28 AM 10/23/2019  LOS: 0 days    Referring Provider: Dr Eliseo Squires  Primary Care Physician:  Patient, No Pcp Per Primary Gastroenterologist:  Dr. Alonza Bogus, [place] [facility]ir in [facility]     Reason for Consultation:  Melenic stools and small volume hematemesis.  Chronic abdominal pain.     HPI: Tom Bender is a 59 y.o. male.  Alcoholic.  Drinks 40 to 80 ounces beer/day.  Depression.  Hyponatremia.  Pancreatitis.  Thrombocytopenia.  Protein calorie malnutrition.  Cholecystectomy 03/2019 03/28/2019 EGD by Dr. Arta Silence for evaluation right upper quadrant pain.  With the exception of gastritis, mucosal nodularity in D2.   Pathology showed mild, reactive gastropathy with erosions.  No H. pylori.  SB biopsies benign, no villous changes or increased intraepithelial lymphocytes  ERCP at [facility] Pancreatic tail mass suspected based on abdominal CT of 03/23/2019.  MRI/MRCP 03/24/2019 showed 1.4 cm cystic lesion at the pancreatic tail favoring pseudocyst.  However follow-up at 1 year recommended to rule out indolent cystic neoplasm.  Hepatic steatosis.  Cholelithiasis with gallbladder wall thickening. 06/04/2019 ERCP.  In [facility].  Dr. Alonza Bogus.  For evaluation dilated CBD and suspicion of choledocholithiasis.  "Negative for stone, likely passed a stone. 06/14/2019 colonoscopy.  For evaluation of rectal bleeding.  Dr. Mitchell Heir.  Findings included internal hemorrhoids. 06/14/2019 EGD.  Small esophageal varices without stigmata of bleeding.  Moderate portal hypertensive gastropathy.  Early development of prominent veins in the proximal stomach suggesting early gastric varices.  Mild antral erosions.  Random biopsies of the duodenum were normal. 08/10/2019 EGD.  [name].  For evaluation  of ongoing abdominal pain, black stools.  Findings included small esophageal varices, not felt to be source of black stool.  Mild portal gastropathy with increased reticular pattern.  No source of bleeding though study incomplete as there was food covering much of the stomach.  Reglan was added.  Patient's been having loose, dark stools for a couple of weeks.  Has chronic abdominal pain that never got better after lap chole, ERCP and multiple EGDs. Takes 2 different meds for his stomach but he does not know the names of these.  Unclear if he is taking NSAIDs but both of the meds he takes are prescribed.  He vomits clear emesis almost every morning.  Patient not eating that well but says his weight is stable.  Denies abdominal distention or lower extremity edema.  Patient got tired of the abdominal pain and progressive weakness, imbalance, disequilibrium and falls at home.  He called the ambulance who brought him to the [facility] ED.  In the ED he vomited a small to moderate amount of red blood and passed dark, bloody stools.   Blood pressures as low as 80s/60s.  Heart rate as high as the 130s. Hgb 10.9 >> 8.4.  Range 12-13 in June.  MCV 97.  Platelets 59K. INR 2. Sodium 122 >> 27.  T bili 4.5 >> 3.4.  Alk Phos 191 >> 140.  AST/ALT 195/60 >> 141/43. Elevated lactic acid 3.2.  CXR showed reticulonodular opacities in the left lung and some more focal consolidation towards the apex.  ?  Acute infectious or inflammatory process? 10/23/2019 abdominal ultrasound shows fatty liver fatty liver, small perihepatic ascites,  Cholecystectomy. PPI, Octreotide drip initiated.   The second of 2 PRBCs have been started he is already received FFP.  Still drinking, drank at least four 40 0z malt liquors over the weekend and 40 ounces a couple of days ago.   Lives with his sister who is also an alcoholic and is in a wheelchair, sounds like he is the caregiver in the relationship. Patient also smokes a pack of cigarettes per  day.  Past Medical History:  Diagnosis Date  . ETOH abuse   . Hypertension   . Liver disease   . Renal insufficiency     Past Surgical History:  Procedure Laterality Date  . BIOPSY  03/28/2019   Procedure: BIOPSY;  Surgeon: Arta Silence, MD;  Location: [place];  Service: Endoscopy;;  . CHOLECYSTECTOMY N/A 03/29/2019   Procedure: LAPAROSCOPIC CHOLECYSTECTOMY WITH INTRAOPERATIVE CHOLANGIOGRAM;  Surgeon: Donnie Mesa, MD;  Location: [place];  Service: General;  Laterality: N/A;  . ESOPHAGOGASTRODUODENOSCOPY (EGD) WITH PROPOFOL Left 03/28/2019   Procedure: ESOPHAGOGASTRODUODENOSCOPY (EGD) WITH PROPOFOL;  Surgeon: Arta Silence, MD;  Location: [facility] ENDOSCOPY;  Service: Endoscopy;  Laterality: Left;    Prior to Admission medications   Medication Sig Start Date End Date Taking? Authorizing Provider  Multiple Vitamins-Minerals (MULTIVITAMIN WITH MINERALS) tablet Take 1 tablet by mouth daily.   Yes [provider]  pantoprazole (PROTONIX) 40 MG tablet Take 1 tablet (40 mg total) by mouth daily. 04/06/19   Mariel Aloe, MD    Scheduled Meds: . folic acid  1 mg Oral Daily  . multivitamin with minerals  1 tablet Oral Daily  . [START ON 10/26/2019] pantoprazole  40 mg Intravenous Q12H  . thiamine  100 mg Oral Daily   Or  . thiamine  100 mg Intravenous Daily   Infusions: . cefTRIAXone (ROCEPHIN)  IV Stopped (10/23/19 0729)  . magnesium sulfate bolus IVPB 2 g (10/23/19 0825)  . octreotide  (SANDOSTATIN)    IV infusion 50 mcg/hr (10/23/19 0727)  . pantoprozole (PROTONIX) infusion 8 mg/hr (10/23/19 0428)  . phytonadione (VITAMIN K) IV     PRN Meds: acetaminophen **OR** acetaminophen, LORazepam **OR** LORazepam, ondansetron **OR** ondansetron (ZOFRAN) IV   Allergies as of 10/22/2019  . (No Known Allergies)    No family history on file.  Social History   Socioeconomic History  . Marital status: Single    Spouse name: Not on file  . Number of children: Not on file    . Years of education: Not on file  . Highest education level: Not on file  Occupational History  . Not on file  Tobacco Use  . Smoking status: Current Every Day Smoker    Packs/day: 1.50  . Smokeless tobacco: Never Used  Substance and Sexual Activity  . Alcohol use: Yes    Alcohol/week: 12.0 standard drinks    Types: 12 Cans of beer per week  . Drug use: Not Currently  . Sexual activity: Not on file  Other Topics Concern  . Not on file  Social History Narrative  . Not on file   Social Determinants of Health   Financial Resource Strain:   . Difficulty of Paying Living Expenses: Not on file  Food Insecurity:   . Worried About Charity fundraiser in the Last Year: Not on  file  . [place] in the Last Year: Not on file  Transportation Needs:   . Lack of Transportation (Medical): Not on file  . Lack of Transportation (Non-Medical): Not on file  Physical Activity:   . Days of Exercise per Week: Not on file  . Minutes of Exercise per Session: Not on file  Stress:   . Feeling of Stress : Not on file  Social Connections:   . Frequency of Communication with Friends and Family: Not on file  . Frequency of Social Gatherings with Friends and Family: Not on file  . Attends Religious Services: Not on file  . Active Member of Clubs or Organizations: Not on file  . Attends Archivist Meetings: Not on file  . Marital Status: Not on file  Intimate Partner Violence:   . Fear of Current or Ex-Partner: Not on file  . Emotionally Abused: Not on file  . Physically Abused: Not on file  . Sexually Abused: Not on file    REVIEW OF SYSTEMS: Constitutional: Weakness, dizziness ENT:  No nose bleeds.  Has few teeth and the remaining teeth are in poor repair.  His dentures got lost earlier this year during one of his hospitalizations. Pulm: Feels short of breath.  Nonproductive cough. CV:  No palpitations, no LE edema.  GU: Noticed that his urine was very dark, reddish colored  as of yesterday. GI: See HPI. Heme: Bruises somewhat easily but other than the GI bleeding, does not have excessive bleeding Transfusions: Receiving blood now. Neuro:  No headaches, no peripheral tingling or numbness Derm:  No itching, no rash or sores.  Endocrine:  No sweats or chills.  No polyuria or dysuria Immunization: Not queried. Travel:  None beyond local counties in last few months.    PHYSICAL EXAM: Vital signs in last 24 hours: Vitals:   10/23/19 0800 10/23/19 0814  BP: (!) 83/60 (!) 87/65  Pulse: (!) 118 (!) 127  Resp: (!) 22 (!) 28  Temp:  98.8 F (37.1 C)  SpO2: 99%    Wt Readings from Last 3 Encounters:  10/22/19 72.6 kg  04/18/19 78.9 kg  04/05/19 78.6 kg    General: Disheveled, somewhat cachectic, chronically ill and acutely ill looking WM. Head: No signs of head trauma.  No facial asymmetry Eyes: Slight scleral icterus.  Conjunctiva pink.  EOMI Ears: Not hard of hearing Nose: No discharge or congestion Mouth: Very poor dentition.  Only front incisors remain on the lower jaw.  These are in poor repair.  Mucosa is moist, pink, clear.  Tongue midline.  No blood in the mouth. Neck: No JVD, no masses, no thyromegaly. Lungs: Clear bilaterally.  Somewhat loose cough periodically.  No dyspnea. Heart: Tachycardia, regular.  No MRG Abdomen: Soft.  Slightly protuberant mild tenderness in the upper to mid abdomen.  No HSM, masses, bruits, hernias.  Bowel sounds hypoactive..   Rectal: Did not perform but advised by ED staff that there was blood per rectum on DRE and he has been passing medium red blood during his stay in the ED. GU: No scrotal edema.  Urine in the bedside urinal is deeply red. Musc/Skeltl: No joint swelling or gross deformity. Extremities: No CCE.  Limbs are a bit thin. Neurologic: Alert.  Oriented x3.  No asterixis.  Appropriate. Skin: No significant bruising, rash or sores. Tattoos: Multiple. Nodes: No cervical adenopathy. Psych: Cooperative,  calm.  Intake/Output from previous day: No intake/output data recorded. Intake/Output this shift: Total I/O  In: 100 [IV Piggyback:100] Out: -   LAB RESULTS: Recent Labs    10/22/19 1915 10/23/19 0452  WBC 8.6 9.1  HGB 10.9* 8.4*  HCT 30.2* 23.6*  PLT 71* 59*   BMET Lab Results  Component Value Date   NA 127 (L) 10/23/2019   NA 122 (L) 10/22/2019   NA 139 04/18/2019   K 4.0 10/23/2019   K 4.6 10/22/2019   K 4.3 04/18/2019   CL 94 (L) 10/23/2019   CL 87 (L) 10/22/2019   CL 102 04/18/2019   CO2 17 (L) 10/23/2019   CO2 18 (L) 10/22/2019   CO2 20 04/18/2019   GLUCOSE 57 (L) 10/23/2019   GLUCOSE 83 10/22/2019   GLUCOSE 93 04/18/2019   BUN 17 10/23/2019   BUN 14 10/22/2019   BUN 3 (L) 04/18/2019   CREATININE 0.67 10/23/2019   CREATININE 0.79 10/22/2019   CREATININE 0.70 (L) 04/18/2019   CALCIUM 7.2 (L) 10/23/2019   CALCIUM 7.5 (L) 10/22/2019   CALCIUM 8.8 04/18/2019   LFT Recent Labs    10/22/19 1915 10/23/19 0452  PROT 6.8 5.3*  ALBUMIN 2.5* 1.9*  AST 195* 141*  ALT 60* 43  ALKPHOS 191* 140*  BILITOT 4.5* 3.4*   PT/INR Lab Results  Component Value Date   INR 2.0 (H) 10/23/2019   Hepatitis Panel No results for input(s): HEPBSAG, HCVAB, HEPAIGM, HEPBIGM in the last 72 hours. C-Diff No components found for: CDIFF Lipase     Component Value Date/Time   LIPASE 35 03/24/2019 0950    Drugs of Abuse     Component Value Date/Time   LABOPIA NONE DETECTED 10/23/2019 0650   COCAINSCRNUR NONE DETECTED 10/23/2019 0650   LABBENZ NONE DETECTED 10/23/2019 0650   AMPHETMU NONE DETECTED 10/23/2019 0650   THCU NONE DETECTED 10/23/2019 0650   LABBARB NONE DETECTED 10/23/2019 0650     RADIOLOGY STUDIES: US Abdomen Limited  Result Date: 10/23/2019 CLINICAL DATA:  59 year old male with abdominal pain. EXAM: ULTRASOUND ABDOMEN LIMITED RIGHT UPPER QUADRANT COMPARISON:  CT abdomen pelvis dated 03/23/2019. FINDINGS: Gallbladder: Cholecystectomy. Common bile duct:  Diameter: 3 mm Liver: There is diffuse increased liver echogenicity most commonly seen in the setting of fatty infiltration. Superimposed inflammation or fibrosis is not excluded. Clinical correlation is recommended. Portal vein is patent on color Doppler imaging with normal direction of blood flow towards the liver. Other: Small perihepatic free fluid noted. IMPRESSION: 1. Cholecystectomy. 2. Fatty liver. 3. Small perihepatic ascites. Electronically Signed   By: Anner Crete M.D.   On: 10/23/2019 01:14   DG CHEST PORT 1 VIEW  Result Date: 10/23/2019 CLINICAL DATA:  Hematemesis and weakness EXAM: PORTABLE CHEST 1 VIEW COMPARISON:  CT 09/17/2019, radiograph 09/16/2019 FINDINGS: Low lung volumes with more reticulonodular opacities throughout the left lung and some more focal consolidative opacity towards the apex. No pneumothorax or visible effusion. Cardiomediastinal contours are similar to prior counting for differences in technique. No acute osseous or soft tissue abnormality. Support devices overlie the chest. IMPRESSION: 1. Low lung volumes with more reticulonodular opacities throughout the left lung and some more focal consolidative opacity towards the apex. Findings could reflect an acute infectious or inflammatory process. Electronically Signed   By: Lovena Le M.D.   On: 10/23/2019 03:36     IMPRESSION:   *  GI bleed Hx intermittent abdominal pain and N/V w extensive workup.   Known mild esoph varices, PHG, possible early gastric varices on his latest (3rd for 2020) EGD 07/2019, hemorrhoids on  colonoscopy 06/2019 PPI and octreotide gtt, Rocephin   *   Blood loss anemia.  2 PRBCs ordered.    *   Thrombocytopenia.    *   Coagulopathy.  FFP x 1 transfused.  VVit K 5 mg IV ordered  *   Alcoholic hepatitis.  Alcohol abuse ongoing Hep B, C negative, Hep A IgG positive, Hep A IgM negative in 05/2019.    *    Chronic thrombocytopenia  *     Hyponatremia  *   Alcoholism, alcohol abuse.   Chronic.  Not in remission.   *   Cystic lesion on tail of pancreas, favoring pseudocyst.  MRI/MRCP suggested as fup in 03/2020.    *   Cholecystectomy 03/2019.      PLAN:     *   EGD tmrw.   [place] for clears if tolerated.  Continue the drips of Protonix, octreotide.  Serial CBCs.  Check INR in the morning.   Azucena Freed  10/23/2019, 8:28 AM Phone [phone]  GI ATTENDING  History, laboratories, x-rays, prior endoscopy reports reviewed.  Patient seen and examined.  Agree with comprehensive consultation note as outlined above.  Chronic alcoholic with alcoholic liver disease.  Recurrent problems with nausea, vomiting, and abdominal pain secondary to acute alcoholic hepatitis.  He continues to drink, despite this.  Possible causes for GI bleeding include ulcer, esophagitis, Mallory-Weiss tear.  Variceal bleeding possible, though unlikely if the 2 recent upper endoscopy reports are accurate.  However, I do recommend IV octreotide, IV PPI, antiemetics, and alcohol withdrawal protocol.  We are planning upper endoscopy tomorrow.  Okay for teary liquids.The nature of the procedure, as well as the risks, benefits, and alternatives were carefully and thoroughly reviewed with the patient. Ample time for discussion and questions allowed. The patient understood, was satisfied, and agreed to proceed.  The patient is high risk given his liver disease and alcoholism.  We will perform the procedure with monitored anesthesia care.  Docia Chuck. Geri Seminole., M.D. [facility] of Gastroenterology

## 2019-10-23 NOTE — H&P (Addendum)
History and Physical    Tom Bender QPY:[id number] DOB: 09-15-1961 DOA: 10/22/2019  PCP: Patient, No Pcp Per  Patient coming from: Home  I have personally briefly reviewed patient's old medical records in [facility]  Chief Complaint: GIB  HPI: Tom Bender is a 59 y.o. male with medical history significant of EtOH abuse that is ongoing, cirrhosis and varices, DTs.  Patient presents to the ED with c/o hematochezia.  He states that he has been having generalized abdominal pain since having gallbladder surgery last June.  For the last 2-3 weeks, he has been having rectal bleeding.  Stool is initially red and is now black.  He has also had nausea and vomiting without any blood in the emesis.  He feels generally weak and has been falling.  He continues to consume alcohol and admits to drinking 40 ounces of beer today.  He usually drinks 40-80 ounces of beer a day.  He denies other drug use.  He denies exposure to COVID-19.  On further review.  Looks like patient was admitted in Oct and again in Nov at [facility] with reports of GIB.  EGD in Oct just showed small non-bleeding varicies, no GAVE.  Colonoscopy in Aug with polypectomy done after c/o rectal bleeding then as well and bleeding thought to be due to internal hemorrhoids.  In Nov he was admitted for reported bleeding and initial S.Tachycardia.  Went into withdrawals the next day.  Was hemoccult positive then, though HGB remained stable so they didn't scope him.  Was found to have bacterial PNA during that admit.   ED Course: Heme positive, HGB is 10.9 today, which is actually up slightly from 1 month ago at [facility] (discharged at 10.7).  He is very tachycardic however with HR as high as 160s at times, sinus.Marland Kitchen  BAL 141.  Given 1L LR bolus in ED  Platelets 71.  Was running in the 70s during hospital stay in Nov.  AST 195, ALT 60, T.Bili 4.5, ammonia 41.  Not quite as bad as it was initially in Nov.   Review of Systems: As per HPI,  otherwise all review of systems negative.  Past Medical History:  Diagnosis Date  . ETOH abuse   . Hypertension   . Liver disease   . Renal insufficiency     Past Surgical History:  Procedure Laterality Date  . BIOPSY  03/28/2019   Procedure: BIOPSY;  Surgeon: Willis Modena, MD;  Location: [facility] ENDOSCOPY;  Service: Endoscopy;;  . CHOLECYSTECTOMY N/A 03/29/2019   Procedure: LAPAROSCOPIC CHOLECYSTECTOMY WITH INTRAOPERATIVE CHOLANGIOGRAM;  Surgeon: Manus Rudd, MD;  Location: [facility] OR;  Service: General;  Laterality: N/A;  . ESOPHAGOGASTRODUODENOSCOPY (EGD) WITH PROPOFOL Left 03/28/2019   Procedure: ESOPHAGOGASTRODUODENOSCOPY (EGD) WITH PROPOFOL;  Surgeon: Willis Modena, MD;  Location: [facility] ENDOSCOPY;  Service: Endoscopy;  Laterality: Left;     reports that he has been smoking. He has been smoking about 1.50 packs per day. He has never used smokeless tobacco. He reports current alcohol use of about 12.0 standard drinks of alcohol per week. He reports previous drug use.  No Known Allergies  No family history on file. No reported sick contacts.  Prior to Admission medications   Medication Sig Start Date End Date Taking? Authorizing Provider  pantoprazole (PROTONIX) 40 MG tablet Take 1 tablet (40 mg total) by mouth daily. 04/06/19   Narda Bonds, MD    Physical Exam: Vitals:   10/23/19 0030 10/23/19 0131 10/23/19 0145 10/23/19 0226  BP: 100/76  Pulse: (!) 106 (!) 118 (!) 121 (!) 125  Resp: 17     Temp:      TempSrc:      SpO2: 100% 100% 100% 100%  Weight:      Height:        Constitutional: Nauseated and dry heaving. Eyes: PERRL, lids and conjunctivae normal ENMT: Mucous membranes are moist. Posterior pharynx clear of any exudate or lesions.Normal dentition.  Neck: normal, supple, no masses, no thyromegaly Respiratory: clear to auscultation bilaterally, no wheezing, no crackles. Normal respiratory effort. No accessory muscle use.  Cardiovascular: Tachycardic Abdomen: no  tenderness, no masses palpated. No hepatosplenomegaly. Bowel sounds positive.  Musculoskeletal: no clubbing / cyanosis. No joint deformity upper and lower extremities. Good ROM, no contractures. Normal muscle tone.  Skin: no rashes, lesions, ulcers. No induration Neurologic: CN 2-12 grossly intact. Sensation intact, DTR normal. Strength 5/5 in all 4.  Psychiatric: Normal judgment and insight. Alert and oriented x 3. Normal mood.    Labs on Admission: I have personally reviewed following labs and imaging studies  CBC: Recent Labs  Lab 10/22/19 1915  WBC 8.6  HGB 10.9*  HCT 30.2*  MCV 97.1  PLT 71*   Basic Metabolic Panel: Recent Labs  Lab 10/22/19 1915  NA 122*  K 4.6  CL 87*  CO2 18*  GLUCOSE 83  BUN 14  CREATININE 0.79  CALCIUM 7.5*   GFR: Estimated Creatinine Clearance: 103.1 mL/min (by C-G formula based on SCr of 0.79 mg/dL). Liver Function Tests: Recent Labs  Lab 10/22/19 1915  AST 195*  ALT 60*  ALKPHOS 191*  BILITOT 4.5*  PROT 6.8  ALBUMIN 2.5*   No results for input(s): LIPASE, AMYLASE in the last 168 hours. Recent Labs  Lab 10/23/19 0036  AMMONIA 41*   Coagulation Profile: No results for input(s): INR, PROTIME in the last 168 hours. Cardiac Enzymes: No results for input(s): CKTOTAL, CKMB, CKMBINDEX, TROPONINI in the last 168 hours. BNP (last 3 results) No results for input(s): PROBNP in the last 8760 hours. HbA1C: No results for input(s): HGBA1C in the last 72 hours. CBG: No results for input(s): GLUCAP in the last 168 hours. Lipid Profile: No results for input(s): CHOL, HDL, LDLCALC, TRIG, CHOLHDL, LDLDIRECT in the last 72 hours. Thyroid Function Tests: No results for input(s): TSH, T4TOTAL, FREET4, T3FREE, THYROIDAB in the last 72 hours. Anemia Panel: No results for input(s): VITAMINB12, FOLATE, FERRITIN, TIBC, IRON, RETICCTPCT in the last 72 hours. Urine analysis:    Component Value Date/Time   COLORURINE YELLOW 03/23/2019 1606    APPEARANCEUR CLEAR 03/23/2019 1606   LABSPEC 1.010 03/23/2019 1606   PHURINE 8.5 (H) 03/23/2019 1606   GLUCOSEU NEGATIVE 03/23/2019 1606   HGBUR NEGATIVE 03/23/2019 1606   BILIRUBINUR small 04/18/2019 0956   KETONESUR NEGATIVE 03/23/2019 1606   PROTEINUR Positive (A) 04/18/2019 0956   PROTEINUR NEGATIVE 03/23/2019 1606   UROBILINOGEN 0.2 04/18/2019 0956   NITRITE neg 04/18/2019 0956   NITRITE NEGATIVE 03/23/2019 1606   LEUKOCYTESUR Negative 04/18/2019 0956   LEUKOCYTESUR NEGATIVE 03/23/2019 1606    Radiological Exams on Admission: US Abdomen Limited  Result Date: 10/23/2019 CLINICAL DATA:  59 year old male with abdominal pain. EXAM: ULTRASOUND ABDOMEN LIMITED RIGHT UPPER QUADRANT COMPARISON:  CT abdomen pelvis dated 03/23/2019. FINDINGS: Gallbladder: Cholecystectomy. Common bile duct: Diameter: 3 mm Liver: There is diffuse increased liver echogenicity most commonly seen in the setting of fatty infiltration. Superimposed inflammation or fibrosis is not excluded. Clinical correlation is recommended. Portal vein is patent  on color Doppler imaging with normal direction of blood flow towards the liver. Other: Small perihepatic free fluid noted. IMPRESSION: 1. Cholecystectomy. 2. Fatty liver. 3. Small perihepatic ascites. Electronically Signed   By: Elgie Collard M.D.   On: 10/23/2019 01:14    EKG: Independently reviewed.  Assessment/Plan Principal Problem:   GI bleed Active Problems:   Hyponatremia   Benign essential HTN   Alcohol abuse   Alcoholic hepatitis   Sinus tachycardia    1. GIB - 1. Protonix gtt 2. Tele monitor 3. Repeat CBC in AM 4. Clear liquid diet 5. Monitor for large volume bleed 6. Call GI in AM 7. See HPI for details on history 2. S.Tach - 1. Dehydration and hyponatremia related possibly? Or early withdrawals? Or GIB could be worse than initially thought.  Or could have PNA again like in nov 2. IVF: 1L LR bolus in ED + 125 cc/hr banana bag 3. Check  CXR 4. Tele monitor 5. Repeat CBC in AM 3. EtOH abuse - 1. CIWA 4. HTN - 1. Holding home BP meds 5. Hyponatremia - 1. IVF as above 2. Repeat CMP in AM.  DVT prophylaxis: SCDs Code Status: Full Family Communication: No family in room Disposition Plan: Home after admit Consults called: None Admission status: Place in 67   Aleynah Rocchio M. DO [facility]  How to contact the [facility] Attending or Consulting provider 7A - 7P or covering provider during after hours 7P -7A, for this patient?  1. Check the care team in [facility] and look for a) attending/consulting TRH provider listed and b) the [facility] team listed 2. Log into [email or web address]  Amion Physician Scheduling and messaging for groups and whole hospitals  On call and physician scheduling software for group practices, residents, hospitalists and other medical providers for call, clinic, rotation and shift schedules. OnCall [employer] is a hospital-wide system for scheduling doctors and paging doctors on call. EasyPlot is for scientific plotting and data analysis.  [email or web address]  and use [facility]'s universal password to access. If you do not have the password, please contact the hospital operator.  3. Locate the [facility] provider you are looking for under [facility] and page to a number that you can be directly reached. 4. If you still have difficulty reaching the provider, please page the [facility] (Director on Call) for the Hospitalists listed on amion for assistance.  10/23/2019, 3:11 AM

## 2019-10-23 NOTE — Progress Notes (Signed)
Progress Note    Tom Bender  GYF:[id number] DOB: [birth year]  DOA: 10/22/2019 PCP: Patient, No Pcp Per    Brief Narrative:     Medical records reviewed and are as summarized below:  Tom Bender is an 59 y.o. male with medical history significant of EtOH abuse that is ongoing, cirrhosis and varices, DTs.  Patient presents to the ED with c/o hematochezia.  He states that he has been having generalized abdominal pain since having gallbladder surgery last June. For the last 2-3 weeks, he has been having rectal bleeding. Stool is initially red and is now black. He has also had nausea and vomiting without any blood in the emesis. He feels generally weak and has been falling. He continues to consume alcohol and admits to drinking 40 ounces of beer today. He usually drinks 40-80 ounces of beer a day.    Assessment/Plan:   Principal Problem:   GI bleed Active Problems:   Hyponatremia   Benign essential HTN   Alcohol abuse   Alcoholic hepatitis   Sinus tachycardia   Upper GI bleed-swift -hgb 8 this AM but had episodes of vomiting blood since so suspect it will be much lower shortly -transfuse 2 units PRBC -on protonix/sandostatin -his INR is 2: will give vitamin K and FFP -GI to see patient -PCCM consult as I suspect patient will need to be intubated for airway protection and EGD -monitor h/h  alcoholic -CIWA  Hypomagnesemia -replete  Sinus tachy -due to bleeding +/- alcohol withdrawal   Family Communication/Anticipated D/C date and plan/Code Status   DVT prophylaxis: scd Code Status: Full Code.  Family Communication:  Disposition Plan: suspect will need ICU level care and intubation for airway protection if UGI is to be done   Medical Consultants:    GI  PCCM    Objective:    Vitals:   10/23/19 0702 10/23/19 0703 10/23/19 0704 10/23/19 0705  BP:    (!) 86/57  Pulse:  (!) 124 (!) 145 (!) 31  Resp: (!) 21 (!) 22 (!) 24 (!) 22  Temp:        TempSrc:      SpO2:  (!) 81% (!) 78% (!) 62%  Weight:      Height:        Intake/Output Summary (Last 24 hours) at 10/23/2019 0748 Last data filed at 10/23/2019 0729 Gross per 24 hour  Intake 100 ml  Output --  Net 100 ml   Filed Weights   10/22/19 1837  Weight: 72.6 kg    Abdomen hard and distended Currently compensated but suspect will de-compensate quickly  Data Reviewed:   I have personally reviewed following labs and imaging studies:  Labs: Labs show the following:   Basic Metabolic Panel: Recent Labs  Lab 10/22/19 1915 10/23/19 0452 10/23/19 0546  NA 122* 127*  --   K 4.6 4.0  --   CL 87* 94*  --   CO2 18* 17*  --   GLUCOSE 83 57*  --   BUN 14 17  --   CREATININE 0.79 0.67  --   CALCIUM 7.5* 7.2*  --   MG  --   --  1.4*  PHOS  --   --  2.3*   GFR Estimated Creatinine Clearance: 103.1 mL/min (by C-G formula based on SCr of 0.67 mg/dL). Liver Function Tests: Recent Labs  Lab 10/22/19 1915 10/23/19 0452  AST 195* 141*  ALT 60* 43  ALKPHOS 191* 140*  BILITOT 4.5*  3.4*  PROT 6.8 5.3*  ALBUMIN 2.5* 1.9*   No results for input(s): LIPASE, AMYLASE in the last 168 hours. Recent Labs  Lab 10/23/19 0036  AMMONIA 41*   Coagulation profile Recent Labs  Lab 10/23/19 0452  INR 2.0*    CBC: Recent Labs  Lab 10/22/19 1915 10/23/19 0452  WBC 8.6 9.1  HGB 10.9* 8.4*  HCT 30.2* 23.6*  MCV 97.1 99.6  PLT 71* 59*   Cardiac Enzymes: No results for input(s): CKTOTAL, CKMB, CKMBINDEX, TROPONINI in the last 168 hours. BNP (last 3 results) No results for input(s): PROBNP in the last 8760 hours. CBG: No results for input(s): GLUCAP in the last 168 hours. D-Dimer: No results for input(s): DDIMER in the last 72 hours. Hgb A1c: No results for input(s): HGBA1C in the last 72 hours. Lipid Profile: No results for input(s): CHOL, HDL, LDLCALC, TRIG, CHOLHDL, LDLDIRECT in the last 72 hours. Thyroid function studies: No results for input(s): TSH,  T4TOTAL, T3FREE, THYROIDAB in the last 72 hours.  Invalid input(s): FREET3 Anemia work up: No results for input(s): VITAMINB12, FOLATE, FERRITIN, TIBC, IRON, RETICCTPCT in the last 72 hours. Sepsis Labs: Recent Labs  Lab 10/22/19 1915 10/23/19 0452  WBC 8.6 9.1    Microbiology No results found for this or any previous visit (from the past 240 hour(s)).  Procedures and diagnostic studies:  US Abdomen Limited  Result Date: 10/23/2019 CLINICAL DATA:  59 year old male with abdominal pain. EXAM: ULTRASOUND ABDOMEN LIMITED RIGHT UPPER QUADRANT COMPARISON:  CT abdomen pelvis dated 03/23/2019. FINDINGS: Gallbladder: Cholecystectomy. Common bile duct: Diameter: 3 mm Liver: There is diffuse increased liver echogenicity most commonly seen in the setting of fatty infiltration. Superimposed inflammation or fibrosis is not excluded. Clinical correlation is recommended. Portal vein is patent on color Doppler imaging with normal direction of blood flow towards the liver. Other: Small perihepatic free fluid noted. IMPRESSION: 1. Cholecystectomy. 2. Fatty liver. 3. Small perihepatic ascites. Electronically Signed   By: Anner Crete M.D.   On: 10/23/2019 01:14   DG CHEST PORT 1 VIEW  Result Date: 10/23/2019 CLINICAL DATA:  Hematemesis and weakness EXAM: PORTABLE CHEST 1 VIEW COMPARISON:  CT 09/17/2019, radiograph 09/16/2019 FINDINGS: Low lung volumes with more reticulonodular opacities throughout the left lung and some more focal consolidative opacity towards the apex. No pneumothorax or visible effusion. Cardiomediastinal contours are similar to prior counting for differences in technique. No acute osseous or soft tissue abnormality. Support devices overlie the chest. IMPRESSION: 1. Low lung volumes with more reticulonodular opacities throughout the left lung and some more focal consolidative opacity towards the apex. Findings could reflect an acute infectious or inflammatory process. Electronically Signed    By: Lovena Le M.D.   On: 10/23/2019 03:36    Medications:   . sodium chloride   Intravenous Once  . sodium chloride   Intravenous Once  . folic acid  1 mg Oral Daily  . multivitamin with minerals  1 tablet Oral Daily  . [START ON 10/26/2019] pantoprazole  40 mg Intravenous Q12H  . thiamine  100 mg Oral Daily   Or  . thiamine  100 mg Intravenous Daily   Continuous Infusions: . cefTRIAXone (ROCEPHIN)  IV Stopped (10/23/19 0729)  . magnesium sulfate bolus IVPB    . octreotide  (SANDOSTATIN)    IV infusion 50 mcg/hr (10/23/19 0727)  . pantoprozole (PROTONIX) infusion 8 mg/hr (10/23/19 0428)  . phytonadione (VITAMIN K) IV       LOS: 0 days  Joseph Art  [facility]   How to contact the [facility] Attending or Consulting provider 7A - 7P or covering provider during after hours 7P -7A, for this patient?  1. Check the care team in [facility] and look for a) attending/consulting TRH provider listed and b) the [facility] team listed 2. Log into [email or web address] and use [place]'s universal password to access. If you do not have the password, please contact the hospital operator. 3. Locate the [facility] provider you are looking for under [facility] and page to a number that you can be directly reached. 4. If you still have difficulty reaching the provider, please page the [facility] (Director on Call) for the Hospitalists listed on amion for assistance.  10/23/2019, 7:48 AM

## 2019-10-23 NOTE — ED Notes (Signed)
Admitting MD came to bedside and assessed pt

## 2019-10-23 NOTE — ED Provider Notes (Signed)
[facility] EMERGENCY DEPARTMENT Provider Note   CSN: [id number] Arrival date & time: 10/22/19  1807   History Chief Complaint  Patient presents with  . GI Bleeding    Tom Bender is a 59 y.o. male.  The history is provided by the patient.  He has history of hypertension, alcohol abuse with liver disease and comes in because of rectal bleeding.  He states that he has been having generalized abdominal pain since having gallbladder surgery last June.  For the last 2-3 weeks, he has been having rectal bleeding.  Stool is initially red and is now black.  He has also had nausea and vomiting without any blood in the emesis.  He feels generally weak and has been falling.  He continues to consume alcohol and admits to drinking 40 ounces of beer today.  He usually drinks 40-80 ounces of beer a day.  He denies other drug use.  He denies exposure to COVID-19.  Past Medical History:  Diagnosis Date  . ETOH abuse   . Hypertension   . Liver disease   . Renal insufficiency     Patient Active Problem List   Diagnosis Date Noted  . SIRS (systemic inflammatory response syndrome) ([place]) 03/23/2019  . Hyponatremia 03/23/2019  . Hypokalemia 03/23/2019  . Benign essential HTN 03/23/2019  . Alcohol abuse 03/23/2019  . Lactic acidosis 03/23/2019  . Pancreatic cyst 03/23/2019    Past Surgical History:  Procedure Laterality Date  . BIOPSY  03/28/2019   Procedure: BIOPSY;  Surgeon: Arta Silence, MD;  Location: [facility];  Service: Endoscopy;;  . CHOLECYSTECTOMY N/A 03/29/2019   Procedure: LAPAROSCOPIC CHOLECYSTECTOMY WITH INTRAOPERATIVE CHOLANGIOGRAM;  Surgeon: Donnie Mesa, MD;  Location: [facility];  Service: General;  Laterality: N/A;  . ESOPHAGOGASTRODUODENOSCOPY (EGD) WITH PROPOFOL Left 03/28/2019   Procedure: ESOPHAGOGASTRODUODENOSCOPY (EGD) WITH PROPOFOL;  Surgeon: Arta Silence, MD;  Location: [facility] ENDOSCOPY;  Service: Endoscopy;  Laterality: Left;       No family  history on file.  Social History   Tobacco Use  . Smoking status: Current Every Day Smoker    Packs/day: 1.50  . Smokeless tobacco: Never Used  Substance Use Topics  . Alcohol use: Yes    Alcohol/week: 12.0 standard drinks    Types: 12 Cans of beer per week  . Drug use: Not Currently    Home Medications Prior to Admission medications   Medication Sig Start Date End Date Taking? Authorizing Provider  pantoprazole (PROTONIX) 40 MG tablet Take 1 tablet (40 mg total) by mouth daily. 04/06/19   Mariel Aloe, MD    Allergies    Patient has no known allergies.  Review of Systems   Review of Systems  All other systems reviewed and are negative.   Physical Exam Updated Vital Signs BP 115/82 (BP Location: Right Arm)   Pulse (!) 116   Temp 98.1 F (36.7 C) (Oral)   Resp 18   Ht 5\' 9"  (1.753 m)   Wt 72.6 kg   SpO2 96%   BMI 23.63 kg/m   Physical Exam Vitals and nursing note reviewed.   Unkempt and slightly cachectic 59 year old male, resting comfortably and in no acute distress. Vital signs are significant for tachycardia. Oxygen saturation is 97%, which is normal. Head is normocephalic and atraumatic. PERRLA, EOMI. Oropharynx is clear.  Sclerae are mildly icteric. Neck is nontender and supple without adenopathy or JVD. Back is nontender and there is no CVA tenderness. Lungs are clear without rales,  wheezes, or rhonchi. Chest is nontender. Heart has regular rate and rhythm without murmur. Abdomen is soft, flat, with mild tenderness diffusely.  There is no rebound or guarding.  Liver is enlarged to about 3 cm beneath the costal margin.  Peristalsis is normoactive. Rectal: Decreased sphincter tone.  Stool is dark but not overtly melanotic. Extremities have no cyanosis or edema, full range of motion is present. Skin is warm and dry without rash. Neurologic: Mental status is normal, cranial nerves are intact, there are no motor or sensory deficits.  ED Results / Procedures  / Treatments   Labs (all labs ordered are listed, but only abnormal results are displayed) Labs Reviewed  COMPREHENSIVE METABOLIC PANEL - Abnormal; Notable for the following components:      Result Value   Sodium 122 (*)    Chloride 87 (*)    CO2 18 (*)    Calcium 7.5 (*)    Albumin 2.5 (*)    AST 195 (*)    ALT 60 (*)    Alkaline Phosphatase 191 (*)    Total Bilirubin 4.5 (*)    Anion gap 17 (*)    All other components within normal limits  CBC - Abnormal; Notable for the following components:   RBC 3.11 (*)    Hemoglobin 10.9 (*)    HCT 30.2 (*)    MCH 35.0 (*)    MCHC 36.1 (*)    RDW 16.9 (*)    Platelets 71 (*)    All other components within normal limits  PROTIME-INR  POC OCCULT BLOOD, ED  TYPE AND SCREEN  ABO/RH   Radiology US Abdomen Limited  Result Date: 10/23/2019 CLINICAL DATA:  59 year old male with abdominal pain. EXAM: ULTRASOUND ABDOMEN LIMITED RIGHT UPPER QUADRANT COMPARISON:  CT abdomen pelvis dated 03/23/2019. FINDINGS: Gallbladder: Cholecystectomy. Common bile duct: Diameter: 3 mm Liver: There is diffuse increased liver echogenicity most commonly seen in the setting of fatty infiltration. Superimposed inflammation or fibrosis is not excluded. Clinical correlation is recommended. Portal vein is patent on color Doppler imaging with normal direction of blood flow towards the liver. Other: Small perihepatic free fluid noted. IMPRESSION: 1. Cholecystectomy. 2. Fatty liver. 3. Small perihepatic ascites. Electronically Signed   By: Elgie Collard M.D.   On: 10/23/2019 01:14    Procedures Procedures  CRITICAL CARE Performed by: Dione Booze Total critical care time: 40 minutes Critical care time was exclusive of separately billable procedures and treating other patients. Critical care was necessary to treat or prevent imminent or life-threatening deterioration. Critical care was time spent personally by me on the following activities: development of treatment  plan with patient and/or surrogate as well as nursing, discussions with consultants, evaluation of patient's response to treatment, examination of patient, obtaining history from patient or surrogate, ordering and performing treatments and interventions, ordering and review of laboratory studies, ordering and review of radiographic studies, pulse oximetry and re-evaluation of patient's condition.  Medications Ordered in ED Medications  lactated ringers bolus 1,000 mL (1,000 mLs Intravenous New Bag/Given 10/22/19 2333)    ED Course  I have reviewed the triage vital signs and the nursing notes.  Pertinent labs & imaging results that were available during my care of the patient were reviewed by me and considered in my medical decision making (see chart for details).  MDM Rules/Calculators/A&P Possible rectal bleeding and patient with alcohol abuse and alcoholic liver disease.  EMS noted initial blood pressure 76/44 which increased to 100/66 following normal saline bolus.  Old records are reviewed, and he has several hospital admissions for alcohol abuse, most recently September 15, 2019.  Laparoscopic cholecystectomy done March 28, 2019.  Stool has been sent for Hemoccult testing.  Labs today show hyponatremia with sodium 122 and borderline elevated anion gap.  All of liver enzymes are up including bilirubin 4.5 and alkaline phosphatase 191.  All of the liver enzymes are significantly higher than they were on April 18, 2019.  Hemoglobin is 10.9 which is approximately 2 g drop compared with April 29, 2019.  Thrombocytopenia is present with platelets of 71,000.  All of these lab abnormalities are consistent with alcohol abuse.  On review of care everywhere, hemoglobin was 15.0 on June 21, 2019.  Also sodium 133, alkaline phosphatase 262, total bilirubin 1.67, ALT 69, AST 135.  While his abnormal liver tests are probably from alcohol abuse, he will be sent for right upper quadrant ultrasound to rule out  choledocholithiasis.  Ultrasound shows small amount of ascites and fatty liver.  Further review of past records shows during admission to [facility] had hemoglobin 10.7 on 12/5, but sodium was 135, bilirubin 1.3, alkaline phosphatase 141, AST 95, ALT 33.  He continues to be tachycardic in spite of additional fluids.  Alcohol has come back at 141, ammonia 41.  He does not demonstrate any drop in hemoglobin from last known hemoglobin, but with persistent tachycardia, hyponatremia, new elevation in bilirubin, it is felt that he needs to be admitted for ongoing hydration and observation.  Case is discussed with Dr. Julian Reil of [facility] hospitalist, who agrees to admit the patient.  Final Clinical Impression(s) / ED Diagnoses Final diagnoses:  Gastrointestinal hemorrhage, unspecified gastrointestinal hemorrhage type  Alcohol intoxication, uncomplicated (HCC)  Sinus tachycardia  Jaundice  Elevated liver function tests  Hyponatremia  Thrombocytopenia [facility])    Rx / DC Orders ED Discharge Orders    None       Dione Booze, MD 10/23/19 0230

## 2019-10-23 NOTE — Consult Note (Signed)
PULMONARY / CRITICAL CARE MEDICINE   NAME:  Tom Bender, MRN:  [id number], DOB:  12/02/1960, LOS: 0 ADMISSION DATE:  10/22/2019, CONSULTATION DATE: 10/23/2019 REFERRING MD: [facility], CHIEF COMPLAINT: Hypotension in setting of blood loss anemia from GI bleed  BRIEF HISTORY:    59 year old alcoholic with multiple GI bleeds HISTORY OF PRESENT ILLNESS   59 year old alcoholic has had multiple admissions for GI bleed and has undergone upper and lower endoscopies.  He has known varices and erosions in the esophagus.  He presents to [facility] this time with 2 weeks of bloody diarrhea upper GI bleed with blood noted to be hypotensive and treated with FFP packed red blood cells and fluids.  Pulmonary critical care asked to assume his care at this time due to the fact he may need vasopressors and intubation.  At the time of examination is awake alert no acute distress and is being transfused. SIGNIFICANT PAST MEDICAL HISTORY   Alcohol abuse Esophageal varices Cirrhosis Tobacco abuse Degenerative joint disease  SIGNIFICANT EVENTS:  10/22/2018 mated with GI bleed STUDIES:    CULTURES:    ANTIBIOTICS:  10/22/2018 Rocephin>>  LINES/TUBES:    CONSULTANTS:  10/22/2018 GI SUBJECTIVE:  59 year old alcoholic  recurrent GI bleed to be admitted to ICU  CONSTITUTIONAL: BP 96/61   Pulse (!) 128   Temp 98.5 F (36.9 C) (Oral)   Resp (!) 26   Ht 5\' 9"  (1.753 m)   Wt 72.6 kg   SpO2 97%   BMI 23.63 kg/m   No intake/output data recorded.        PHYSICAL EXAM: General: Disheveled male no acute distress at time of examination Neuro: Appears intact answers questions appropriately HEENT: Noted to be bearded with old blood in beard no JVD lymphadenopathy is appreciated Cardiovascular: Heart sounds are regular regular rate rhythm sinus tach 104 Lungs: Essentially clear to auscultation Abdomen: Mildly tender faint bowel sounds Musculoskeletal: Grossly intact Skin: Multiple areas of ecchymosis  and abrasions  RESOLVED PROBLEM LIST   ASSESSMENT AND PLAN   Blood loss anemia with shock most likely secondary from blood loss.  In the setting of GI bleed Admit to the intensive care unit Peripheral vasopressors as needed 2 unit packed cells in transfusion FFP for elevated INR Continue to monitor for bleeding GI consult No need for intubation at this time.  Recurrent GI bleed in the setting of alcohol abuse Octreotide drip Protonix drip GI consult has been called and they are involved. Note multiple upper and lower endoscopies in the past   Chronic alcohol abuse Monitor for withdrawal CIWA protocol Thiamine and folic acid Consideration to alcohol stoppage  Electrolyte disturbance hyponatremia Most likely secondary to alcohol abuse  Monitor electrolytes replete as needed  Elevated liver enzymes Continue to monitor most likely secondary to alcohol abuse  SUMMARY OF TODAY'S PLAN:  59 year old male history of heavy alcohol abuse with frequent admissions for GI bleed with known varices and ulcers has had upper and lower endoscopies.  He presents again with upper GI bleed were reported to be massive with hemoglobin down to 8.0 INR 2.8.  Being treated with transfusion vitamin K FFP pulmonary critical care asked to assume his care and admit to the hospital.  Best Practice / Goals of Care / Disposition.   DVT PROPHYLAXIS: Sequential hose SUP: PPI drip NUTRITION: N.p.o. MOBILITY: Bedrest GOALS OF CARE: Full code FAMILY DISCUSSIONS: Patient updated at bedside DISPOSITION admit to intensive care unit  LABS  Glucose No results for input(s): GLUCAP in  the last 168 hours.  BMET Recent Labs  Lab 10/22/19 1915 10/23/19 0452  NA 122* 127*  K 4.6 4.0  CL 87* 94*  CO2 18* 17*  BUN 14 17  CREATININE 0.79 0.67  GLUCOSE 83 57*    Liver Enzymes Recent Labs  Lab 10/22/19 1915 10/23/19 0452  AST 195* 141*  ALT 60* 43  ALKPHOS 191* 140*  BILITOT 4.5* 3.4*  ALBUMIN  2.5* 1.9*    Electrolytes Recent Labs  Lab 10/22/19 1915 10/23/19 0452 10/23/19 0546  CALCIUM 7.5* 7.2*  --   MG  --   --  1.4*  PHOS  --   --  2.3*    CBC Recent Labs  Lab 10/22/19 1915 10/23/19 0452  WBC 8.6 9.1  HGB 10.9* 8.4*  HCT 30.2* 23.6*  PLT 71* 59*    ABG No results for input(s): PHART, PCO2ART, PO2ART in the last 168 hours.  Coag's Recent Labs  Lab 10/23/19 0452  INR 2.0*    Sepsis Markers Recent Labs  Lab 10/23/19 0540  LATICACIDVEN 3.2*    Cardiac Enzymes No results for input(s): TROPONINI, PROBNP in the last 168 hours.  PAST MEDICAL HISTORY :   He  has a past medical history of ETOH abuse, Hypertension, Liver disease, and Renal insufficiency.  PAST SURGICAL HISTORY:  He  has a past surgical history that includes Esophagogastroduodenoscopy (egd) with propofol (Left, 03/28/2019); biopsy (03/28/2019); and Cholecystectomy (N/A, 03/29/2019).  No Known Allergies  No current facility-administered medications on file prior to encounter.   Current Outpatient Medications on File Prior to Encounter  Medication Sig  . Multiple Vitamins-Minerals (MULTIVITAMIN WITH MINERALS) tablet Take 1 tablet by mouth daily.  . pantoprazole (PROTONIX) 40 MG tablet Take 1 tablet (40 mg total) by mouth daily.    FAMILY HISTORY:   His family history is not on file.  SOCIAL HISTORY:  He  reports that he has been smoking. He has been smoking about 1.50 packs per day. He has never used smokeless tobacco. He reports current alcohol use of about 12.0 standard drinks of alcohol per week. He reports previous drug use.  REVIEW OF SYSTEMS:    10 point review of system taken, please see HPI for positives and negatives.   App cct 45 min  Brett Canales Geovani Tootle ACNP Acute Care Nurse Practitioner [facility] Pulmonary/Critical Care Please consult Amion 10/23/2019, 9:06 AM

## 2019-10-23 NOTE — Progress Notes (Signed)
When patient arrived on the floor, a skin assessment was performed by Shauna Hugh, RN and myself.   Patient skin located around the buttock, anus, and sacrum were red, raw, and extremely painful to the touch. The wound located on the sacrum was bleeding and looked much like an abrasion.   Patient also had an abrasion on his nose that was scabbed over.

## 2019-10-23 NOTE — ED Notes (Signed)
Admitting MD paged again after pt's constant hypotension. Awaiting orders

## 2019-10-23 NOTE — Progress Notes (Signed)
Pt seen at bedside at 7am due to worsening hypotension with BPs of 60s/50s.  Tachycardic to 130-140, s.tach.  This believed to be due to fast upper GI bleed given patient HPI.  Only the single episode of vomiting up "frank red blood" earlier this morning overnight per RN.  HGB down from 10.9 to 8.4 after 2.5L of IVF.  Getting another 1L bolus now for hypotension (will be 3.5L of IVF).  Also ordered 2u PRBC transfusion.  INR just came back at 2.0, called and spoke with Dr. Benjamine Mola in physician to physician sign out and let her know about the patient, INR, and instability.  She will take over as day time attending and call PCCM / update GI if needed.  Did speak with Dr. Russella Dar around 0430 about patient as noted in prior PN.

## 2019-10-24 ENCOUNTER — Inpatient Hospital Stay (HOSPITAL_COMMUNITY): Payer: Self-pay | Admitting: Anesthesiology

## 2019-10-24 ENCOUNTER — Encounter (HOSPITAL_COMMUNITY): Payer: Self-pay | Admitting: Internal Medicine

## 2019-10-24 ENCOUNTER — Encounter (HOSPITAL_COMMUNITY)
Admission: EM | Disposition: A | Discharge status: Home / Self Care | Payer: Self-pay | Source: Home / Self Care | Attending: Internal Medicine

## 2019-10-24 ENCOUNTER — Inpatient Hospital Stay (HOSPITAL_COMMUNITY): Payer: Self-pay

## 2019-10-24 DIAGNOSIS — F10920 Alcohol use, unspecified with intoxication, uncomplicated: Secondary | ICD-10-CM

## 2019-10-24 DIAGNOSIS — I8510 Secondary esophageal varices without bleeding: Secondary | ICD-10-CM

## 2019-10-24 DIAGNOSIS — K922 Gastrointestinal hemorrhage, unspecified: Secondary | ICD-10-CM

## 2019-10-24 DIAGNOSIS — K226 Gastro-esophageal laceration-hemorrhage syndrome: Principal | ICD-10-CM

## 2019-10-24 DIAGNOSIS — L8990 Pressure ulcer of unspecified site, unspecified stage: Secondary | ICD-10-CM | POA: Insufficient documentation

## 2019-10-24 HISTORY — PX: ESOPHAGOGASTRODUODENOSCOPY (EGD) WITH PROPOFOL: SHX5813

## 2019-10-24 LAB — DIFFERENTIAL
Abs Immature Granulocytes: 0.05 10*3/uL (ref 0.00–0.07)
Basophils Absolute: 0 10*3/uL (ref 0.0–0.1)
Basophils Relative: 0 %
Eosinophils Absolute: 0 10*3/uL (ref 0.0–0.5)
Eosinophils Relative: 0 %
Immature Granulocytes: 1 %
Lymphocytes Relative: 23 %
Lymphs Abs: 1.7 10*3/uL (ref 0.7–4.0)
Monocytes Absolute: 0.8 10*3/uL (ref 0.1–1.0)
Monocytes Relative: 10 %
Neutro Abs: 4.9 10*3/uL (ref 1.7–7.7)
Neutrophils Relative %: 66 %

## 2019-10-24 LAB — CBC WITH DIFFERENTIAL/PLATELET
Abs Immature Granulocytes: 0.02 10*3/uL (ref 0.00–0.07)
Abs Immature Granulocytes: 0.03 10*3/uL (ref 0.00–0.07)
Abs Immature Granulocytes: 0.03 10*3/uL (ref 0.00–0.07)
Basophils Absolute: 0 10*3/uL (ref 0.0–0.1)
Basophils Absolute: 0 10*3/uL (ref 0.0–0.1)
Basophils Absolute: 0 10*3/uL (ref 0.0–0.1)
Basophils Relative: 0 %
Basophils Relative: 0 %
Basophils Relative: 0 %
Eosinophils Absolute: 0.1 10*3/uL (ref 0.0–0.5)
Eosinophils Absolute: 0.1 10*3/uL (ref 0.0–0.5)
Eosinophils Absolute: 0.1 10*3/uL (ref 0.0–0.5)
Eosinophils Relative: 1 %
Eosinophils Relative: 2 %
Eosinophils Relative: 1 %
HCT: 27.5 % — ABNORMAL LOW (ref 39.0–52.0)
HCT: 27.8 % — ABNORMAL LOW (ref 39.0–52.0)
HCT: 29.3 % — ABNORMAL LOW (ref 39.0–52.0)
Hemoglobin: 9.4 g/dL — ABNORMAL LOW (ref 13.0–17.0)
Hemoglobin: 9.6 g/dL — ABNORMAL LOW (ref 13.0–17.0)
Hemoglobin: 10.3 g/dL — ABNORMAL LOW (ref 13.0–17.0)
Immature Granulocytes: 0 %
Immature Granulocytes: 1 %
Immature Granulocytes: 1 %
Lymphocytes Relative: 23 %
Lymphocytes Relative: 23 %
Lymphocytes Relative: 26 %
Lymphs Abs: 1.4 10*3/uL (ref 0.7–4.0)
Lymphs Abs: 1.2 10*3/uL (ref 0.7–4.0)
Lymphs Abs: 1.3 10*3/uL (ref 0.7–4.0)
MCH: 33.1 pg (ref 26.0–34.0)
MCH: 33.6 pg (ref 26.0–34.0)
MCH: 33.9 pg (ref 26.0–34.0)
MCHC: 34.2 g/dL (ref 30.0–36.0)
MCHC: 34.5 g/dL (ref 30.0–36.0)
MCHC: 35.2 g/dL (ref 30.0–36.0)
MCV: 96.8 fL (ref 80.0–100.0)
MCV: 97.2 fL (ref 80.0–100.0)
MCV: 96.4 fL (ref 80.0–100.0)
Monocytes Absolute: 0.6 10*3/uL (ref 0.1–1.0)
Monocytes Absolute: 0.5 10*3/uL (ref 0.1–1.0)
Monocytes Absolute: 0.5 10*3/uL (ref 0.1–1.0)
Monocytes Relative: 10 %
Monocytes Relative: 10 %
Monocytes Relative: 10 %
Neutro Abs: 4 10*3/uL (ref 1.7–7.7)
Neutro Abs: 3.3 10*3/uL (ref 1.7–7.7)
Neutro Abs: 3 10*3/uL (ref 1.7–7.7)
Neutrophils Relative %: 66 %
Neutrophils Relative %: 64 %
Neutrophils Relative %: 62 %
Platelets: 52 10*3/uL — ABNORMAL LOW (ref 150–400)
Platelets: 48 10*3/uL — ABNORMAL LOW (ref 150–400)
Platelets: 49 10*3/uL — ABNORMAL LOW (ref 150–400)
RBC: 2.84 MIL/uL — ABNORMAL LOW (ref 4.22–5.81)
RBC: 2.86 MIL/uL — ABNORMAL LOW (ref 4.22–5.81)
RBC: 3.04 MIL/uL — ABNORMAL LOW (ref 4.22–5.81)
RDW: 19.4 % — ABNORMAL HIGH (ref 11.5–15.5)
RDW: 19.6 % — ABNORMAL HIGH (ref 11.5–15.5)
RDW: 19.8 % — ABNORMAL HIGH (ref 11.5–15.5)
WBC: 6.1 10*3/uL (ref 4.0–10.5)
WBC: 5.1 10*3/uL (ref 4.0–10.5)
WBC: 4.9 10*3/uL (ref 4.0–10.5)
nRBC: 0 % (ref 0.0–0.2)
nRBC: 0 % (ref 0.0–0.2)
nRBC: 0 % (ref 0.0–0.2)

## 2019-10-24 LAB — TYPE AND SCREEN
ABO/RH(D): O POS
Antibody Screen: NEGATIVE
Unit division: 0
Unit division: 0

## 2019-10-24 LAB — PREPARE FRESH FROZEN PLASMA

## 2019-10-24 LAB — BASIC METABOLIC PANEL
Anion gap: 10 (ref 5–15)
BUN: 17 mg/dL (ref 6–20)
CO2: 20 mmol/L — ABNORMAL LOW (ref 22–32)
Calcium: 7.3 mg/dL — ABNORMAL LOW (ref 8.9–10.3)
Chloride: 103 mmol/L (ref 98–111)
Creatinine, Ser: 0.66 mg/dL (ref 0.61–1.24)
GFR calc Af Amer: >60 mL/min (ref >60)
GFR calc non Af Amer: >60 mL/min (ref >60)
Glucose, Bld: 89 mg/dL (ref 70–99)
Potassium: 3.1 mmol/L — ABNORMAL LOW (ref 3.5–5.1)
Sodium: 133 mmol/L — ABNORMAL LOW (ref 135–145)

## 2019-10-24 LAB — BPAM RBC
Blood Product Expiration Date: 202102042359
Blood Product Expiration Date: 202102042359
ISSUE DATE / TIME: 202101050951
ISSUE DATE / TIME: 202101051307
Unit Type and Rh: 5100
Unit Type and Rh: 5100

## 2019-10-24 LAB — LACTIC ACID, PLASMA: Lactic Acid, Venous: 1 mmol/L (ref 0.5–1.9)

## 2019-10-24 LAB — BPAM FFP
Blood Product Expiration Date: 202101072359
ISSUE DATE / TIME: 202101050754
Unit Type and Rh: 8400

## 2019-10-24 LAB — CBC
HCT: 27.8 % — ABNORMAL LOW (ref 39.0–52.0)
Hemoglobin: 9.7 g/dL — ABNORMAL LOW (ref 13.0–17.0)
MCH: 33 pg (ref 26.0–34.0)
MCHC: 34.9 g/dL (ref 30.0–36.0)
MCV: 94.6 fL (ref 80.0–100.0)
Platelets: 48 10*3/uL — ABNORMAL LOW (ref 150–400)
RBC: 2.94 MIL/uL — ABNORMAL LOW (ref 4.22–5.81)
RDW: 19.4 % — ABNORMAL HIGH (ref 11.5–15.5)
WBC: 7.6 10*3/uL (ref 4.0–10.5)
nRBC: 0 % (ref 0.0–0.2)

## 2019-10-24 LAB — PROTIME-INR
INR: 1.7 — ABNORMAL HIGH (ref 0.8–1.2)
Prothrombin Time: 19.9 seconds — ABNORMAL HIGH (ref 11.4–15.2)

## 2019-10-24 LAB — MAGNESIUM
Magnesium: 1.8 mg/dL (ref 1.7–2.4)
Magnesium: 1.7 mg/dL (ref 1.7–2.4)

## 2019-10-24 LAB — PHOSPHORUS
Phosphorus: 1.7 mg/dL — ABNORMAL LOW (ref 2.5–4.6)
Phosphorus: 2.7 mg/dL (ref 2.5–4.6)

## 2019-10-24 LAB — PROCALCITONIN: Procalcitonin: 0.45 ng/mL

## 2019-10-24 SURGERY — ESOPHAGOGASTRODUODENOSCOPY (EGD) WITH PROPOFOL
Anesthesia: Monitor Anesthesia Care

## 2019-10-24 MED ORDER — LIDOCAINE VISCOUS HCL 2 % MT SOLN
15.0000 mL | Freq: Once | OROMUCOSAL | Status: AC
  Administered 2019-10-24: 15:00:00 15 mL via ORAL
  Filled 2019-10-24: qty 15

## 2019-10-24 MED ORDER — PROPOFOL 10 MG/ML IV BOLUS
INTRAVENOUS | Status: DC | PRN
  Administered 2019-10-24: 20 mg via INTRAVENOUS
  Administered 2019-10-24: 30 mg via INTRAVENOUS

## 2019-10-24 MED ORDER — LIDOCAINE HCL (CARDIAC) PF 100 MG/5ML IV SOSY
PREFILLED_SYRINGE | INTRAVENOUS | Status: DC | PRN
  Administered 2019-10-24: 60 mg via INTRATRACHEAL

## 2019-10-24 MED ORDER — PHENYLEPHRINE HCL (PRESSORS) 10 MG/ML IV SOLN
INTRAVENOUS | Status: DC | PRN
  Administered 2019-10-24: 80 ug via INTRAVENOUS
  Administered 2019-10-24: 120 ug via INTRAVENOUS

## 2019-10-24 MED ORDER — DEXMEDETOMIDINE HCL 200 MCG/2ML IV SOLN
INTRAVENOUS | Status: DC | PRN
  Administered 2019-10-24: 8 ug via INTRAVENOUS

## 2019-10-24 MED ORDER — SIMETHICONE 40 MG/0.6ML PO SUSP
40.0000 mg | Freq: Four times a day (QID) | ORAL | Status: DC | PRN
  Filled 2019-10-24: qty 0.6

## 2019-10-24 MED ORDER — POTASSIUM PHOSPHATES 15 MMOLE/5ML IV SOLN
30.0000 mmol | Freq: Once | INTRAVENOUS | Status: AC
  Administered 2019-10-24: 30 mmol via INTRAVENOUS
  Filled 2019-10-24: qty 10

## 2019-10-24 MED ORDER — PROPOFOL 500 MG/50ML IV EMUL
INTRAVENOUS | Status: DC | PRN
  Administered 2019-10-24: 100 ug/kg/min via INTRAVENOUS

## 2019-10-24 MED ORDER — EPHEDRINE SULFATE 50 MG/ML IJ SOLN
INTRAMUSCULAR | Status: DC | PRN
  Administered 2019-10-24: 5 mg via INTRAVENOUS
  Administered 2019-10-24: 10 mg via INTRAVENOUS

## 2019-10-24 MED ORDER — ALUM & MAG HYDROXIDE-SIMETH 200-200-20 MG/5ML PO SUSP
30.0000 mL | Freq: Once | ORAL | Status: AC
  Administered 2019-10-24: 30 mL via ORAL
  Filled 2019-10-24: qty 30

## 2019-10-24 MED ORDER — SIMETHICONE 40 MG/0.6ML PO SUSP
80.0000 mg | Freq: Four times a day (QID) | ORAL | Status: DC | PRN
  Administered 2019-10-24 – 2019-10-26 (×4): 80 mg via ORAL
  Filled 2019-10-24 (×7): qty 1.2

## 2019-10-24 MED ORDER — SODIUM CHLORIDE 0.9 % IV SOLN: INTRAVENOUS | Status: DC | PRN

## 2019-10-24 MED ORDER — VITAMIN K1 10 MG/ML IJ SOLN
5.0000 mg | Freq: Once | INTRAVENOUS | Status: AC
  Administered 2019-10-24: 5 mg via INTRAVENOUS
  Filled 2019-10-24: qty 0.5

## 2019-10-24 MED ORDER — SODIUM CHLORIDE 0.9 % IV BOLUS
1000.0000 mL | Freq: Once | INTRAVENOUS | Status: AC
  Administered 2019-10-24: 1000 mL via INTRAVENOUS

## 2019-10-24 MED ORDER — GERHARDT'S BUTT CREAM
TOPICAL_CREAM | Freq: Three times a day (TID) | CUTANEOUS | Status: DC
  Administered 2019-10-29 – 2019-11-03 (×5): 1 via TOPICAL
  Filled 2019-10-24 (×3): qty 1

## 2019-10-24 SURGICAL SUPPLY — 15 items

## 2019-10-24 NOTE — Transfer of Care (Signed)
Immediate Anesthesia Transfer of Care Note  Patient: Tom Bender  Procedure(s) Performed: ESOPHAGOGASTRODUODENOSCOPY (EGD) WITH PROPOFOL (N/A )  Patient Location: Endoscopy Unit  Anesthesia Type:MAC  Level of Consciousness: drowsy and patient cooperative  Airway & Oxygen Therapy: Patient Spontanous Breathing and Patient connected to nasal cannula oxygen  Post-op Assessment: Report given to RN and Post -op Vital signs reviewed and stable  Post vital signs: Reviewed  Last Vitals:  Vitals Value Taken Time  BP    Temp    Pulse 109 10/24/19 1104  Resp 30 10/24/19 1104  SpO2 96 % 10/24/19 1104  Vitals shown include unvalidated device data.  Last Pain:  Vitals:   10/24/19 0941  TempSrc: Temporal  PainSc: 9       Patients Stated Pain Goal: 0 (78/46/96 2952)  Complications: No apparent anesthesia complications

## 2019-10-24 NOTE — Anesthesia Procedure Notes (Signed)
Procedure Name: MAC Date/Time: 10/24/2019 10:43 AM Performed by: Kathryne Hitch, CRNA Pre-anesthesia Checklist: Patient identified, Emergency Drugs available, Suction available and Patient being monitored Patient Re-evaluated:Patient Re-evaluated prior to induction Oxygen Delivery Method: Nasal cannula Preoxygenation: Pre-oxygenation with 100% oxygen Induction Type: IV induction Dental Injury: Teeth and Oropharynx as per pre-operative assessment

## 2019-10-24 NOTE — Progress Notes (Signed)
eLink Physician-Brief Progress Note Patient Name: Tom Bender DOB: 12/22/1960 MRN: [id number]   Date of Service  10/24/2019  HPI/Events of Note  Hypotension - BP = 77/60 with MAP = 67 and 2. Thrombocytopenia - Platelets = 49 --> 48. Hgb = 9.7. Etiology? Consumption?  eICU Interventions  Will order: 1. Bolus with 0.9 NaCl 1 liter IV over 1 hour now.  2. Continue to trend platelet count.      Intervention Category Major Interventions: Hypotension - evaluation and management  Daimon Kean Eugene 10/24/2019, 1:38 AM

## 2019-10-24 NOTE — Anesthesia Preprocedure Evaluation (Addendum)
Anesthesia Evaluation  Patient identified by MRN, date of birth, ID band Patient awake    Reviewed: Allergy & Precautions, NPO status , Patient's Chart, lab work & pertinent test results  Airway Mallampati: II  TM Distance: >3 FB     Dental  (+) Poor Dentition   Pulmonary Current Smoker and Patient abstained from smoking.,    + rhonchi        Cardiovascular hypertension,  Rhythm:Regular Rate:Normal     Neuro/Psych    GI/Hepatic   Endo/Other    Renal/GU      Musculoskeletal   Abdominal   Peds  Hematology   Anesthesia Other Findings   Reproductive/Obstetrics                            Anesthesia Physical Anesthesia Plan  ASA: III  Anesthesia Plan: MAC   Post-op Pain Management:    Induction: Intravenous  PONV Risk Score and Plan:   Airway Management Planned: Nasal Cannula and Natural Airway  Additional Equipment:   Intra-op Plan:   Post-operative Plan:   Informed Consent: I have reviewed the patients History and Physical, chart, labs and discussed the procedure including the risks, benefits and alternatives for the proposed anesthesia with the patient or authorized representative who has indicated his/her understanding and acceptance.       Plan Discussed with: CRNA and Anesthesiologist  Anesthesia Plan Comments:         Anesthesia Quick Evaluation

## 2019-10-24 NOTE — Anesthesia Postprocedure Evaluation (Signed)
Anesthesia Post Note  Patient: Tom Bender  Procedure(s) Performed: ESOPHAGOGASTRODUODENOSCOPY (EGD) WITH PROPOFOL (N/A )     Patient location during evaluation: Endoscopy Anesthesia Type: MAC Level of consciousness: awake and alert Pain management: pain level controlled Vital Signs Assessment: post-procedure vital signs reviewed and stable Respiratory status: spontaneous breathing, nonlabored ventilation, respiratory function stable and patient connected to nasal cannula oxygen Cardiovascular status: stable and blood pressure returned to baseline Postop Assessment: no apparent nausea or vomiting Anesthetic complications: no    Last Vitals:  Vitals:   10/24/19 1737 10/24/19 2000  BP: 96/76 95/83  Pulse: 82 92  Resp: 18 (!) 24  Temp: 36.8 C 37 C  SpO2: 100% 98%    Last Pain:  Vitals:   10/24/19 2000  TempSrc: Oral  PainSc:                  [name] COKER

## 2019-10-24 NOTE — Progress Notes (Signed)
Pt received from 2H. Oriented to unit and room. CHG wipes applied. Stage 1 pressure ulcer on sacrum, noted in report, pink and painful with barrier cream on it. Pt connected to tele box and CCMD called. VSS. Pt feeling anxious and asking for Ativan. Will administer according to protocol.  Hazle Nordmann, RN

## 2019-10-24 NOTE — Progress Notes (Signed)
PCCM Brief Progress Note  Notified by RN that patient is experiencing abdominal discomfort after EGD. ? Possibly gas-related/cramping vs discomfort from procedure.   P -PRN simethicone -1x GI cocktail + viscous lido     Tessie Fass MSN, AGACNP-BC [facility] Pulmonary/Critical Care Medicine 10/24/2019, 2:22 PM

## 2019-10-24 NOTE — Op Note (Signed)
[facility] Patient Name: Tom Bender Procedure Date : 10/24/2019 MRN: [id number] Attending MD: Docia Chuck. Henrene Pastor , MD Date of Birth: [birth year] CSN: [id number] Age: 59 Admit Type: Inpatient Procedure:                Upper GI endoscopy Indications:              Coffee-ground emesis Providers:                Docia Chuck. Henrene Pastor, MD, Angus Seller, Otho Ket Alfonse Spruce,                            Technician Referring MD:             [facility] Medicines:                Monitored Anesthesia Care Complications:            No immediate complications. Estimated Blood Loss:     Estimated blood loss: none. Procedure:                Pre-Anesthesia Assessment:                           - Prior to the procedure, a History and Physical                            was performed, and patient medications and                            allergies were reviewed. The patient's tolerance of                            previous anesthesia was also reviewed. The risks                            and benefits of the procedure and the sedation                            options and risks were discussed with the patient.                            All questions were answered, and informed consent                            was obtained. Prior Anticoagulants: The patient has                            taken no previous anticoagulant or antiplatelet                            agents. ASA Grade Assessment: IV - A patient with                            severe systemic disease that is a constant threat  to life. After reviewing the risks and benefits,                            the patient was deemed in satisfactory condition to                            undergo the procedure.                           After obtaining informed consent, the endoscope was                            passed under direct vision. Throughout the                            procedure, the patient's blood  pressure, pulse, and                            oxygen saturations were monitored continuously. The                            GIF-H190 ([id number]) Olympus gastroscope was                            introduced through the mouth, and advanced to the                            second part of duodenum. The upper GI endoscopy was                            accomplished without difficulty. The patient                            tolerated the procedure well. Scope In: Scope Out: Findings:      A non-bleeding Mallory-Weiss tear with no stigmata of recent bleeding       was found.      Grade I varices were found in the lower third of the esophagus. There       with 4 columns 1 of which was trace while the other 3 were 1+. No       stigmata of bleed or high risk characteristics.      The stomach appeared to have small proximal gastric varices. No       stigmata. Also, mild portal hypertensive gastropathy. No blood in the       upper GI tract.      The examined duodenum was normal.      The cardia and gastric fundus were normal on retroflexion. Impression:               1. Small esophageal varices without stigmata                           2. Small Mallory-Weiss tear without stigmata. This                            explains coffee-ground  emesis                           3. Possible small proximal gastric varices                           4. Mild portal gastropathy                           5. Otherwise unremarkable EGD                           6. Alcoholic hepatitis                           7. Chronic alcoholism. Recommendation:           1. Stop octreotide                           2. Continue PPI                           3. Advance diet as tolerated                           4. Stop drinking alcohol forever                           5. Please arrange for this patient some sort of                            alcohol rehab program                           6. After discharge, follow-up with  his primary                            gastroenterologist in [facility], Dr. Vashti Hey.                           Patient given a copy of his report. GI will sign                            off. Procedure Code(s):        --- Professional ---                           [id number], Esophagogastroduodenoscopy, flexible,                            transoral; diagnostic, including collection of                            specimen(s) by brushing or washing, when performed                            (  separate procedure) Diagnosis Code(s):        --- Professional ---                           K22.6, Gastro-esophageal laceration-hemorrhage                            syndrome                           I85.00, Esophageal varices without bleeding                           K92.0, Hematemesis CPT copyright 2019 American Medical Association. All rights reserved. The codes documented in this report are preliminary and upon coder review may  be revised to meet current compliance requirements. Wilhemina Bonito. Marina Goodell, MD 10/24/2019 11:10:56 AM This report has been signed electronically. Number of Addenda: 0

## 2019-10-24 NOTE — Progress Notes (Signed)
eLink Physician-Brief Progress Note Patient Name: Tom Bender DOB: 20-Oct-1960 MRN: [id number]   Date of Service  10/24/2019  HPI/Events of Note  Abdominal bloating and distention - Currently on Mylicon 40 mg PO Q 4 hours PRN.   eICU Interventions  Will increase Mylicon dose to 80 mg PO Q 4 hours PRN.      Intervention Category Major Interventions: Other:  Lenell Antu 10/24/2019, 10:15 PM

## 2019-10-24 NOTE — Progress Notes (Addendum)
PULMONARY / CRITICAL CARE MEDICINE INTERVAL PROGRESS NOTE   Name: Tom Bender MRN: [id number] DOB: Mar 03, 1961    ADMISSION DATE:  10/22/2019 CONSULTATION DATE:  1/5  REFERRING MD:  [facility]  CHIEF COMPLAINT:  hematemesis  HISTORY OF PRESENT ILLNESS:   59 year old alcoholic has had multiple admissions for GI bleed and has undergone upper and lower endoscopies.  He has known varices and erosions in the esophagus.  He presents to [facility] this time with 2 weeks of bloody diarrhea upper GI bleed with blood noted to be hypotensive and treated with FFP packed red blood cells and fluids.  Pulmonary critical care asked to assume his care at this time due to the fact he may need vasopressors and intubation. At the time of examination is awake alert no acute distress and is being transfused.  PAST MEDICAL HISTORY :  He  has a past medical history of ETOH abuse, Hypertension, Liver disease, and Renal insufficiency.  SUBJECTIVE:  Pt reports continued abd pain but no further hematemesis.  Dark tarry stools overnight.    VITAL SIGNS: BP 103/77   Pulse 89   Temp 98.1 F (36.7 C) (Oral)   Resp 19   Ht 5\' 9"  (1.753 m)   Wt 72.7 kg   SpO2 100%   BMI 23.67 kg/m   INTAKE / OUTPUT: I/O last 3 completed shifts: In: 9626.2 [I.V.:3338.2; Blood:2088; IV Piggyback:4200] Out: 403 [Urine:400; Stool:3]  PHYSICAL EXAMINATION: General:  Disheveled thin man Neuro:  Alert and oriented HEENT: NCAT Cardiovascular:  RRR, no MRG Lungs:  CTAB Abdomen:  Distended, exquisitely tender diffusely but not rigid Skin:  Warm, dry, No rashes    Imaging AM DG Chest Port 1 View  Result Date: 10/24/2019 CLINICAL DATA:  Abnormal respiration EXAM: PORTABLE CHEST 1 VIEW COMPARISON:  Yesterday FINDINGS: Normal heart size and stable mediastinal contours. Low volume chest. There is no edema, consolidation, effusion, or pneumothorax. Cholecystectomy clips. IMPRESSION: Negative low volume chest.  Electronically Signed   By: 12/22/2019 M.D.   On: 10/24/2019 05:58     MICRO: Coronavirus neg  ANTIBIOTICS: Ceftriaxone 1/5-->  ASSESSMENT / PLAN:   CARDIOVASCULAR A:  Hypovolemic shock: hemorrhagic, other GI losses as well as poor hydration and alcohol use.  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 Acute blood loss anemia Thrombocytopenia: 2/2 liver disease chronic but slightly lower with bleeding P:   -GI following EGD this am -continue octreotide, ceftriaxone and PPI -CIWA w/ Ativan -Transfuse PRN -responded well to 5mg  vit K has also recd 1u FFP will give another 5mg  Vit K -continue to monitor platelets if requiring another transfusion would given platelets as well  INFECTIOUS A:   No identified infection P:   -continue ceftriaxone for ppx  Transfer to progressive unit after EGD FAMILY  - Updates:   - Inter-disciplinary family meet or Palliative Care meeting due by:  day 7    Pulmonary and Critical Care Medicine [facility] Pager: [phone] MD PGY-3 Internal Medicine Pager # [phone]  10/24/2019, 7:35 AM

## 2019-10-24 NOTE — Consult Note (Signed)
WOC Nurse Consult Note: Patient receiving care in [facility] 2H12. Reason for Consult: "moisture injury upon arrival" Wound type: MASD-IT, fungal involvement to buttocks, scrotum, inner upper thighs Pressure Injury POA: Yes/No/NA Measurement: Wound bed: erythematous with satellite lesions Drainage (amount, consistency, odor) nonw Periwound: intact Dressing procedure/placement/frequency: TID application of Gerhardt's butt cream. Monitor the wound area(s) for worsening of condition such as: Signs/symptoms of infection,  Increase in size,  Development of or worsening of odor, Development of pain, or increased pain at the affected locations.  Notify the medical team if any of these develop.  Thank you for the consult.  Discussed plan of care with the patient and bedside nurse.  WOC nurse will not follow at this time.  Please re-consult the WOC team if needed.  Helmut Muster, RN, MSN, CWOCN, CNS-BC, pager [phone]

## 2019-10-24 NOTE — Progress Notes (Signed)
Called by CCM to pick-up this patient starting tomorrow.  59 yo w/ GI bleed which worsened w/ hypotension, sent to ICU and BP's recovered. Went for EGD today, on PPI IV and octreotide now. VSS stable and ready for transfer out of ICU today.   Vinson Moselle, MD  [facility] 10/24/2019, 12:56 PM

## 2019-10-24 NOTE — Interval H&P Note (Signed)
History and Physical Interval Note:  10/24/2019 10:31 AM  Tom Bender  has presented today for surgery, with the diagnosis of GI bleed.  Hematemesis, melenic stool..  The various methods of treatment have been discussed with the patient and family. After consideration of risks, benefits and other options for treatment, the patient has consented to  Procedure(s): ESOPHAGOGASTRODUODENOSCOPY (EGD) WITH PROPOFOL (N/A) as a surgical intervention.  The patient's history has been reviewed, patient examined, no change in status, stable for surgery.  I have reviewed the patient's chart and labs.  Questions were answered to the patient's satisfaction.     Yancey Flemings

## 2019-10-25 ENCOUNTER — Inpatient Hospital Stay (HOSPITAL_COMMUNITY): Payer: Self-pay

## 2019-10-25 DIAGNOSIS — E876 Hypokalemia: Secondary | ICD-10-CM

## 2019-10-25 LAB — CBC
HCT: 28.3 % — ABNORMAL LOW (ref 39.0–52.0)
Hemoglobin: 9.9 g/dL — ABNORMAL LOW (ref 13.0–17.0)
MCH: 33.2 pg (ref 26.0–34.0)
MCHC: 35 g/dL (ref 30.0–36.0)
MCV: 95 fL (ref 80.0–100.0)
Platelets: 48 10*3/uL — ABNORMAL LOW (ref 150–400)
RBC: 2.98 MIL/uL — ABNORMAL LOW (ref 4.22–5.81)
RDW: 19.2 % — ABNORMAL HIGH (ref 11.5–15.5)
WBC: 4.9 10*3/uL (ref 4.0–10.5)
nRBC: 0 % (ref 0.0–0.2)

## 2019-10-25 LAB — AMYLASE: Amylase: 39 U/L (ref 28–100)

## 2019-10-25 LAB — BASIC METABOLIC PANEL
Anion gap: 7 (ref 5–15)
BUN: 5 mg/dL — ABNORMAL LOW (ref 6–20)
CO2: 24 mmol/L (ref 22–32)
Calcium: 7.4 mg/dL — ABNORMAL LOW (ref 8.9–10.3)
Chloride: 98 mmol/L (ref 98–111)
Creatinine, Ser: 0.47 mg/dL — ABNORMAL LOW (ref 0.61–1.24)
GFR calc Af Amer: >60 mL/min (ref >60)
GFR calc non Af Amer: >60 mL/min (ref >60)
Glucose, Bld: 104 mg/dL — ABNORMAL HIGH (ref 70–99)
Potassium: 3.2 mmol/L — ABNORMAL LOW (ref 3.5–5.1)
Sodium: 129 mmol/L — ABNORMAL LOW (ref 135–145)

## 2019-10-25 LAB — COMPREHENSIVE METABOLIC PANEL
ALT: 59 U/L — ABNORMAL HIGH (ref 0–44)
AST: 179 U/L — ABNORMAL HIGH (ref 15–41)
Albumin: 1.9 g/dL — ABNORMAL LOW (ref 3.5–5.0)
Alkaline Phosphatase: 123 U/L (ref 38–126)
Anion gap: 7 (ref 5–15)
BUN: 10 mg/dL (ref 6–20)
CO2: 24 mmol/L (ref 22–32)
Calcium: 7.4 mg/dL — ABNORMAL LOW (ref 8.9–10.3)
Chloride: 101 mmol/L (ref 98–111)
Creatinine, Ser: 0.54 mg/dL — ABNORMAL LOW (ref 0.61–1.24)
GFR calc Af Amer: >60 mL/min (ref >60)
GFR calc non Af Amer: >60 mL/min (ref >60)
Glucose, Bld: 113 mg/dL — ABNORMAL HIGH (ref 70–99)
Potassium: 2.7 mmol/L — CL (ref 3.5–5.1)
Sodium: 132 mmol/L — ABNORMAL LOW (ref 135–145)
Total Bilirubin: 4.1 mg/dL — ABNORMAL HIGH (ref 0.3–1.2)
Total Protein: 5 g/dL — ABNORMAL LOW (ref 6.5–8.1)

## 2019-10-25 LAB — PHOSPHORUS
Phosphorus: 1.8 mg/dL — ABNORMAL LOW (ref 2.5–4.6)
Phosphorus: 1.6 mg/dL — ABNORMAL LOW (ref 2.5–4.6)

## 2019-10-25 LAB — LIPASE, BLOOD: Lipase: 31 U/L (ref 11–51)

## 2019-10-25 LAB — MAGNESIUM
Magnesium: 1.6 mg/dL — ABNORMAL LOW (ref 1.7–2.4)
Magnesium: 1.6 mg/dL — ABNORMAL LOW (ref 1.7–2.4)

## 2019-10-25 LAB — PROCALCITONIN: Procalcitonin: 0.16 ng/mL

## 2019-10-25 MED ORDER — SUCRALFATE 1 GM/10ML PO SUSP
1.0000 g | Freq: Three times a day (TID) | ORAL | Status: DC
  Administered 2019-10-25 – 2019-11-15 (×83): 1 g via ORAL
  Filled 2019-10-25 (×77): qty 10

## 2019-10-25 MED ORDER — POTASSIUM CHLORIDE 10 MEQ/100ML IV SOLN
10.0000 meq | INTRAVENOUS | Status: AC
  Administered 2019-10-25 (×6): 10 meq via INTRAVENOUS
  Filled 2019-10-25 (×6): qty 100

## 2019-10-25 MED ORDER — BOOST / RESOURCE BREEZE PO LIQD CUSTOM
1.0000 | Freq: Three times a day (TID) | ORAL | Status: DC
  Administered 2019-10-25 – 2019-10-26 (×4): 1 via ORAL

## 2019-10-25 MED ORDER — K PHOS MONO-SOD PHOS DI & MONO 155-852-130 MG PO TABS
500.0000 mg | ORAL_TABLET | Freq: Two times a day (BID) | ORAL | Status: AC
  Administered 2019-10-25 – 2019-10-27 (×5): 500 mg via ORAL
  Filled 2019-10-25 (×7): qty 2

## 2019-10-25 MED ORDER — MAGNESIUM SULFATE IN D5W 1-5 GM/100ML-% IV SOLN
1.0000 g | Freq: Once | INTRAVENOUS | Status: AC
  Administered 2019-10-25: 1 g via INTRAVENOUS
  Filled 2019-10-25 (×3): qty 100

## 2019-10-25 MED ORDER — POTASSIUM CHLORIDE CRYS ER 20 MEQ PO TBCR
40.0000 meq | EXTENDED_RELEASE_TABLET | Freq: Once | ORAL | Status: AC
  Administered 2019-10-25: 16:00:00 40 meq via ORAL
  Filled 2019-10-25: qty 2

## 2019-10-25 NOTE — Progress Notes (Signed)
Progress Note    Tom Bender  RDE:[id number] DOB: 27-Jul-1961  DOA: 10/22/2019 PCP: Patient, No Pcp Per    Brief Narrative:   Patient is a 59 year old male with past medical history significant for alcohol abuse that is continuous, liver cirrhosis with varices and DVTs.  Patient was admitted with hematochezia and dark blood tinged emesis.  Patient endorsed generalized abdominal pain since having gallbladder surgery last June. For the last 2-3 weeks prior to presentation, patient reported having rectal bleeding. Stool was initially red and later turned black.  Patient has continued to report generalized weakness.  Patient was also falling at home.  As per prior documentation, patient has continued to consume alcohol, and admitted drinking 40 ounces of beer the day of presentation.  Patient usually drinks 40-80 ounces of beer a day.    10/25/2019: Patient has undergone EGD.  EGD revealed small esophageal and query small proximal gastric varices with mild lower Weiss tear.  Patient is still on PPI IV.  H&H is stable.  Significantly, potassium was noted to be 2.7 today, with albumin of 1.9.  Patient has received KCl 10 M EQ per hour x 6 runs.  Magnesium is 1.6.  We will give patient 1 g of IV magnesium and oral K-Dur 40 M EQ x1 dose.  Recheck labs in the morning.  Patient has continued to report feeling weak (likely multifactorial), as well as epigastric pain.  We will also add sucralfate.  Also consult physical therapy and Occupational Therapy.  Will optimize electrolytes.  Overall, prognosis is guarded.  Assessment/Plan:   Principal Problem:   GI bleed Active Problems:   Hyponatremia   Benign essential HTN   Alcohol abuse   Alcoholic hepatitis   Sinus tachycardia   Pressure injury of skin   Mallory-Weiss tear   Upper GI bleed: -hgb 8 this AM but had episodes of vomiting blood since so suspect it will be much lower shortly -transfuse 2 units PRBC -on protonix/sandostatin -his INR is  2: will give vitamin K and FFP -GI to see patient -PCCM consult as I suspect patient will need to be intubated for airway protection and EGD -monitor h/h 10/25/2019: Currently see above.  H&H is stable.  Continue IV PPI.  Add sucralfate.  Alcohol abuse, continuous:  -CIWA -Counseled to quit alcohol.  Hypomagnesemia: -Magnesium is 1.6 today. -IV magnesium 1 g x 1 dose. -Continue to monitor and replete magnesium.  Severe hypokalemia: -Kindly see above. -Potassium is 2.7 today. -Patient has received IV KCl 10 M EQ per hour x6 doses. -We will give patient another dose of K-Dur 40 M EQ p.o. x1 dose. -We will correct magnesium level.  Hypophosphatemia: -Start Neutra-Phos. -Phosphorus is 1.8. -Hopefully, will not build in with refeeding syndrome.  Hyponatremia: Likely related to the liver disease.  Liver cirrhosis with ascites: -Alcohol related -Guarded prognosis  Severe weakness and fatigue: -Multifactorial. -Consult PT and OT. -Correct electrolytes -Quit alcohol -Will not be deemed a candidate to liver transplantation as patient has continued to drink alcohol.   Family Communication/Anticipated D/C date and plan/Code Status   DVT prophylaxis: scd Code Status: Full Code.  Family Communication:  Disposition Plan: This will depend on hospital course   Medical Consultants:    GI  PCCM    Objective:    Vitals:   10/25/19 0300 10/25/19 0400 10/25/19 0800 10/25/19 1105  BP: 97/79 106/73 97/74 96/77   Pulse: 93 96 100 (!) 104  Resp: (!) 25 (!) 29 (!) 26 Marland Kitchen)  24  Temp:  98.6 F (37 C) 98 F (36.7 C) 97.8 F (36.6 C)  TempSrc:  Oral Oral Oral  SpO2: 95% 95% 96% 95%  Weight:      Height:        Intake/Output Summary (Last 24 hours) at 10/25/2019 1451 Last data filed at 10/25/2019 1100 Gross per 24 hour  Intake 1366.05 ml  Output 825 ml  Net 541.05 ml   Filed Weights   10/22/19 1837 10/24/19 0400 10/24/19 0941  Weight: 72.6 kg 72.7 kg 72.7 kg    GC: Not  in any distress.  Patient is awake and alert. HEENT: Pallor.  Jaundice. Neck: Supple.  No raised JVD. Abdomen: Distended with shifting dullness.  CVS: S1-S2.  Lungs: Clear to auscultation.  Neuro: Awake and alert.  Patient moves all extremities.    Data Reviewed:   I have personally reviewed following labs and imaging studies:  Labs: Labs show the following:   Basic Metabolic Panel: Recent Labs  Lab 10/22/19 1915 10/23/19 0452 10/23/19 0546 10/24/19 0625 10/24/19 1558 10/25/19 0253 10/25/19 0739  NA 122* 127*  --  133*  --  132*  --   K 4.6 4.0  --  3.1*  --  2.7*  --   CL 87* 94*  --  103  --  101  --   CO2 18* 17*  --  20*  --  24  --   GLUCOSE 83 57*  --  89  --  113*  --   BUN 14 17  --  17  --  10  --   CREATININE 0.79 0.67  --  0.66  --  0.54*  --   CALCIUM 7.5* 7.2*  --  7.3*  --  7.4*  --   MG  --   --  1.4* 1.8 1.7  --  1.6*  PHOS  --   --  2.3* 1.7* 2.7  --  1.8*   GFR Estimated Creatinine Clearance: 103.1 mL/min (A) (by C-G formula based on SCr of 0.54 mg/dL (L)). Liver Function Tests: Recent Labs  Lab 10/22/19 1915 10/23/19 0452 10/25/19 0253  AST 195* 141* 179*  ALT 60* 43 59*  ALKPHOS 191* 140* 123  BILITOT 4.5* 3.4* 4.1*  PROT 6.8 5.3* 5.0*  ALBUMIN 2.5* 1.9* 1.9*   Recent Labs  Lab 10/25/19 0253  LIPASE 31  AMYLASE 39   Recent Labs  Lab 10/23/19 0036  AMMONIA 41*   Coagulation profile Recent Labs  Lab 10/23/19 0452 10/24/19 0625  INR 2.0* 1.7*    CBC: Recent Labs  Lab 10/24/19 0030 10/24/19 0625 10/24/19 1234 10/24/19 1926 10/25/19 0253  WBC 7.6 6.1 5.1 4.9 4.9  NEUTROABS 4.9 4.0 3.3 3.0  --   HGB 9.7* 9.4* 9.6* 10.3* 9.9*  HCT 27.8* 27.5* 27.8* 29.3* 28.3*  MCV 94.6 96.8 97.2 96.4 95.0  PLT 48* 52* 48* 49* 48*   Cardiac Enzymes: No results for input(s): CKTOTAL, CKMB, CKMBINDEX, TROPONINI in the last 168 hours. BNP (last 3 results) No results for input(s): PROBNP in the last 8760 hours. CBG: Recent Labs  Lab  10/23/19 2001  GLUCAP 110*   D-Dimer: No results for input(s): DDIMER in the last 72 hours. Hgb A1c: No results for input(s): HGBA1C in the last 72 hours. Lipid Profile: No results for input(s): CHOL, HDL, LDLCALC, TRIG, CHOLHDL, LDLDIRECT in the last 72 hours. Thyroid function studies: No results for input(s): TSH, T4TOTAL, T3FREE, THYROIDAB in the last 72  hours.  Invalid input(s): FREET3 Anemia work up: No results for input(s): VITAMINB12, FOLATE, FERRITIN, TIBC, IRON, RETICCTPCT in the last 72 hours. Sepsis Labs: Recent Labs  Lab 10/23/19 0540 10/23/19 1826 10/24/19 0429 10/24/19 0625 10/24/19 1234 10/24/19 1926 10/25/19 0253 10/25/19 0739  PROCALCITON  --  0.58  --  0.45  --   --   --  0.16  WBC  --  7.8  --  6.1 5.1 4.9 4.9  --   LATICACIDVEN 3.2*  --  1.0  --   --   --   --   --     Microbiology Recent Results (from the past 240 hour(s))  SARS CORONAVIRUS 2 (TAT 6-24 HRS) Nasopharyngeal Nasopharyngeal Swab     Status: None   Collection Time: 10/23/19  6:50 AM   Specimen: Nasopharyngeal Swab  Result Value Ref Range Status   SARS Coronavirus 2 NEGATIVE NEGATIVE Final    Comment: (NOTE) SARS-CoV-2 target nucleic acids are NOT DETECTED. The SARS-CoV-2 RNA is generally detectable in upper and lower respiratory specimens during the acute phase of infection. Negative results do not preclude SARS-CoV-2 infection, do not rule out co-infections with other pathogens, and should not be used as the sole basis for treatment or other patient management decisions. Negative results must be combined with clinical observations, patient history, and epidemiological information. The expected result is Negative. Fact Sheet for Patients: HairSlick.no Fact Sheet for Healthcare Providers: quierodirigir.com This test is not yet approved or cleared by the [place] FDA and  has been authorized for detection and/or diagnosis of  SARS-CoV-2 by FDA under an Emergency Use Authorization (EUA). This EUA will remain  in effect (meaning this test can be used) for the duration of the COVID-19 declaration under Section 56 4(b)(1) of the Act, 21 U.S.C. section 360bbb-3(b)(1), unless the authorization is terminated or revoked sooner. Performed at [facility], [address], [place], [place] [address]     Procedures and diagnostic studies:  AM DG Chest Port 1 View  Result Date: 10/24/2019 CLINICAL DATA:  Abnormal respiration EXAM: PORTABLE CHEST 1 VIEW COMPARISON:  Yesterday FINDINGS: Normal heart size and stable mediastinal contours. Low volume chest. There is no edema, consolidation, effusion, or pneumothorax. Cholecystectomy clips. IMPRESSION: Negative low volume chest. Electronically Signed   By: Marnee Spring M.D.   On: 10/24/2019 05:58   DG Abd Portable 1V  Result Date: 10/25/2019 CLINICAL DATA:  59 year old male with abdominal pain. EXAM: PORTABLE ABDOMEN - 1 VIEW COMPARISON:  Abdominal radiograph dated 04/03/2019 and CT dated 09/15/2019 FINDINGS: Evaluation is limited due to body habitus. There is no bowel dilatation or evidence of obstruction. Air is noted within the colon. No free air or radiopaque calculi identified. Right upper quadrant cholecystectomy clips. The osseous structures and soft tissues are grossly unremarkable. IMPRESSION: No evidence of bowel obstruction. No free air. Electronically Signed   By: Elgie Collard M.D.   On: 10/25/2019 02:31    Medications:   . Chlorhexidine Gluconate Cloth  6 each Topical Daily  . folic acid  1 mg Oral Daily  . Gerhardt's butt cream   Topical TID  . mouth rinse  15 mL Mouth Rinse BID  . multivitamin with minerals  1 tablet Oral Daily  . [START ON 10/26/2019] pantoprazole  40 mg Intravenous Q12H  . thiamine  100 mg Oral Daily   Or  . thiamine  100 mg Intravenous Daily   Continuous Infusions: . cefTRIAXone (ROCEPHIN)  IV Stopped (10/25/19 0553)  .  pantoprozole (PROTONIX) infusion 8 mg/hr (10/25/19 0600)  . potassium chloride 10 mEq (10/25/19 1358)     LOS: 2 days   Barnetta Chapel  [facility]   How to contact the [facility] Attending or Consulting provider 7A - 7P or covering provider during after hours 7P -7A, for this patient?  1. Check the care team in [facility] and look for a) attending/consulting [facility] provider listed and b) the [facility] team listed 2. Log into [email or web address] and use [place]'s universal password to access. If you do not have the password, please contact the hospital operator. 3. Locate the [facility] provider you are looking for under [facility] and page to a number that you can be directly reached. 4. If you still have difficulty reaching the provider, please page the [facility] (Director on Call) for the Hospitalists listed on amion for assistance.  10/25/2019, 2:51 PM

## 2019-10-25 NOTE — Progress Notes (Signed)
Patient still complaining of 9/10 abdominal pain after increase in simethicone dosage per MD Arsenio Loader. Elink notified. Awaiting orders. Will continue to monitor.

## 2019-10-25 NOTE — Progress Notes (Signed)
eLink Physician-Brief Progress Note Patient Name: Tom Bender DOB: 07-27-1961 MRN: [id number]   Date of Service  10/25/2019  HPI/Events of Note  Hypokalemia - K+ = 2.7 and Creatinine = 0.54.   eICU Interventions  Will order: 1. Replace K+. 2. Repeat BMP at 2 PM.     Intervention Category Major Interventions: Electrolyte abnormality - evaluation and management  [name] 10/25/2019, 5:57 AM

## 2019-10-25 NOTE — Progress Notes (Signed)
eLink Physician-Brief Progress Note Patient Name: Tom Bender DOB: 1961-04-28 MRN: [id number]   Date of Service  10/25/2019  HPI/Events of Note  Platelets = 48 --> 49 --> 48 for the last 3 days.  eICU Interventions  Continue to trend platelet count.      Intervention Category Intermediate Interventions: Thrombocytopenia - evaluation and management  Mykiah Schmuck Eugene 10/25/2019, 4:18 AM

## 2019-10-25 NOTE — Progress Notes (Signed)
CRITICAL VALUE ALERT  Critical Value:  Platelets 48  Date & Time Notied:  10/25/19 0407  Provider Notified: Pola Corn  Orders Received/Actions taken: awaiting orders. Will continue to monitor.

## 2019-10-25 NOTE — Progress Notes (Signed)
Called to bedside to assess patient's complaint of ongoing severe abdominal pain.  Patient states he also cant sleep.  He states this is not new pain or acute change in pain, rather ongoing chronic pain.  He states nothing relieves his pain including tylenol, GI cocktail, or simethicone.  Denies and N/V episodes.   Points to lower abd for specific site of pain but also diffuse mild pain.    Patient has been voiding without retention.  Several small black/green stools overnight per RN.  No acute change in hemodynamics.   EGD 1/6 notes reviewed.    Blood pressure (!) 99/72, pulse 82, temperature 98.7 F (37.1 C), temperature source Oral, resp. rate (!) 21, height 5\' 9"  (1.753 m), weight 72.7 kg, SpO2 94 %.  On exam, male lying in bed in NAD, alert, oriented, MAE, hyperactive bs, protuberant soft abd, condom cath in place.    He is on abx, remains on protonix and octreotide gtt.   He just received ativan 2mg  per his CIWA (last dose during the day).  Hopefully this will help him sleep some  P:  Continue PPI per recs D/c octreotide per GI recs Abd XR pending Check lipase, CBC, CMET, INR     , MSN, AGACNP-BC [facility] 10/25/2019, 2:43 AM

## 2019-10-25 NOTE — Progress Notes (Signed)
CRITICAL VALUE ALERT  Critical Value:  Potassium 2.7  Date & Time Notied:  10/25/19 0555  Provider Notified: MD Arsenio Loader  Orders Received/Actions taken: awaiting new orders. Will continue to monitor.

## 2019-10-25 NOTE — Evaluation (Signed)
Physical Therapy Evaluation Patient Details Name: Tom Bender MRN: [id number] DOB: [birth year] Today's Date: 10/25/2019   History of Present Illness  Pt is 59 year old male with PMH significant for alcohol abuse that is continuous, liver cirrhosis with varices and DVTs.  Patient was admitted with hematochezia and dark blood tinged emesis.  Pt s/p EGD revealed small esophageal and query small proximal gastric varices with mild lower Weiss tear.  Hgb is 9.9.  Potassium is 3.2.  Clinical Impression  Pt admitted with above diagnosis. Evaluation was limited due to orthostatic hypotension.  Pt requiring mod A for transfers to EOB.  Demonstrating significant weakness throughout.  Pt lethargic, mild confusion, needing increased cues and time.  Pt currently with functional limitations due to the deficits listed below (see PT Problem List). Pt will benefit from skilled PT to increase their independence and safety with mobility to allow discharge to the venue listed below.       Follow Up Recommendations SNF    Equipment Recommendations  Other (comment)(TBD next venue)    Recommendations for Other Services       Precautions / Restrictions Precautions Precautions: Fall Precaution Comments: monitor BP      Mobility  Bed Mobility Overal bed mobility: Needs Assistance Bed Mobility: Supine to Sit;Sit to Supine     Supine to sit: Mod assist;HOB elevated Sit to supine: Mod assist;HOB elevated   General bed mobility comments: increased time and cues  Transfers     Transfers: Lateral/Scoot Transfers           General transfer comment: standing deferred due to symptomatic orthostatic hypotension; pt did perform 2 small lateral scoots toward HOB with mod A  Ambulation/Gait             General Gait Details: deferred  Stairs            Wheelchair Mobility    Modified Rankin (Stroke Patients Only)       Balance Overall balance assessment: Needs  assistance Sitting-balance support: Bilateral upper extremity supported;Feet supported Sitting balance-Leahy Scale: Fair         Standing balance comment: unable                             Pertinent Vitals/Pain Pain Assessment: 0-10 Pain Score: 9  Pain Location: stomach Pain Descriptors / Indicators: Discomfort Pain Intervention(s): Repositioned;Limited activity within patient's tolerance   At rest HR 80's; O2 sats 97% on RA BP as follows: 97/69 supine HR 83 158/128 immediate sitting - error in reading pt kept moving arm, c/o dizziness 83/55 sitting after 2 mins - pt relaxed arm, c/o dizziness ; HR 103 92/67 return to sitting HR 90    Home Living Family/patient expects to be discharged to:: Private residence Living Arrangements: Other relatives(sister) Available Help at Discharge: Family(sister in w/c and lives downstairs) Type of Home: House Home Access: Stairs to enter Entrance Stairs-Rails: Right Entrance Stairs-Number of Steps: 4 Home Layout: Two level Home Equipment: None      Prior Function Level of Independence: Needs assistance   Gait / Transfers Assistance Needed: Prior to last 2 weeks pt could walk without AD but "wobbly"; reports gradual weakness over last 2 weeks to point of not walking  ADL's / Homemaking Assistance Needed: Prior to past 2 weeks pt was independent with ADL  Comments: Pt is [place] historian; required questions multiple times to get clear picture of PLOF     Hand Dominance  Extremity/Trunk Assessment   Upper Extremity Assessment Upper Extremity Assessment: LUE deficits/detail;RUE deficits/detail RUE Deficits / Details: ROM WFL; MMT: Shoulder 2/5, elbow and hand 3/5 LUE Deficits / Details: ROM WFL; MMT: Shoulder 2/5, elbow and hand 3/5    Lower Extremity Assessment Lower Extremity Assessment: RLE deficits/detail;LLE deficits/detail RLE Deficits / Details: ROM WFL; MMT hip 2/5, knee 4-/5 , ankle 4-/5 LLE  Deficits / Details: ROM WFL; MMT hip 2/5, knee 4-/5 , ankle 4-/5    Cervical / Trunk Assessment Cervical / Trunk Assessment: Normal  Communication   Communication: No difficulties  Cognition Arousal/Alertness: Lethargic Behavior During Therapy: Flat affect Overall Cognitive Status: No family/caregiver present to determine baseline cognitive functioning Area of Impairment: Problem solving;Following commands                       Following Commands: Follows one step commands consistently     Problem Solving: Slow processing;Requires verbal cues;Requires tactile cues;Difficulty sequencing        General Comments      Exercises     Assessment/Plan    PT Assessment Patient needs continued PT services  PT Problem List Decreased strength;Decreased mobility;Decreased safety awareness;Decreased coordination;Decreased activity tolerance;Decreased cognition;Cardiopulmonary status limiting activity;Decreased balance;Decreased knowledge of use of DME       PT Treatment Interventions DME instruction;Therapeutic activities;Gait training;Therapeutic exercise;Patient/family education;Balance training;Functional mobility training    PT Goals (Current goals can be found in the Care Plan section)  Acute Rehab PT Goals Patient Stated Goal: not stated; agreeable to SNF - realizes not strong enough to return home PT Goal Formulation: With patient Time For Goal Achievement: 11/08/19 Potential to Achieve Goals: Fair    Frequency Min 2X/week   Barriers to discharge Decreased caregiver support;Inaccessible home environment      Co-evaluation               AM-PAC PT "6 Clicks" Mobility  Outcome Measure Help needed turning from your back to your side while in a flat bed without using bedrails?: A Lot Help needed moving from lying on your back to sitting on the side of a flat bed without using bedrails?: A Lot Help needed moving to and from a bed to a chair (including a  wheelchair)?: Total Help needed standing up from a chair using your arms (e.g., wheelchair or bedside chair)?: Total Help needed to walk in hospital room?: Total Help needed climbing 3-5 steps with a railing? : Total 6 Click Score: 8    End of Session   Activity Tolerance: Patient limited by fatigue;Other (comment)(limited by BP) Patient left: in bed;with call bell/phone within reach;with bed alarm set Nurse Communication: Mobility status PT Visit Diagnosis: Unsteadiness on feet (R26.81);Muscle weakness (generalized) (M62.81)    Time: 1950-9326 PT Time Calculation (min) (ACUTE ONLY): 30 min   Charges:   PT Evaluation $PT Eval Moderate Complexity: 1 Mod          Royetta Asal, PT Acute Rehab Services Pager [phone] [facility] [phone] [facility] [phone]   Rayetta Humphrey 10/25/2019, 5:45 PM

## 2019-10-25 NOTE — Progress Notes (Signed)
eLink Physician-Brief Progress Note Patient Name: Tom Bender DOB: 1961-08-14 MRN: [id number]   Date of Service  10/25/2019  HPI/Events of Note  Abdominal pain persists - No improvement with "GI Cocktails" X 2.  eICU Interventions  Will order: 1. Send AM labs now.  2. Amylase and lipase STAT. 3. Portable Abdominal film STAT. 4. Will ask ground team to examine the patient at bedside.      Intervention Category Major Interventions: Other:  Shawnelle Spoerl Dennard Nip 10/25/2019, 2:09 AM

## 2019-10-26 LAB — CBC WITH DIFFERENTIAL/PLATELET
Abs Immature Granulocytes: 0.01 10*3/uL (ref 0.00–0.07)
Basophils Absolute: 0.1 10*3/uL (ref 0.0–0.1)
Basophils Relative: 1 %
Eosinophils Absolute: 0.1 10*3/uL (ref 0.0–0.5)
Eosinophils Relative: 1 %
HCT: 29.5 % — ABNORMAL LOW (ref 39.0–52.0)
Hemoglobin: 10.1 g/dL — ABNORMAL LOW (ref 13.0–17.0)
Immature Granulocytes: 0 %
Lymphocytes Relative: 32 %
Lymphs Abs: 1.4 10*3/uL (ref 0.7–4.0)
MCH: 33.6 pg (ref 26.0–34.0)
MCHC: 34.2 g/dL (ref 30.0–36.0)
MCV: 98 fL (ref 80.0–100.0)
Monocytes Absolute: 0.6 10*3/uL (ref 0.1–1.0)
Monocytes Relative: 13 %
Neutro Abs: 2.3 10*3/uL (ref 1.7–7.7)
Neutrophils Relative %: 53 %
Platelets: 61 10*3/uL — ABNORMAL LOW (ref 150–400)
RBC: 3.01 MIL/uL — ABNORMAL LOW (ref 4.22–5.81)
RDW: 19.4 % — ABNORMAL HIGH (ref 11.5–15.5)
WBC: 4.4 10*3/uL (ref 4.0–10.5)
nRBC: 0 % (ref 0.0–0.2)

## 2019-10-26 LAB — RENAL FUNCTION PANEL
Albumin: 1.8 g/dL — ABNORMAL LOW (ref 3.5–5.0)
Anion gap: 9 (ref 5–15)
BUN: <5 mg/dL — ABNORMAL LOW (ref 6–20)
CO2: 26 mmol/L (ref 22–32)
Calcium: 7.5 mg/dL — ABNORMAL LOW (ref 8.9–10.3)
Chloride: 98 mmol/L (ref 98–111)
Creatinine, Ser: 0.46 mg/dL — ABNORMAL LOW (ref 0.61–1.24)
GFR calc Af Amer: >60 mL/min (ref >60)
GFR calc non Af Amer: >60 mL/min (ref >60)
Glucose, Bld: 87 mg/dL (ref 70–99)
Phosphorus: 2.2 mg/dL — ABNORMAL LOW (ref 2.5–4.6)
Potassium: 3.1 mmol/L — ABNORMAL LOW (ref 3.5–5.1)
Sodium: 133 mmol/L — ABNORMAL LOW (ref 135–145)

## 2019-10-26 LAB — MAGNESIUM: Magnesium: 1.7 mg/dL (ref 1.7–2.4)

## 2019-10-26 MED ORDER — ZOLPIDEM TARTRATE 5 MG PO TABS
5.0000 mg | ORAL_TABLET | Freq: Every evening | ORAL | Status: DC | PRN
  Administered 2019-10-26 – 2019-11-14 (×17): 5 mg via ORAL
  Filled 2019-10-26 (×17): qty 1

## 2019-10-26 MED ORDER — ENSURE ENLIVE PO LIQD
237.0000 mL | Freq: Three times a day (TID) | ORAL | Status: DC
  Administered 2019-10-26 – 2019-11-15 (×52): 237 mL via ORAL

## 2019-10-26 MED ORDER — TRAMADOL HCL 50 MG PO TABS
50.0000 mg | ORAL_TABLET | Freq: Four times a day (QID) | ORAL | Status: DC | PRN
  Administered 2019-10-26 – 2019-11-14 (×39): 50 mg via ORAL
  Filled 2019-10-26 (×39): qty 1

## 2019-10-26 NOTE — Progress Notes (Signed)
PROGRESS NOTE    Tom Bender  SKA:[id number] DOB: Aug 04, 1961 DOA: 10/22/2019 PCP: Patient, No Pcp Per   Brief Narrative:  HPI On 10/22/2018 by Dr. Lyda Perone Tom Bender is a 59 y.o. male with medical history significant of EtOH abuse that is ongoing, cirrhosis and varices, DTs.  Patient presents to the ED with c/o hematochezia.  He states that he has been having generalized abdominal pain since having gallbladder surgery last June. For the last 2-3 weeks, he has been having rectal bleeding. Stool is initially red and is now black. He has also had nausea and vomiting without any blood in the emesis. He feels generally weak and has been falling. He continues to consume alcohol and admits to drinking 40 ounces of beer today. He usually drinks 40-80 ounces of beer a day. He denies other drug use. He denies exposure to COVID-19.  On further review.  Looks like patient was admitted in Oct and again in Nov at [facility] with reports of GIB.  EGD in Oct just showed small non-bleeding varicies, no GAVE.  Colonoscopy in Aug with polypectomy done after c/o rectal bleeding then as well and bleeding thought to be due to internal hemorrhoids.  In Nov he was admitted for reported bleeding and initial S.Tachycardia.  Went into withdrawals the next day.  Was hemoccult positive then, though HGB remained stable so they didn't scope him.  Was found to have bacterial PNA during that admit.  Interim history Patient admitted with GI bleed.  He was given FFP, packed red blood cells, IV fluids, as he was noted to be hypotensive.  Patient was admitted to ICU.  Gastroenterology consulted, status post EGD. Assessment & Plan   Hypovolemic shock -Secondary to hemorrhagic cause, GI losses as well as poor hydration and alcohol use -Patient did have lactic acidosis but however this resolved with IV fluid resuscitation -Was given IV fluids as well as packed red blood cells  GI bleed/hematemesis, melena/acute  blood loss anemia/Symptomatic anemia -Hemoglobin dropped to 8.4, currently 10.1. -Transfused 1u FFP, 2uPRBC -EGD showed small esophageal varices without stigmata.  Small Mallory-Weiss tear without stigmata-which explains coffee-ground emesis.  Possible small proximal gastric varices.  Mild portal gastropathy.  Alcoholic hepatitis.  Chronic alcoholism.  Recommended to stop octreotide, continue PPI.  Advance diet as tolerated.  Alcohol cessation.  Patient to follow-up with his primary gastroenterologist in [facility] Dr. Marcelene [place]. -Patient complains of feeling weak  Abdominal pain  -Likely secondary to the above -Continue pain control, will add on tramadol -Continue sucraafate   Hepatic cirrhosis secondary to alcohol use disorder -On CIWA -needs alcohol counseling  Thrombocytopenia secondary to liver disease -platelets are stable, currently 61 -Continue to monitor CBC  Hypokalemia -Continue to replace and monitor  Hypomagnesemia -magnesium 1.7 -Continue to monitor and replace as needed  Hypophosphatemia -Continue replacement and monitor BMP  Hyponatremia -Sodium currently 133 -Continue to monitor BMP  Deconditioning -likely secondary to the above -PT and OT recommended SNF  DVT Prophylaxis  SCDs  Code Status: Full  Family Communication: None at bedside  Disposition Plan: Admitted, pending improvement in abdominal pain. Dispo TBD  Consultants PCCM Gastroenterology  Procedures  EGD  Antibiotics   Anti-infectives (From admission, onward)   Start     Dose/Rate Route Frequency Ordered Stop   10/23/19 0500  cefTRIAXone (ROCEPHIN) 2 g in sodium chloride 0.9 % 100 mL IVPB     2 g 200 mL/hr over 30 Minutes Intravenous Every 24 hours 10/23/19 0453  Subjective:   Harim Bi seen and examined today. Continues to have abdominal pain and feels weak. Denies chest pain, shortness of breath, dizziness or headache, nausea or vomiting.  Patient states that he has  had dark green bowel movement.  Objective:   Vitals:   10/25/19 2341 10/26/19 0415 10/26/19 0800 10/26/19 1049  BP: 93/73 102/76 90/67 96/78   Pulse: 95 91 91 98  Resp: 20 19  (!) 24  Temp: 98.5 F (36.9 C) 98.8 F (37.1 C) 98.6 F (37 C) 98 F (36.7 C)  TempSrc: Oral Oral Oral Oral  SpO2: 95% 95% 96% 95%  Weight:      Height:        Intake/Output Summary (Last 24 hours) at 10/26/2019 1405 Last data filed at 10/26/2019 1300 Gross per 24 hour  Intake 1185.63 ml  Output 1251 ml  Net -65.37 ml   Filed Weights   10/22/19 1837 10/24/19 0400 10/24/19 0941  Weight: 72.6 kg 72.7 kg 72.7 kg    Exam  General: Well developed, chronically ill appearing, NAD  HEENT: NCAT, mucous membranes moist.   Cardiovascular: S1 S2 auscultated, RRR  Respiratory: Clear to auscultation bilaterally   Abdomen: Soft, generalized TTP, nondistended, + bowel sounds  Extremities: warm dry without cyanosis clubbing or edema  Neuro: AAOx3, nonfocal  Psych: Appropriate mood and affect  Data Reviewed: I have personally reviewed following labs and imaging studies  CBC: Recent Labs  Lab 10/24/19 0030 10/24/19 0625 10/24/19 1234 10/24/19 1926 10/25/19 0253 10/26/19 0526  WBC 7.6 6.1 5.1 4.9 4.9 4.4  NEUTROABS 4.9 4.0 3.3 3.0  --  2.3  HGB 9.7* 9.4* 9.6* 10.3* 9.9* 10.1*  HCT 27.8* 27.5* 27.8* 29.3* 28.3* 29.5*  MCV 94.6 96.8 97.2 96.4 95.0 98.0  PLT 48* 52* 48* 49* 48* 61*   Basic Metabolic Panel: Recent Labs  Lab 10/23/19 0452 10/24/19 0625 10/24/19 1558 10/25/19 0253 10/25/19 0739 10/25/19 1615 10/26/19 0526  NA 127* 133*  --  132*  --  129* 133*  K 4.0 3.1*  --  2.7*  --  3.2* 3.1*  CL 94* 103  --  101  --  98 98  CO2 17* 20*  --  24  --  24 26  GLUCOSE 57* 89  --  113*  --  104* 87  BUN 17 17  --  10  --  5* <5*  CREATININE 0.67 0.66  --  0.54*  --  0.47* 0.46*  CALCIUM 7.2* 7.3*  --  7.4*  --  7.4* 7.5*  MG  --  1.8 1.7  --  1.6* 1.6* 1.7  PHOS  --  1.7* 2.7  --  1.8*  1.6* 2.2*   GFR: Estimated Creatinine Clearance: 103.1 mL/min (A) (by C-G formula based on SCr of 0.46 mg/dL (L)). Liver Function Tests: Recent Labs  Lab 10/22/19 1915 10/23/19 0452 10/25/19 0253 10/26/19 0526  AST 195* 141* 179*  --   ALT 60* 43 59*  --   ALKPHOS 191* 140* 123  --   BILITOT 4.5* 3.4* 4.1*  --   PROT 6.8 5.3* 5.0*  --   ALBUMIN 2.5* 1.9* 1.9* 1.8*   Recent Labs  Lab 10/25/19 0253  LIPASE 31  AMYLASE 39   Recent Labs  Lab 10/23/19 0036  AMMONIA 41*   Coagulation Profile: Recent Labs  Lab 10/23/19 0452 10/24/19 0625  INR 2.0* 1.7*   Cardiac Enzymes: No results for input(s): CKTOTAL, CKMB, CKMBINDEX, TROPONINI in the last  168 hours. BNP (last 3 results) No results for input(s): PROBNP in the last 8760 hours. HbA1C: No results for input(s): HGBA1C in the last 72 hours. CBG: Recent Labs  Lab 10/23/19 2001  GLUCAP 110*   Lipid Profile: No results for input(s): CHOL, HDL, LDLCALC, TRIG, CHOLHDL, LDLDIRECT in the last 72 hours. Thyroid Function Tests: No results for input(s): TSH, T4TOTAL, FREET4, T3FREE, THYROIDAB in the last 72 hours. Anemia Panel: No results for input(s): VITAMINB12, FOLATE, FERRITIN, TIBC, IRON, RETICCTPCT in the last 72 hours. Urine analysis:    Component Value Date/Time   COLORURINE YELLOW 03/23/2019 1606   APPEARANCEUR CLEAR 03/23/2019 1606   LABSPEC 1.010 03/23/2019 1606   PHURINE 8.5 (H) 03/23/2019 1606   GLUCOSEU NEGATIVE 03/23/2019 1606   HGBUR NEGATIVE 03/23/2019 1606   BILIRUBINUR small 04/18/2019 0956   KETONESUR NEGATIVE 03/23/2019 1606   PROTEINUR Positive (A) 04/18/2019 0956   PROTEINUR NEGATIVE 03/23/2019 1606   UROBILINOGEN 0.2 04/18/2019 0956   NITRITE neg 04/18/2019 0956   NITRITE NEGATIVE 03/23/2019 1606   LEUKOCYTESUR Negative 04/18/2019 0956   LEUKOCYTESUR NEGATIVE 03/23/2019 1606   Sepsis Labs: @LABRCNTIP (procalcitonin:4,lacticidven:4)  ) Recent Results (from the past 240 hour(s))  SARS  CORONAVIRUS 2 (TAT 6-24 HRS) Nasopharyngeal Nasopharyngeal Swab     Status: None   Collection Time: 10/23/19  6:50 AM   Specimen: Nasopharyngeal Swab  Result Value Ref Range Status   SARS Coronavirus 2 NEGATIVE NEGATIVE Final    Comment: (NOTE) SARS-CoV-2 target nucleic acids are NOT DETECTED. The SARS-CoV-2 RNA is generally detectable in upper and lower respiratory specimens during the acute phase of infection. Negative results do not preclude SARS-CoV-2 infection, do not rule out co-infections with other pathogens, and should not be used as the sole basis for treatment or other patient management decisions. Negative results must be combined with clinical observations, patient history, and epidemiological information. The expected result is Negative. Fact Sheet for Patients: 12/21/19 Fact Sheet for Healthcare Providers: HairSlick.no This test is not yet approved or cleared by the quierodirigir.com FDA and  has been authorized for detection and/or diagnosis of SARS-CoV-2 by FDA under an Emergency Use Authorization (EUA). This EUA will remain  in effect (meaning this test can be used) for the duration of the COVID-19 declaration under Section 56 4(b)(1) of the Act, 21 U.S.C. section 360bbb-3(b)(1), unless the authorization is terminated or revoked sooner. Performed at [facility], [address], [place], [place]       Radiology Studies: DG Abd Portable 1V  Result Date: 10/25/2019 CLINICAL DATA:  59 year old male with abdominal pain. EXAM: PORTABLE ABDOMEN - 1 VIEW COMPARISON:  Abdominal radiograph dated 04/03/2019 and CT dated 09/15/2019 FINDINGS: Evaluation is limited due to body habitus. There is no bowel dilatation or evidence of obstruction. Air is noted within the colon. No free air or radiopaque calculi identified. Right upper quadrant cholecystectomy clips. The osseous structures and soft tissues are  grossly unremarkable. IMPRESSION: No evidence of bowel obstruction. No free air. Electronically Signed   By: 09/17/2019 M.D.   On: 10/25/2019 02:31     Scheduled Meds: . Chlorhexidine Gluconate Cloth  6 each Topical Daily  . feeding supplement  1 Container Oral TID BM  . folic acid  1 mg Oral Daily  . Gerhardt's butt cream   Topical TID  . mouth rinse  15 mL Mouth Rinse BID  . multivitamin with minerals  1 tablet Oral Daily  . pantoprazole  40 mg Intravenous  Q12H  . phosphorus  500 mg Oral BID  . sucralfate  1 g Oral TID WC & HS  . thiamine  100 mg Oral Daily   Or  . thiamine  100 mg Intravenous Daily   Continuous Infusions: . cefTRIAXone (ROCEPHIN)  IV 2 g (10/26/19 0443)     LOS: 3 days   Time Spent in minutes   45 minutes  Missael Ferrari D.O. on 10/26/2019 at 2:05 PM  Between 7am to 7pm - Please see pager noted on [email or web address]  After 7pm go to [email or web address]  And look for the night coverage person covering for me after hours  [facility] Office  [phone]

## 2019-10-26 NOTE — Progress Notes (Signed)
Initial Nutrition Assessment  DOCUMENTATION CODES:   Severe malnutrition in context of chronic illness  INTERVENTION:   Ensure Enlive po TID, each supplement provides 350 kcal and 20 grams of protein  Encourage PO intake   Check Vitamin C and Zinc labs  NUTRITION DIAGNOSIS:   Severe Malnutrition related to chronic illness(cirrhosis) as evidenced by severe fat depletion, severe muscle depletion.  GOAL:   Patient will meet greater than or equal to 90% of their needs  MONITOR:   PO intake, Supplement acceptance, Labs  REASON FOR ASSESSMENT:   Malnutrition Screening Tool    ASSESSMENT:   Pt with PMH of ongoing ETOH abuse (40-80 oz beer daily), liver cirrhosis with varices and DVTs who lives with sister per review pt has had 2-3 weeks of weakness, falls, and rectal bleeding.   1/7 EGD reveals small esophageal and small proximal gastric varices with tear  Pt report abd pain since June after his gallbladder surgery. He reports that he has only been drinking water and ETOH during this time. He has had nausea and bloody stools last couple of weeks.  Pt has no running water at home and states this is why his skin is so dry. Notified CM.   Medications reviewed and include: folic acid, MVI, Kphos, carafate, thiamine  Labs reviewed: Na 133 (L), K+ 3.1 (L), PO4: 2.2 (L), Magnesium now WNL     NUTRITION - FOCUSED PHYSICAL EXAM:    Most Recent Value  Orbital Region  Severe depletion  Upper Arm Region  Severe depletion  Thoracic and Lumbar Region  Severe depletion  Buccal Region  Unable to assess  Temple Region  Severe depletion  Clavicle Bone Region  Severe depletion  Clavicle and Acromion Bone Region  Severe depletion  Scapular Bone Region  Severe depletion  Dorsal Hand  Severe depletion  Patellar Region  Moderate depletion  Anterior Thigh Region  Moderate depletion [L more depleted that the R]  Posterior Calf Region  Mild depletion  Edema (RD Assessment)  None [abd:  ascites]  Hair  Reviewed  Eyes  Reviewed  Mouth  Reviewed  Skin  Reviewed [extremely dry]  Nails  Reviewed       Diet Order:   Diet Order            Diet full liquid Room service appropriate? Yes; Fluid consistency: Thin  Diet effective now              EDUCATION NEEDS:   Education needs have been addressed  Skin:  Skin Assessment: Skin Integrity Issues: Skin Integrity Issues:: Stage I, Other (Comment)(MASD: perineum, buttocks) Stage I: L Ischial tuberosity Other: Open wound R/L buttocks  Last BM:  1/7 small  Height:   Ht Readings from Last 1 Encounters:  10/24/19 5\' 9"  (1.753 m)    Weight:   Wt Readings from Last 1 Encounters:  10/24/19 72.7 kg    Ideal Body Weight:  72.7 kg  BMI:  Body mass index is 23.67 kg/m.  Estimated Nutritional Needs:   Kcal:  2200-2400  Protein:  100-125 grams  Fluid:  2 L/day  12/22/19 RD, LDN, CNSC [phone] Pager [phone] After Hours Pager

## 2019-10-26 NOTE — Evaluation (Signed)
Occupational Therapy Evaluation Patient Details Name: Tom Bender MRN: [id number] DOB: 10/29/1960 Today's Date: 10/26/2019    History of Present Illness Pt is 59 year old male with PMH significant for alcohol abuse that is continuous, liver cirrhosis with varices and DVTs.  Patient was admitted with hematochezia and dark blood tinged emesis.  Pt s/p EGD revealed small esophageal and query small proximal gastric varices with mild lower Weiss tear.  Hgb is 9.9.  Potassium is 3.2.   Clinical Impression   Patient is a 59 year old male that lives with family, patient lives upstairs and sister lives downstairs. Patient reports he is supposed to be helping his sister but he has been unable to. The past 2 weeks patient has had increased weakness and difficulty caring for himself. Currently patient is min A for bed mobility and mod A for squat pivot transfer to recliner with decreased safety awareness and carry over of verbal cues. Would currently recommend assist of 2 if progressing beyond transfers at edge of bed. Pt BP in bed 99/80, seated EOB 96/76, after chair transfer 96/78. Patient did report some dizziness with mobility but with increased time and rest breaks able to tolerate OOB activity. Recommend continued acute OT services due to deficits listed below in order to maximize patient independence with self care.    Follow Up Recommendations  SNF;Supervision/Assistance - 24 hour    Equipment Recommendations  Other (comment)(defer to next venue)       Precautions / Restrictions Precautions Precautions: Fall Precaution Comments: monitor BP Restrictions Weight Bearing Restrictions: No      Mobility Bed Mobility Overal bed mobility: Needs Assistance Bed Mobility: Supine to Sit     Supine to sit: Min assist;HOB elevated     General bed mobility comments: increased time, use of bed rails  Transfers Overall transfer level: Needs assistance Equipment used: 1 person hand held  assist Transfers: Squat Pivot Transfers     Squat pivot transfers: Mod assist     General transfer comment: decreased safety and carry over for verbal cues    Balance Overall balance assessment: Needs assistance Sitting-balance support: Bilateral upper extremity supported;Feet supported Sitting balance-Leahy Scale: Fair     Standing balance support: Single extremity supported;During functional activity Standing balance-Leahy Scale: Poor Standing balance comment: unable to come to full stand, decreased strength                           ADL either performed or assessed with clinical judgement   ADL Overall ADL's : Needs assistance/impaired Eating/Feeding: Set up;Sitting;Bed level   Grooming: Set up;Sitting;Bed level   Upper Body Bathing: Set up;Sitting;Bed level   Lower Body Bathing: Moderate assistance;Sit to/from stand;Sitting/lateral leans   Upper Body Dressing : Set up;Sitting;Bed level   Lower Body Dressing: Moderate assistance;Sitting/lateral leans   Toilet Transfer: Moderate assistance;Squat-pivot;Cueing for sequencing;Cueing for safety;BSC Toilet Transfer Details (indicate cue type and reason): simulated to recliner, decreased carry over of verbal cues to sequence body mechanics Toileting- Clothing Manipulation and Hygiene: Maximal assistance;Sitting/lateral lean;Sit to/from stand       Functional mobility during ADLs: Moderate assistance;Cueing for safety;Cueing for sequencing General ADL Comments: pt requiring increased assistance with ADLs due to decreased strength, activity tolerance, safety awareness.                  Pertinent Vitals/Pain Pain Assessment: Faces Faces Pain Scale: Hurts even more Pain Location: stomach, back Pain Descriptors / Indicators: Discomfort Pain Intervention(s): Limited activity  within patient's tolerance;Monitored during session     Hand Dominance Right   Extremity/Trunk Assessment Upper Extremity  Assessment Upper Extremity Assessment: Generalized weakness   Lower Extremity Assessment Lower Extremity Assessment: Defer to PT evaluation   Cervical / Trunk Assessment Cervical / Trunk Assessment: Normal   Communication Communication Communication: No difficulties   Cognition Arousal/Alertness: Awake/alert Behavior During Therapy: Flat affect Overall Cognitive Status: No family/caregiver present to determine baseline cognitive functioning Area of Impairment: Safety/judgement;Following commands;Problem solving                       Following Commands: Follows one step commands inconsistently;Follows one step commands with increased time Safety/Judgement: Decreased awareness of safety   Problem Solving: Slow processing;Difficulty sequencing;Requires verbal cues;Requires tactile cues     General Comments  Patient BP negative for orthostatic hypotension this session, remained mid 90s over high 70s to low 80s. pt reported some dizziness but tolerate sitting up in chair at end of session            Home Living Family/patient expects to be discharged to:: Private residence Living Arrangements: Other relatives Available Help at Discharge: Family Type of Home: House Home Access: Stairs to enter Secretary/administrator of Steps: 4 Entrance Stairs-Rails: Right Home Layout: Two level Alternate Level Stairs-Number of Steps: 14 Alternate Level Stairs-Rails: Can reach both;Right;Left Bathroom Shower/Tub: Chief Strategy Officer: Standard Bathroom Accessibility: Yes How Accessible: Accessible via walker Home Equipment: None          Prior Functioning/Environment Level of Independence: Needs assistance  Gait / Transfers Assistance Needed: Prior to last 2 weeks pt could walk without AD but "wobbly"; reports gradual weakness over last 2 weeks to point of not walking ADL's / Homemaking Assistance Needed: Prior to past 2 weeks pt was independent with ADL    Comments: pt reports he is supposed to be taking care of his sister "but I just can't"        OT Problem List: Decreased strength;Decreased activity tolerance;Impaired balance (sitting and/or standing);Decreased cognition;Decreased safety awareness;Decreased knowledge of use of DME or AE;Pain      OT Treatment/Interventions: Self-care/ADL training;Therapeutic exercise;DME and/or AE instruction;Therapeutic activities;Cognitive remediation/compensation;Patient/family education;Balance training    OT Goals(Current goals can be found in the care plan section) Acute Rehab OT Goals Patient Stated Goal: feel better OT Goal Formulation: With patient Time For Goal Achievement: 11/09/19 Potential to Achieve Goals: Good  OT Frequency: Min 2X/week    AM-PAC OT "6 Clicks" Daily Activity     Outcome Measure Help from another person eating meals?: A Little Help from another person taking care of personal grooming?: A Little Help from another person toileting, which includes using toliet, bedpan, or urinal?: A Lot Help from another person bathing (including washing, rinsing, drying)?: A Lot Help from another person to put on and taking off regular upper body clothing?: A Little Help from another person to put on and taking off regular lower body clothing?: A Lot 6 Click Score: 15   End of Session Nurse Communication: Mobility status  Activity Tolerance: Patient tolerated treatment well Patient left: in chair;with call bell/phone within reach;with chair alarm set  OT Visit Diagnosis: Unsteadiness on feet (R26.81);Other abnormalities of gait and mobility (R26.89);History of falling (Z91.81);Muscle weakness (generalized) (M62.81)                Time: 3810-1751 OT Time Calculation (min): 30 min Charges:  OT General Charges $OT Visit: 1 Visit OT Evaluation $OT  Eval Moderate Complexity: 1 Mod OT Treatments $Self Care/Home Management : 8-22 mins  Myrtie Neither OT OT office: [phone]  Carmelia Roller 10/26/2019, 11:14 AM

## 2019-10-26 NOTE — Progress Notes (Signed)
   10/26/19 2350  Vitals  BP (!) 89/71  MAP (mmHg) 79   Pt 0000 VS noted- pt is drowsy (given ambien earlier) but easily arousable and denies complaints. MAP 79. Will continue to closely monitor.

## 2019-10-27 DIAGNOSIS — E43 Unspecified severe protein-calorie malnutrition: Secondary | ICD-10-CM | POA: Insufficient documentation

## 2019-10-27 LAB — COMPREHENSIVE METABOLIC PANEL
ALT: 66 U/L — ABNORMAL HIGH (ref 0–44)
AST: 158 U/L — ABNORMAL HIGH (ref 15–41)
Albumin: 1.8 g/dL — ABNORMAL LOW (ref 3.5–5.0)
Alkaline Phosphatase: 151 U/L — ABNORMAL HIGH (ref 38–126)
Anion gap: 9 (ref 5–15)
BUN: <5 mg/dL — ABNORMAL LOW (ref 6–20)
CO2: 24 mmol/L (ref 22–32)
Calcium: 7.6 mg/dL — ABNORMAL LOW (ref 8.9–10.3)
Chloride: 101 mmol/L (ref 98–111)
Creatinine, Ser: 0.54 mg/dL — ABNORMAL LOW (ref 0.61–1.24)
GFR calc Af Amer: >60 mL/min (ref >60)
GFR calc non Af Amer: >60 mL/min (ref >60)
Glucose, Bld: 99 mg/dL (ref 70–99)
Potassium: 3.2 mmol/L — ABNORMAL LOW (ref 3.5–5.1)
Sodium: 134 mmol/L — ABNORMAL LOW (ref 135–145)
Total Bilirubin: 3.8 mg/dL — ABNORMAL HIGH (ref 0.3–1.2)
Total Protein: 5.3 g/dL — ABNORMAL LOW (ref 6.5–8.1)

## 2019-10-27 LAB — CBC
HCT: 28.1 % — ABNORMAL LOW (ref 39.0–52.0)
Hemoglobin: 9.8 g/dL — ABNORMAL LOW (ref 13.0–17.0)
MCH: 33.8 pg (ref 26.0–34.0)
MCHC: 34.9 g/dL (ref 30.0–36.0)
MCV: 96.9 fL (ref 80.0–100.0)
Platelets: 70 10*3/uL — ABNORMAL LOW (ref 150–400)
RBC: 2.9 MIL/uL — ABNORMAL LOW (ref 4.22–5.81)
RDW: 19.7 % — ABNORMAL HIGH (ref 11.5–15.5)
WBC: 4.9 10*3/uL (ref 4.0–10.5)
nRBC: 0 % (ref 0.0–0.2)

## 2019-10-27 LAB — PHOSPHORUS: Phosphorus: 3.2 mg/dL (ref 2.5–4.6)

## 2019-10-27 LAB — MAGNESIUM: Magnesium: 1.5 mg/dL — ABNORMAL LOW (ref 1.7–2.4)

## 2019-10-27 MED ORDER — ALUM & MAG HYDROXIDE-SIMETH 200-200-20 MG/5ML PO SUSP
30.0000 mL | Freq: Once | ORAL | Status: AC
  Administered 2019-10-27: 30 mL via ORAL
  Filled 2019-10-27: qty 30

## 2019-10-27 MED ORDER — POTASSIUM CHLORIDE CRYS ER 20 MEQ PO TBCR
40.0000 meq | EXTENDED_RELEASE_TABLET | Freq: Once | ORAL | Status: AC
  Administered 2019-10-27: 40 meq via ORAL
  Filled 2019-10-27: qty 2

## 2019-10-27 MED ORDER — MAGNESIUM SULFATE 2 GM/50ML IV SOLN
2.0000 g | Freq: Once | INTRAVENOUS | Status: AC
  Administered 2019-10-27: 2 g via INTRAVENOUS
  Filled 2019-10-27 (×2): qty 50

## 2019-10-27 MED ORDER — LIDOCAINE VISCOUS HCL 2 % MT SOLN
15.0000 mL | Freq: Once | OROMUCOSAL | Status: AC
  Administered 2019-10-27: 15 mL via ORAL
  Filled 2019-10-27: qty 15

## 2019-10-27 NOTE — Progress Notes (Signed)
PROGRESS NOTE    Tom Bender  DVV:[id number] DOB: 11/07/1960 DOA: 10/22/2019 PCP: Patient, No Pcp Per   Brief Narrative:  HPI On 10/22/2018 by Dr. Lyda Perone Tom Bender is a 59 y.o. male with medical history significant of EtOH abuse that is ongoing, cirrhosis and varices, DTs.  Patient presents to the ED with c/o hematochezia.  He states that he has been having generalized abdominal pain since having gallbladder surgery last June. For the last 2-3 weeks, he has been having rectal bleeding. Stool is initially red and is now black. He has also had nausea and vomiting without any blood in the emesis. He feels generally weak and has been falling. He continues to consume alcohol and admits to drinking 40 ounces of beer today. He usually drinks 40-80 ounces of beer a day. He denies other drug use. He denies exposure to COVID-19.  On further review.  Looks like patient was admitted in Oct and again in Nov at [facility] with reports of GIB.  EGD in Oct just showed small non-bleeding varicies, no GAVE.  Colonoscopy in Aug with polypectomy done after c/o rectal bleeding then as well and bleeding thought to be due to internal hemorrhoids.  In Nov he was admitted for reported bleeding and initial S.Tachycardia.  Went into withdrawals the next day.  Was hemoccult positive then, though HGB remained stable so they didn't scope him.  Was found to have bacterial PNA during that admit.  Interim history Patient admitted with GI bleed.  He was given FFP, packed red blood cells, IV fluids, as he was noted to be hypotensive.  Patient was admitted to ICU.  Gastroenterology consulted, status post EGD. Continues to have abdominal pain, will add on GI cocktail with lidocaine. Assessment & Plan   Hypovolemic shock -Secondary to hemorrhagic cause, GI losses as well as poor hydration and alcohol use -Patient did have lactic acidosis but however this resolved with IV fluid resuscitation -Was given IV fluids as  well as packed red blood cells  GI bleed/hematemesis, melena/acute blood loss anemia/Symptomatic anemia -Hemoglobin dropped to 8.4, currently 9.8 -Transfused 1u FFP, 2uPRBC -EGD showed small esophageal varices without stigmata.  Small Mallory-Weiss tear without stigmata-which explains coffee-ground emesis.  Possible small proximal gastric varices.  Mild portal gastropathy.  Alcoholic hepatitis.  Chronic alcoholism.  Recommended to stop octreotide, continue PPI.  Advance diet as tolerated.  Alcohol cessation.  Patient to follow-up with his primary gastroenterologist in [facility] Dr. Marcelene [place]te. -Patient complains of feeling weak  Abdominal pain  -Likely secondary to the above -Continue pain control -Continue sucralfate -Continues to feel pain, will add on GI cocktail with lidocaine  Hepatic cirrhosis secondary to alcohol use disorder -On CIWA -needs alcohol counseling  Thrombocytopenia secondary to liver disease -platelets are stable, currently 70 -Continue to monitor CBC  Hypokalemia -Continue to replace and monitor  Hypomagnesemia -magnesium 1.5, will replace and continue to monitor  Hypophosphatemia -Resolved with replacement, continue to monitor  Hyponatremia -Sodium currently 134 -Continue to monitor BMP  Deconditioning -likely secondary to the above -PT and OT recommended SNF -TOC consulted  DVT Prophylaxis  SCDs  Code Status: Full  Family Communication: None at bedside  Disposition Plan: Admitted, pending improvement in abdominal pain. Dispo TBD-TOC consulted and pending  Consultants PCCM Gastroenterology  Procedures  EGD  Antibiotics   Anti-infectives (From admission, onward)   Start     Dose/Rate Route Frequency Ordered Stop   10/23/19 0500  cefTRIAXone (ROCEPHIN) 2 g in sodium chloride 0.9 %  100 mL IVPB     2 g 200 mL/hr over 30 Minutes Intravenous Every 24 hours 10/23/19 0453        Subjective:   Tom Bender seen and examined today.   Continues have abdominal pain and nausea.  Denies any weakness.  Is having bowel movements.  Feels very weak.  Denies current chest pain or shortness of breath, dizziness or headache.  Objective:   Vitals:   10/26/19 2350 10/27/19 0415 10/27/19 0502 10/27/19 0811  BP: (!) 89/71 100/74 100/74 90/67  Pulse: 86 91 90 95  Resp: 16 17  20   Temp: 98.1 F (36.7 C)   98.4 F (36.9 C)  TempSrc: Oral Oral  Oral  SpO2: 92% 96%  93%  Weight:      Height:        Intake/Output Summary (Last 24 hours) at 10/27/2019 0943 Last data filed at 10/27/2019 0440 Gross per 24 hour  Intake 580 ml  Output 701 ml  Net -121 ml   Filed Weights   10/22/19 1837 10/24/19 0400 10/24/19 0941  Weight: 72.6 kg 72.7 kg 72.7 kg   Exam  General: Well developed, chronically ill-appearing, NAD  HEENT: NCAT, mucous membranes moist.   Cardiovascular: S1 S2 auscultated, RRR  Respiratory: Clear to auscultation bilaterally with equal chest rise  Abdomen: Soft, generalized TTP, mildly distended, positive bowel sounds  Extremities: warm dry without cyanosis clubbing or edema  Neuro: AAOx3, nonfocal  Psych: Appropriate mood and affect  Data Reviewed: I have personally reviewed following labs and imaging studies  CBC: Recent Labs  Lab 10/24/19 0030 10/24/19 0625 10/24/19 1234 10/24/19 1926 10/25/19 0253 10/26/19 0526 10/27/19 0123  WBC 7.6 6.1 5.1 4.9 4.9 4.4 4.9  NEUTROABS 4.9 4.0 3.3 3.0  --  2.3  --   HGB 9.7* 9.4* 9.6* 10.3* 9.9* 10.1* 9.8*  HCT 27.8* 27.5* 27.8* 29.3* 28.3* 29.5* 28.1*  MCV 94.6 96.8 97.2 96.4 95.0 98.0 96.9  PLT 48* 52* 48* 49* 48* 61* 70*   Basic Metabolic Panel: Recent Labs  Lab 10/24/19 0625 10/24/19 1558 10/25/19 0253 10/25/19 0739 10/25/19 1615 10/26/19 0526 10/27/19 0123  NA 133*  --  132*  --  129* 133* 134*  K 3.1*  --  2.7*  --  3.2* 3.1* 3.2*  CL 103  --  101  --  98 98 101  CO2 20*  --  24  --  24 26 24   GLUCOSE 89  --  113*  --  104* 87 99  BUN 17  --  10   --  5* <5* <5*  CREATININE 0.66  --  0.54*  --  0.47* 0.46* 0.54*  CALCIUM 7.3*  --  7.4*  --  7.4* 7.5* 7.6*  MG 1.8 1.7  --  1.6* 1.6* 1.7 1.5*  PHOS 1.7* 2.7  --  1.8* 1.6* 2.2* 3.2   GFR: Estimated Creatinine Clearance: 103.1 mL/min (A) (by C-G formula based on SCr of 0.54 mg/dL (L)). Liver Function Tests: Recent Labs  Lab 10/22/19 1915 10/23/19 0452 10/25/19 0253 10/26/19 0526 10/27/19 0123  AST 195* 141* 179*  --  158*  ALT 60* 43 59*  --  66*  ALKPHOS 191* 140* 123  --  151*  BILITOT 4.5* 3.4* 4.1*  --  3.8*  PROT 6.8 5.3* 5.0*  --  5.3*  ALBUMIN 2.5* 1.9* 1.9* 1.8* 1.8*   Recent Labs  Lab 10/25/19 0253  LIPASE 31  AMYLASE 39   Recent Labs  Lab 10/23/19 0036  AMMONIA 41*   Coagulation Profile: Recent Labs  Lab 10/23/19 0452 10/24/19 0625  INR 2.0* 1.7*   Cardiac Enzymes: No results for input(s): CKTOTAL, CKMB, CKMBINDEX, TROPONINI in the last 168 hours. BNP (last 3 results) No results for input(s): PROBNP in the last 8760 hours. HbA1C: No results for input(s): HGBA1C in the last 72 hours. CBG: Recent Labs  Lab 10/23/19 2001  GLUCAP 110*   Lipid Profile: No results for input(s): CHOL, HDL, LDLCALC, TRIG, CHOLHDL, LDLDIRECT in the last 72 hours. Thyroid Function Tests: No results for input(s): TSH, T4TOTAL, FREET4, T3FREE, THYROIDAB in the last 72 hours. Anemia Panel: No results for input(s): VITAMINB12, FOLATE, FERRITIN, TIBC, IRON, RETICCTPCT in the last 72 hours. Urine analysis:    Component Value Date/Time   COLORURINE YELLOW 03/23/2019 1606   APPEARANCEUR CLEAR 03/23/2019 1606   LABSPEC 1.010 03/23/2019 1606   PHURINE 8.5 (H) 03/23/2019 1606   GLUCOSEU NEGATIVE 03/23/2019 1606   HGBUR NEGATIVE 03/23/2019 1606   BILIRUBINUR small 04/18/2019 0956   KETONESUR NEGATIVE 03/23/2019 1606   PROTEINUR Positive (A) 04/18/2019 0956   PROTEINUR NEGATIVE 03/23/2019 1606   UROBILINOGEN 0.2 04/18/2019 0956   NITRITE neg 04/18/2019 0956   NITRITE  NEGATIVE 03/23/2019 1606   LEUKOCYTESUR Negative 04/18/2019 0956   LEUKOCYTESUR NEGATIVE 03/23/2019 1606   Sepsis Labs: @LABRCNTIP (procalcitonin:4,lacticidven:4)  ) Recent Results (from the past 240 hour(s))  SARS CORONAVIRUS 2 (TAT 6-24 HRS) Nasopharyngeal Nasopharyngeal Swab     Status: None   Collection Time: 10/23/19  6:50 AM   Specimen: Nasopharyngeal Swab  Result Value Ref Range Status   SARS Coronavirus 2 NEGATIVE NEGATIVE Final    Comment: (NOTE) SARS-CoV-2 target nucleic acids are NOT DETECTED. The SARS-CoV-2 RNA is generally detectable in upper and lower respiratory specimens during the acute phase of infection. Negative results do not preclude SARS-CoV-2 infection, do not rule out co-infections with other pathogens, and should not be used as the sole basis for treatment or other patient management decisions. Negative results must be combined with clinical observations, patient history, and epidemiological information. The expected result is Negative. Fact Sheet for Patients: 12/21/19 Fact Sheet for Healthcare Providers: HairSlick.no This test is not yet approved or cleared by the quierodirigir.com FDA and  has been authorized for detection and/or diagnosis of SARS-CoV-2 by FDA under an Emergency Use Authorization (EUA). This EUA will remain  in effect (meaning this test can be used) for the duration of the COVID-19 declaration under Section 56 4(b)(1) of the Act, 21 U.S.C. section 360bbb-3(b)(1), unless the authorization is terminated or revoked sooner. Performed at [facility], [address], [place], [place]       Radiology Studies: No results found.   Scheduled Meds: . Chlorhexidine Gluconate Cloth  6 each Topical Daily  . feeding supplement (ENSURE ENLIVE)  237 mL Oral TID BM  . folic acid  1 mg Oral Daily  . Gerhardt's butt cream   Topical TID  . mouth rinse  15 mL Mouth Rinse  BID  . multivitamin with minerals  1 tablet Oral Daily  . pantoprazole  40 mg Intravenous Q12H  . phosphorus  500 mg Oral BID  . sucralfate  1 g Oral TID WC & HS  . thiamine  100 mg Oral Daily   Or  . thiamine  100 mg Intravenous Daily   Continuous Infusions: . cefTRIAXone (ROCEPHIN)  IV 2 g (10/27/19 0440)     LOS: 4 days  Time Spent in minutes   45 minutes  Ludella Pranger D.O. on 10/27/2019 at 9:43 AM  Between 7am to 7pm - Please see pager noted on [email or web address]  After 7pm go to [email or web address]  And look for the night coverage person covering for me after hours  [facility] Office  [phone]

## 2019-10-28 LAB — CBC
HCT: 29.1 % — ABNORMAL LOW (ref 39.0–52.0)
Hemoglobin: 10.2 g/dL — ABNORMAL LOW (ref 13.0–17.0)
MCH: 34.3 pg — ABNORMAL HIGH (ref 26.0–34.0)
MCHC: 35.1 g/dL (ref 30.0–36.0)
MCV: 98 fL (ref 80.0–100.0)
Platelets: 84 10*3/uL — ABNORMAL LOW (ref 150–400)
RBC: 2.97 MIL/uL — ABNORMAL LOW (ref 4.22–5.81)
RDW: 20.7 % — ABNORMAL HIGH (ref 11.5–15.5)
WBC: 5.2 10*3/uL (ref 4.0–10.5)
nRBC: 0 % (ref 0.0–0.2)

## 2019-10-28 LAB — MAGNESIUM: Magnesium: 1.9 mg/dL (ref 1.7–2.4)

## 2019-10-28 LAB — BASIC METABOLIC PANEL
Anion gap: 7 (ref 5–15)
BUN: 5 mg/dL — ABNORMAL LOW (ref 6–20)
CO2: 28 mmol/L (ref 22–32)
Calcium: 7.9 mg/dL — ABNORMAL LOW (ref 8.9–10.3)
Chloride: 98 mmol/L (ref 98–111)
Creatinine, Ser: 0.5 mg/dL — ABNORMAL LOW (ref 0.61–1.24)
GFR calc Af Amer: >60 mL/min (ref >60)
GFR calc non Af Amer: >60 mL/min (ref >60)
Glucose, Bld: 107 mg/dL — ABNORMAL HIGH (ref 70–99)
Potassium: 3.4 mmol/L — ABNORMAL LOW (ref 3.5–5.1)
Sodium: 133 mmol/L — ABNORMAL LOW (ref 135–145)

## 2019-10-28 LAB — PHOSPHORUS: Phosphorus: 2.9 mg/dL (ref 2.5–4.6)

## 2019-10-28 MED ORDER — FAMOTIDINE 20 MG PO TABS
20.0000 mg | ORAL_TABLET | Freq: Two times a day (BID) | ORAL | Status: DC
  Administered 2019-10-28 – 2019-11-15 (×37): 20 mg via ORAL
  Filled 2019-10-28 (×37): qty 1

## 2019-10-28 MED ORDER — LORAZEPAM 2 MG/ML IJ SOLN
1.0000 mg | Freq: Once | INTRAMUSCULAR | Status: AC
  Administered 2019-10-28: 1 mg via INTRAVENOUS
  Filled 2019-10-28: qty 1

## 2019-10-28 MED ORDER — POTASSIUM CHLORIDE CRYS ER 20 MEQ PO TBCR
40.0000 meq | EXTENDED_RELEASE_TABLET | Freq: Once | ORAL | Status: AC
  Administered 2019-10-28: 10:00:00 40 meq via ORAL
  Filled 2019-10-28: qty 2

## 2019-10-28 MED ORDER — LORAZEPAM 2 MG/ML IJ SOLN
0.5000 mg | Freq: Four times a day (QID) | INTRAMUSCULAR | Status: DC | PRN
  Administered 2019-10-31 – 2019-11-06 (×3): 0.5 mg via INTRAVENOUS
  Filled 2019-10-28 (×4): qty 1

## 2019-10-28 MED ORDER — LIDOCAINE VISCOUS HCL 2 % MT SOLN
15.0000 mL | OROMUCOSAL | Status: DC | PRN
  Administered 2019-11-04 – 2019-11-13 (×7): 15 mL via ORAL
  Filled 2019-10-28 (×10): qty 15

## 2019-10-28 MED ORDER — ALUM & MAG HYDROXIDE-SIMETH 200-200-20 MG/5ML PO SUSP
30.0000 mL | ORAL | Status: DC | PRN
  Administered 2019-11-11: 30 mL via ORAL
  Filled 2019-10-28: qty 30

## 2019-10-28 NOTE — TOC Initial Note (Signed)
Transition of Care [facility]) - Initial/Assessment Note    Patient Details  Name: Tom Bender MRN: [id number] Date of Birth: 06-30-1961  Transition of Care [facility]) CM/SW Contact:    Vinie Sill, [place] Phone Number: 10/28/2019, 12:19 PM  Clinical Narrative:                  CSW met with the patient at bedside. CSW introduced self and explained role. CSW discuss PT recommendation of ST rehab at [facility]. Patient states lives in the home with his sister, Tom Bender. Patient states she is has some health issues and is in a wheel chair. Patient states both of his parent are deceased and has  limited family support. He has a cousin, Tom Bender, but he is the caregiver of his aunts. Patient states he is agreeable to ST rehab at SNF because he wants to get better. CSW explained the SNF process and the possible placement barrier with no insurance. Patient states he understands. Patient shared he was concerned about his sister health.  CSW discussed with the patient ETOH. Patient states he knows he needs to stop drinking. Patient states he has been to [facility] before and is willing to return for treatment. CSW provided the patient with  SA, ETOH, MH inpatient and out patient treatment resources. Patient denies drug use. Patient shared he has limited reading ability and poor eye sight. Patient states concern about being about being able to independently manage any home medications. Patient states "I rode the short bus" and sometimes can not understand all those medications.   CSW spoke with the patient's sister, Tom Bender. She shared she was currently in [facility]. She expressed she may be going to rehab herself at discharged. She states she has a "ggod friend" that come to check on her and assist with her care. Including, she has visited her there at the hospital. She states she hopes her brother is able to get reahab at [facility] and get treatment for ETOH after.   Patient and family states no questions  at this time. CSW will continue to follow and seek placement but CSW advised possibility of going home if medically stable before placement is secured.   [name], MSW, [place] Clinical Social Worker   Expected Discharge Plan: Skilled Nursing Facility Barriers to Discharge: Inadequate or no insurance, Continued Medical Work up, SNF Pending bed offer   Patient Goals and CMS Choice        Expected Discharge Plan and Services Expected Discharge Plan: [place] arrangements for the past 2 months: Single Family Home                                      Prior Living Arrangements/Services Living arrangements for the past 2 months: Single Family Home Lives with:: Self, Siblings          Need for Family Participation in Patient Care: Yes (Comment) Care giver support system in place?: Yes (comment)   Criminal Activity/Legal Involvement Pertinent to Current Situation/Hospitalization: No - Comment as needed  Activities of Daily Living Home Assistive Devices/Equipment: Grab bars around toilet, Grab bars in shower ADL Screening (condition at time of admission) Patient's cognitive ability adequate to safely complete daily activities?: Yes Is the patient deaf or have difficulty hearing?: No Does the patient have difficulty seeing, even when wearing glasses/contacts?: No Does the  patient have difficulty concentrating, remembering, or making decisions?: No Patient able to express need for assistance with ADLs?: Yes Does the patient have difficulty dressing or bathing?: Yes Independently performs ADLs?: Yes (appropriate for developmental age) Does the patient have difficulty walking or climbing stairs?: Yes Weakness of Legs: Both Weakness of Arms/Hands: Both  Permission Sought/Granted Permission sought to share information with : Family Supports, Customer service manager, Case Manager Permission granted to share information with : Yes,  Verbal Permission Granted  Share Information with NAME: Tom Bender [phone]  Permission granted to share info w AGENCY: SNFs  Permission granted to share info w Relationship: sister  Permission granted to share info w Contact Information: [phone]  Emotional Assessment Appearance:: Appears stated age Attitude/Demeanor/Rapport: Engaged Affect (typically observed): Accepting, Other (comment)(concerns about is sister) Orientation: : Oriented to Self, Oriented to Place, Oriented to  Time, Oriented to Situation Alcohol / Substance Use: Not Applicable Psych Involvement: No (comment)  Admission diagnosis:  Sinus tachycardia [R00.0] Hyponatremia [E87.1] Jaundice [R17] Tachycardia [R00.0] Thrombocytopenia (HCC) [D69.6] GI bleed [K92.2] Respiration abnormal [R06.9] Elevated liver function tests [R79.89] Alcohol intoxication, uncomplicated (HCC) [P80.998] Gastrointestinal hemorrhage, unspecified gastrointestinal hemorrhage type [K92.2] Patient Active Problem List   Diagnosis Date Noted  . Protein-calorie malnutrition, severe 10/27/2019  . Pressure injury of skin 10/24/2019  . Mallory-Weiss tear   . GI bleed 10/23/2019  . Alcoholic hepatitis 33/82/5053  . Sinus tachycardia 10/23/2019  . SIRS (systemic inflammatory response syndrome) ([place]) 03/23/2019  . Hyponatremia 03/23/2019  . Hypokalemia 03/23/2019  . Benign essential HTN 03/23/2019  . Alcohol abuse 03/23/2019  . Lactic acidosis 03/23/2019  . Pancreatic cyst 03/23/2019   PCP:  Patient, No Pcp Per Pharmacy:   [facility] [address] [place] - [place] [address] Phone: [phone] Fax: [phone]  [place], [place] - [address] [place] [address] Phone: [phone] Fax: [phone]  [facility] [place], [place] - [address] [place] [address] Phone:  [phone] Fax: [phone]     Social Determinants of Health (SDOH) Interventions    Readmission Risk Interventions No flowsheet data found.

## 2019-10-28 NOTE — Progress Notes (Addendum)
PROGRESS NOTE    Tom Bender  OIZ:[id number] DOB: 04-Aug-1961 DOA: 10/22/2019 PCP: Patient, No Pcp Per   Brief Narrative:  HPI On 10/22/2018 by Dr. Lyda Perone Tom Bender is a 59 y.o. male with medical history significant of EtOH abuse that is ongoing, cirrhosis and varices, DTs.  Patient presents to the ED with c/o hematochezia.  He states that he has been having generalized abdominal pain since having gallbladder surgery last June. For the last 2-3 weeks, he has been having rectal bleeding. Stool is initially red and is now black. He has also had nausea and vomiting without any blood in the emesis. He feels generally weak and has been falling. He continues to consume alcohol and admits to drinking 40 ounces of beer today. He usually drinks 40-80 ounces of beer a day. He denies other drug use. He denies exposure to COVID-19.  On further review.  Looks like patient was admitted in Oct and again in Nov at [facility] with reports of GIB.  EGD in Oct just showed small non-bleeding varicies, no GAVE.  Colonoscopy in Aug with polypectomy done after c/o rectal bleeding then as well and bleeding thought to be due to internal hemorrhoids.  In Nov he was admitted for reported bleeding and initial S.Tachycardia.  Went into withdrawals the next day.  Was hemoccult positive then, though HGB remained stable so they didn't scope him.  Was found to have bacterial PNA during that admit.  Interim history Patient admitted with GI bleed.  He was given FFP, packed red blood cells, IV fluids, as he was noted to be hypotensive.  Patient was admitted to ICU.  Gastroenterology consulted, status post EGD. Continues to have abdominal pain, added GI cocktail with lidocaine. Assessment & Plan   Hypovolemic shock -Secondary to hemorrhagic cause, GI losses as well as poor hydration and alcohol use -Patient did have lactic acidosis but however this resolved with IV fluid resuscitation -Was given IV fluids as well  as packed red blood cells  GI bleed/hematemesis, melena/acute blood loss anemia/Symptomatic anemia -Hemoglobin dropped to 8.4, currently 10.2 -Transfused 1u FFP, 2uPRBC -EGD showed small esophageal varices without stigmata.  Small Mallory-Weiss tear without stigmata-which explains coffee-ground emesis.  Possible small proximal gastric varices.  Mild portal gastropathy.  Alcoholic hepatitis.  Chronic alcoholism.  Recommended to stop octreotide, continue PPI.  Advance diet as tolerated.  Alcohol cessation.  Patient to follow-up with his primary gastroenterologist in [facility] Dr. Marcelene Butte. -Continues to complain of feeling weak  Abdominal pain  -Likely secondary to the above -Continue pain control -Continue sucralfate -Continues to feel pain, continue GI cocktail with lidocaine -will add on pepcid  Hepatic cirrhosis secondary to alcohol use disorder -On CIWA -needs alcohol counseling  Thrombocytopenia secondary to liver disease -platelets are stable, currently 84 -Continue to monitor CBC  Hypokalemia -Continue to replace and monitor  Hypomagnesemia -magnesium 1.9  Hypophosphatemia -Resolved with replacement, continue to monitor  Hyponatremia -Sodium currently 133 -Continue to monitor BMP  Deconditioning -likely secondary to the above -PT and OT recommended SNF -TOC consulted- may be a difficult placement given insurance  DVT Prophylaxis  SCDs  Code Status: Full  Family Communication: None at bedside  Disposition Plan: Admitted, pending improvement in abdominal pain. Dispo TBD-TOC consulted and pending  Consultants PCCM Gastroenterology  Procedures  EGD  Antibiotics   Anti-infectives (From admission, onward)   Start     Dose/Rate Route Frequency Ordered Stop   10/23/19 0500  cefTRIAXone (ROCEPHIN) 2 g in sodium chloride  0.9 % 100 mL IVPB     2 g 200 mL/hr over 30 Minutes Intravenous Every 24 hours 10/23/19 0453        Subjective:   Tom Bender  seen and examined today.  Continues to complain of abdominal pain and nausea.  States he is eating very slowly.  Denies chest pain or shortness of breath, dizziness or headache.  Does complain of feeling very weak.    Objective:   Vitals:   10/27/19 1931 10/27/19 2338 10/28/19 0510 10/28/19 1002  BP: (!) 86/71 90/70 91/65  92/62  Pulse: 93 99 97 (!) 117  Resp: 18 16 20 17   Temp: 98.2 F (36.8 C) 98.1 F (36.7 C) 98.1 F (36.7 C) 98.4 F (36.9 C)  TempSrc: Oral Oral Oral Oral  SpO2: 94% 94% 93% 95%  Weight:      Height:        Intake/Output Summary (Last 24 hours) at 10/28/2019 1015 Last data filed at 10/28/2019 0600 Gross per 24 hour  Intake 300 ml  Output 400 ml  Net -100 ml   Filed Weights   10/22/19 1837 10/24/19 0400 10/24/19 0941  Weight: 72.6 kg 72.7 kg 72.7 kg   Exam  General: Well developed, chronically ill-appearing, NAD  HEENT: NCAT, mucous membranes moist.   Cardiovascular: S1 S2 auscultated, RRR  Respiratory: Clear to auscultation bilaterally   Abdomen: Soft, generalized TTP, mildly distended, + bowel sounds  Extremities: warm dry without cyanosis clubbing or edema  Neuro: AAOx3, nonfocal  Psych: Appropriate mood and affect, pleasant   Data Reviewed: I have personally reviewed following labs and imaging studies  CBC: Recent Labs  Lab 10/24/19 0030 10/24/19 0625 10/24/19 1234 10/24/19 1926 10/25/19 0253 10/26/19 0526 10/27/19 0123 10/28/19 0106  WBC 7.6 6.1 5.1 4.9 4.9 4.4 4.9 5.2  NEUTROABS 4.9 4.0 3.3 3.0  --  2.3  --   --   HGB 9.7* 9.4* 9.6* 10.3* 9.9* 10.1* 9.8* 10.2*  HCT 27.8* 27.5* 27.8* 29.3* 28.3* 29.5* 28.1* 29.1*  MCV 94.6 96.8 97.2 96.4 95.0 98.0 96.9 98.0  PLT 48* 52* 48* 49* 48* 61* 70* 84*   Basic Metabolic Panel: Recent Labs  Lab 10/25/19 0253 10/25/19 0739 10/25/19 1615 10/26/19 0526 10/27/19 0123 10/28/19 0106  NA 132*  --  129* 133* 134* 133*  K 2.7*  --  3.2* 3.1* 3.2* 3.4*  CL 101  --  98 98 101 98  CO2 24   --  24 26 24 28   GLUCOSE 113*  --  104* 87 99 107*  BUN 10  --  5* <5* <5* 5*  CREATININE 0.54*  --  0.47* 0.46* 0.54* 0.50*  CALCIUM 7.4*  --  7.4* 7.5* 7.6* 7.9*  MG  --  1.6* 1.6* 1.7 1.5* 1.9  PHOS  --  1.8* 1.6* 2.2* 3.2 2.9   GFR: Estimated Creatinine Clearance: 103.1 mL/min (A) (by C-G formula based on SCr of 0.5 mg/dL (L)). Liver Function Tests: Recent Labs  Lab 10/22/19 1915 10/23/19 0452 10/25/19 0253 10/26/19 0526 10/27/19 0123  AST 195* 141* 179*  --  158*  ALT 60* 43 59*  --  66*  ALKPHOS 191* 140* 123  --  151*  BILITOT 4.5* 3.4* 4.1*  --  3.8*  PROT 6.8 5.3* 5.0*  --  5.3*  ALBUMIN 2.5* 1.9* 1.9* 1.8* 1.8*   Recent Labs  Lab 10/25/19 0253  LIPASE 31  AMYLASE 39   Recent Labs  Lab 10/23/19 0036  AMMONIA  41*   Coagulation Profile: Recent Labs  Lab 10/23/19 0452 10/24/19 0625  INR 2.0* 1.7*   Cardiac Enzymes: No results for input(s): CKTOTAL, CKMB, CKMBINDEX, TROPONINI in the last 168 hours. BNP (last 3 results) No results for input(s): PROBNP in the last 8760 hours. HbA1C: No results for input(s): HGBA1C in the last 72 hours. CBG: Recent Labs  Lab 10/23/19 2001  GLUCAP 110*   Lipid Profile: No results for input(s): CHOL, HDL, LDLCALC, TRIG, CHOLHDL, LDLDIRECT in the last 72 hours. Thyroid Function Tests: No results for input(s): TSH, T4TOTAL, FREET4, T3FREE, THYROIDAB in the last 72 hours. Anemia Panel: No results for input(s): VITAMINB12, FOLATE, FERRITIN, TIBC, IRON, RETICCTPCT in the last 72 hours. Urine analysis:    Component Value Date/Time   COLORURINE YELLOW 03/23/2019 1606   APPEARANCEUR CLEAR 03/23/2019 1606   LABSPEC 1.010 03/23/2019 1606   PHURINE 8.5 (H) 03/23/2019 1606   GLUCOSEU NEGATIVE 03/23/2019 1606   HGBUR NEGATIVE 03/23/2019 1606   BILIRUBINUR small 04/18/2019 0956   KETONESUR NEGATIVE 03/23/2019 1606   PROTEINUR Positive (A) 04/18/2019 0956   PROTEINUR NEGATIVE 03/23/2019 1606   UROBILINOGEN 0.2 04/18/2019  0956   NITRITE neg 04/18/2019 0956   NITRITE NEGATIVE 03/23/2019 1606   LEUKOCYTESUR Negative 04/18/2019 0956   LEUKOCYTESUR NEGATIVE 03/23/2019 1606   Sepsis Labs: @LABRCNTIP (procalcitonin:4,lacticidven:4)  ) Recent Results (from the past 240 hour(s))  SARS CORONAVIRUS 2 (TAT 6-24 HRS) Nasopharyngeal Nasopharyngeal Swab     Status: None   Collection Time: 10/23/19  6:50 AM   Specimen: Nasopharyngeal Swab  Result Value Ref Range Status   SARS Coronavirus 2 NEGATIVE NEGATIVE Final    Comment: (NOTE) SARS-CoV-2 target nucleic acids are NOT DETECTED. The SARS-CoV-2 RNA is generally detectable in upper and lower respiratory specimens during the acute phase of infection. Negative results do not preclude SARS-CoV-2 infection, do not rule out co-infections with other pathogens, and should not be used as the sole basis for treatment or other patient management decisions. Negative results must be combined with clinical observations, patient history, and epidemiological information. The expected result is Negative. Fact Sheet for Patients: 12/21/19 Fact Sheet for Healthcare Providers: HairSlick.no This test is not yet approved or cleared by the quierodirigir.com FDA and  has been authorized for detection and/or diagnosis of SARS-CoV-2 by FDA under an Emergency Use Authorization (EUA). This EUA will remain  in effect (meaning this test can be used) for the duration of the COVID-19 declaration under Section 56 4(b)(1) of the Act, 21 U.S.C. section 360bbb-3(b)(1), unless the authorization is terminated or revoked sooner. Performed at [facility], [address] [place], [place]       Radiology Studies: No results found.   Scheduled Meds: . Chlorhexidine Gluconate Cloth  6 each Topical Daily  . feeding supplement (ENSURE ENLIVE)  237 mL Oral TID BM  . folic acid  1 mg Oral Daily  . Gerhardt's butt cream    Topical TID  . mouth rinse  15 mL Mouth Rinse BID  . multivitamin with minerals  1 tablet Oral Daily  . pantoprazole  40 mg Intravenous Q12H  . potassium chloride  40 mEq Oral Once  . sucralfate  1 g Oral TID WC & HS  . thiamine  100 mg Oral Daily   Or  . thiamine  100 mg Intravenous Daily   Continuous Infusions: . cefTRIAXone (ROCEPHIN)  IV 2 g (10/28/19 0600)     LOS: 5 days   Time Spent  in minutes   45 minutes  Joud Ingwersen D.O. on 10/28/2019 at 10:15 AM  Between 7am to 7pm - Please see pager noted on [email or web address]  After 7pm go to [email or web address]  And look for the night coverage person covering for me after hours  [facility] Office  [phone]

## 2019-10-28 NOTE — NC FL2 (Signed)
[place] MEDICAID FL2 LEVEL OF CARE SCREENING TOOL     IDENTIFICATION  Patient Name: Tom Bender Birthdate: 1961-10-18 Sex: male Admission Date (Current Location): 10/22/2019  [facility] [place] Number:  Producer, television/film/video and Address:  The . [facility], [address], [place], [place] [address]      Provider Number: [id number]  Attending Physician Name and Address:  Edsel Petrin, DO  Relative Name and Phone Number:  Raynelle Fanning    Current Level of Care: Hospital Recommended Level of Care: Skilled Nursing Facility Prior Approval Number:    Date Approved/Denied:   PASRR Number: [id number]  Discharge Plan: SNF    Current Diagnoses: Patient Active Problem List   Diagnosis Date Noted  . Protein-calorie malnutrition, severe 10/27/2019  . Pressure injury of skin 10/24/2019  . Mallory-Weiss tear   . GI bleed 10/23/2019  . Alcoholic hepatitis 10/23/2019  . Sinus tachycardia 10/23/2019  . SIRS (systemic inflammatory response syndrome) (HCC) 03/23/2019  . Hyponatremia 03/23/2019  . Hypokalemia 03/23/2019  . Benign essential HTN 03/23/2019  . Alcohol abuse 03/23/2019  . Lactic acidosis 03/23/2019  . Pancreatic cyst 03/23/2019    Orientation RESPIRATION BLADDER Height & Weight     Self, Time, Situation, Place  Normal Continent, External catheter Weight: 160 lb 4.4 oz (72.7 kg) Height:  5\' 9"  (175.3 cm)  BEHAVIORAL SYMPTOMS/MOOD NEUROLOGICAL BOWEL NUTRITION STATUS      Continent (see discharge summary)  AMBULATORY STATUS COMMUNICATION OF NEEDS Skin   Extensive Assist Verbally Surgical wounds(MASD, pressure injury)                       Personal Care Assistance Level of Assistance  Bathing, Feeding, Dressing Bathing Assistance: Limited assistance Feeding assistance: Independent Dressing Assistance: Limited assistance     Functional Limitations Info  Sight, Hearing, Speech Sight Info: Adequate Hearing Info: Impaired Speech Info:  Adequate    SPECIAL CARE FACTORS FREQUENCY  PT (By licensed PT), OT (By licensed OT)     PT Frequency: 5X per week OT Frequency: 5X per week            Contractures Contractures Info: Not present    Additional Factors Info  Code Status, Allergies Code Status Info: Full Allergies Info: NKA           Current Medications (10/28/2019):  This is the current hospital active medication list Current Facility-Administered Medications  Medication Dose Route Frequency Provider Last Rate Last Admin  . acetaminophen (TYLENOL) tablet 650 mg  650 mg Oral Q6H PRN 12/26/2019, MD   650 mg at 10/26/19 1538   Or  . acetaminophen (TYLENOL) suppository 650 mg  650 mg Rectal Q6H PRN 12/24/19, MD      . alum & mag hydroxide-simeth (MAALOX/MYLANTA) 200-200-20 MG/5ML suspension 30 mL  30 mL Oral Q4H PRN 04-26-2001, DO       And  . lidocaine (XYLOCAINE) 2 % viscous mouth solution 15 mL  15 mL Oral Q4H PRN Edsel Petrin, DO      . cefTRIAXone (ROCEPHIN) 2 g in sodium chloride 0.9 % 100 mL IVPB  2 g Intravenous Q24H Edsel Petrin, MD 200 mL/hr at 10/28/19 0600 2 g at 10/28/19 0600  . Chlorhexidine Gluconate Cloth 2 % PADS 6 each  6 each Topical Daily 12/26/19, MD   6 each at 10/28/19 1027  . famotidine (PEPCID) tablet 20 mg  20 mg Oral BID 12/26/19, DO  20 mg at 10/28/19 1035  . feeding supplement (ENSURE ENLIVE) (ENSURE ENLIVE) liquid 237 mL  237 mL Oral TID BM Mikhail, Velta Addison, DO   237 mL at 08/67/61 9509  . folic acid (FOLVITE) tablet 1 mg  1 mg Oral Daily Irene Shipper, MD   1 mg at 10/28/19 1027  . Gerhardt's butt cream   Topical TID Irene Shipper, MD   Given at 10/28/19 1026  . LORazepam (ATIVAN) injection 0.5 mg  0.5 mg Intravenous Q6H PRN Cristal Ford, DO      . MEDLINE mouth rinse  15 mL Mouth Rinse BID Irene Shipper, MD   15 mL at 10/28/19 1027  . multivitamin with minerals tablet 1 tablet  1 tablet Oral Daily Irene Shipper, MD   1 tablet at 10/28/19 1026  .  ondansetron (ZOFRAN) tablet 4 mg  4 mg Oral Q6H PRN Irene Shipper, MD       Or  . ondansetron [facility]) injection 4 mg  4 mg Intravenous Q6H PRN Irene Shipper, MD   4 mg at 10/28/19 1025  . pantoprazole (PROTONIX) injection 40 mg  40 mg Intravenous Q12H Irene Shipper, MD   40 mg at 10/28/19 1025  . simethicone (MYLICON) 40 TO/6.7TI suspension 80 mg  80 mg Oral Q6H PRN Anders Simmonds, MD   80 mg at 10/26/19 0844  . sucralfate (CARAFATE) 1 GM/10ML suspension 1 g  1 g Oral TID WC & HS Dana Allan I, MD   1 g at 10/28/19 1212  . thiamine tablet 100 mg  100 mg Oral Daily Irene Shipper, MD   100 mg at 10/28/19 1027   Or  . thiamine (B-1) injection 100 mg  100 mg Intravenous Daily Irene Shipper, MD   100 mg at 10/23/19 2200  . traMADol (ULTRAM) tablet 50 mg  50 mg Oral Q6H PRN Cristal Ford, DO   50 mg at 10/28/19 1027  . zolpidem (AMBIEN) tablet 5 mg  5 mg Oral QHS PRN Cristal Ford, DO   5 mg at 10/26/19 2039     Discharge Medications: Please see discharge summary for a list of discharge medications.  Relevant Imaging Results:  Relevant Lab Results:   Additional Information SS# [id number]  [place], [place]

## 2019-10-29 ENCOUNTER — Inpatient Hospital Stay (HOSPITAL_COMMUNITY): Payer: Self-pay

## 2019-10-29 HISTORY — PX: IR PARACENTESIS: IMG2679

## 2019-10-29 LAB — CBC
HCT: 28.7 % — ABNORMAL LOW (ref 39.0–52.0)
Hemoglobin: 10 g/dL — ABNORMAL LOW (ref 13.0–17.0)
MCH: 34.5 pg — ABNORMAL HIGH (ref 26.0–34.0)
MCHC: 34.8 g/dL (ref 30.0–36.0)
MCV: 99 fL (ref 80.0–100.0)
Platelets: 95 10*3/uL — ABNORMAL LOW (ref 150–400)
RBC: 2.9 MIL/uL — ABNORMAL LOW (ref 4.22–5.81)
RDW: 21.1 % — ABNORMAL HIGH (ref 11.5–15.5)
WBC: 5.8 10*3/uL (ref 4.0–10.5)
nRBC: 0 % (ref 0.0–0.2)

## 2019-10-29 LAB — BASIC METABOLIC PANEL
Anion gap: 6 (ref 5–15)
BUN: 7 mg/dL (ref 6–20)
CO2: 29 mmol/L (ref 22–32)
Calcium: 7.7 mg/dL — ABNORMAL LOW (ref 8.9–10.3)
Chloride: 100 mmol/L (ref 98–111)
Creatinine, Ser: 0.58 mg/dL — ABNORMAL LOW (ref 0.61–1.24)
GFR calc Af Amer: >60 mL/min (ref >60)
GFR calc non Af Amer: >60 mL/min (ref >60)
Glucose, Bld: 88 mg/dL (ref 70–99)
Potassium: 3.8 mmol/L (ref 3.5–5.1)
Sodium: 135 mmol/L (ref 135–145)

## 2019-10-29 MED ORDER — LIDOCAINE HCL 1 % IJ SOLN
INTRAMUSCULAR | Status: AC
  Filled 2019-10-29: qty 20

## 2019-10-29 MED ORDER — LIDOCAINE HCL 1 % IJ SOLN
INTRAMUSCULAR | Status: DC | PRN
  Administered 2019-10-29: 10 mL

## 2019-10-29 NOTE — Procedures (Signed)
PROCEDURE SUMMARY:  Successful US guided paracentesis from left lateral abdomen.  Yielded 2.3 liters of yellow fluid.  No immediate complications.  Pt tolerated well.   Specimen was not sent for labs.  EBL < 70mL  Hoyt Koch PA-C 10/29/2019 1:55 PM

## 2019-10-29 NOTE — Progress Notes (Signed)
PROGRESS NOTE    Tom Bender  TSV:[id number] DOB: 1960-12-19 DOA: 10/22/2019 PCP: Patient, No Pcp Per   Brief Narrative:  HPI On 10/22/2018 by Dr. Lyda Perone Tom Bender is a 59 y.o. male with medical history significant of EtOH abuse that is ongoing, cirrhosis and varices, DTs.  Patient presents to the ED with c/o hematochezia.  He states that he has been having generalized abdominal pain since having gallbladder surgery last June. For the last 2-3 weeks, he has been having rectal bleeding. Stool is initially red and is now black. He has also had nausea and vomiting without any blood in the emesis. He feels generally weak and has been falling. He continues to consume alcohol and admits to drinking 40 ounces of beer today. He usually drinks 40-80 ounces of beer a day. He denies other drug use. He denies exposure to COVID-19.  On further review.  Looks like patient was admitted in Oct and again in Nov at [facility] with reports of GIB.  EGD in Oct just showed small non-bleeding varicies, no GAVE.  Colonoscopy in Aug with polypectomy done after c/o rectal bleeding then as well and bleeding thought to be due to internal hemorrhoids.  In Nov he was admitted for reported bleeding and initial S.Tachycardia.  Went into withdrawals the next day.  Was hemoccult positive then, though HGB remained stable so they didn't scope him.  Was found to have bacterial PNA during that admit.  Interim history Patient admitted with GI bleed.  He was given FFP, packed red blood cells, IV fluids, as he was noted to be hypotensive.  Patient was admitted to ICU.  Gastroenterology consulted, status post EGD. Continues to have abdominal pain, added GI cocktail with lidocaine. Assessment & Plan   Hypovolemic shock -Secondary to hemorrhagic cause, GI losses as well as poor hydration and alcohol use -Patient did have lactic acidosis but however this resolved with IV fluid resuscitation -Was given IV fluids as well  as packed red blood cells  GI bleed/hematemesis, melena/acute blood loss anemia/Symptomatic anemia -Hemoglobin dropped to 8.4, currently 10.2 -Transfused 1u FFP, 2uPRBC -EGD showed small esophageal varices without stigmata.  Small Mallory-Weiss tear without stigmata-which explains coffee-ground emesis.  Possible small proximal gastric varices.  Mild portal gastropathy.  Alcoholic hepatitis.  Chronic alcoholism.  Recommended to stop octreotide, continue PPI.  Advance diet as tolerated.  Alcohol cessation.  Patient to follow-up with his primary gastroenterologist in [facility] Dr. Marcelene Butte. -patient has been on ceftriaxone -Continues to complain of feeling weak  Abdominal pain  -Likely secondary to the above -Continue pain control -Continue sucralfate -Continues to feel pain, continue GI cocktail with lidocaine, pepcid -obtained Abd [place]: Cirrhotic/steatotic appearance of liver with small volume ascites that is increased from 1/5 study -will increase tramadol -will order paracentesis   Hepatic cirrhosis secondary to alcohol use disorder -On CIWA -needs alcohol counseling  Thrombocytopenia secondary to liver disease -platelets are stable, currently 95 -Continue to monitor CBC  Hypokalemia -Continue to replace and monitor  Hypomagnesemia -magnesium 1.9  Hypophosphatemia -Resolved with replacement, continue to monitor  Hyponatremia -Sodium currently 135 -Continue to monitor BMP  Deconditioning -likely secondary to the above -PT and OT recommended SNF -TOC consulted- may be a difficult placement given insurance  DVT Prophylaxis  SCDs  Code Status: Full  Family Communication: None at bedside  Disposition Plan: Admitted, pending improvement in abdominal pain. Dispo TBD-TOC consulted and pending  Consultants PCCM Gastroenterology  Procedures  EGD  Antibiotics   Anti-infectives (From  admission, onward)   Start     Dose/Rate Route Frequency Ordered Stop   10/23/19  0500  cefTRIAXone (ROCEPHIN) 2 g in sodium chloride 0.9 % 100 mL IVPB     2 g 200 mL/hr over 30 Minutes Intravenous Every 24 hours 10/23/19 0453        Subjective:   Tom Bender seen and examined today.  Continues to complain of abdominal pain.  States that it feels like a dog.  Feels that the burning pain has improved.  Denies nausea or vomiting.  Has had bowel movements.  Denies chest pain or shortness of breath, dizziness or headache.  Objective:   Vitals:   10/28/19 1127 10/28/19 2000 10/29/19 0035 10/29/19 0446  BP:  92/63 91/62 95/72   Pulse: 99 88 91 86  Resp: 16 13 15 18   Temp:  98.5 F (36.9 C) 98.1 F (36.7 C) 98 F (36.7 C)  TempSrc:  Oral Oral Oral  SpO2: 95% 96% 96% 93%  Weight:      Height:        Intake/Output Summary (Last 24 hours) at 10/29/2019 0954 Last data filed at 10/29/2019 0446 Gross per 24 hour  Intake 120 ml  Output 300 ml  Net -180 ml   Filed Weights   10/22/19 1837 10/24/19 0400 10/24/19 0941  Weight: 72.6 kg 72.7 kg 72.7 kg   Exam  General: Well developed, chronically ill-appearing, NAD  HEENT: NCAT, mucous membranes moist.   Cardiovascular: S1 S2 auscultated, RRR  Respiratory: Clear to auscultation bilaterally  Abdomen: Soft, generalized TTP, mildly distended, + bowel sounds  Extremities: warm dry without cyanosis clubbing or edema  Neuro: AAOx3, nonfocal  Psych: Appropriate mood and affect  Data Reviewed: I have personally reviewed following labs and imaging studies  CBC: Recent Labs  Lab 10/24/19 0030 10/24/19 0625 10/24/19 1234 10/24/19 1926 10/25/19 0253 10/26/19 0526 10/27/19 0123 10/28/19 0106 10/29/19 0708  WBC 7.6 6.1 5.1 4.9 4.9 4.4 4.9 5.2 5.8  NEUTROABS 4.9 4.0 3.3 3.0  --  2.3  --   --   --   HGB 9.7* 9.4* 9.6* 10.3* 9.9* 10.1* 9.8* 10.2* 10.0*  HCT 27.8* 27.5* 27.8* 29.3* 28.3* 29.5* 28.1* 29.1* 28.7*  MCV 94.6 96.8 97.2 96.4 95.0 98.0 96.9 98.0 99.0  PLT 48* 52* 48* 49* 48* 61* 70* 84* 95*   Basic  Metabolic Panel: Recent Labs  Lab 10/25/19 0739 10/25/19 1615 10/26/19 0526 10/27/19 0123 10/28/19 0106 10/29/19 0708  NA  --  129* 133* 134* 133* 135  K  --  3.2* 3.1* 3.2* 3.4* 3.8  CL  --  98 98 101 98 100  CO2  --  24 26 24 28 29   GLUCOSE  --  104* 87 99 107* 88  BUN  --  5* <5* <5* 5* 7  CREATININE  --  0.47* 0.46* 0.54* 0.50* 0.58*  CALCIUM  --  7.4* 7.5* 7.6* 7.9* 7.7*  MG 1.6* 1.6* 1.7 1.5* 1.9  --   PHOS 1.8* 1.6* 2.2* 3.2 2.9  --    GFR: Estimated Creatinine Clearance: 103.1 mL/min (A) (by C-G formula based on SCr of 0.58 mg/dL (L)). Liver Function Tests: Recent Labs  Lab 10/22/19 1915 10/23/19 0452 10/25/19 0253 10/26/19 0526 10/27/19 0123  AST 195* 141* 179*  --  158*  ALT 60* 43 59*  --  66*  ALKPHOS 191* 140* 123  --  151*  BILITOT 4.5* 3.4* 4.1*  --  3.8*  PROT 6.8 5.3*  5.0*  --  5.3*  ALBUMIN 2.5* 1.9* 1.9* 1.8* 1.8*   Recent Labs  Lab 10/25/19 0253  LIPASE 31  AMYLASE 39   Recent Labs  Lab 10/23/19 0036  AMMONIA 41*   Coagulation Profile: Recent Labs  Lab 10/23/19 0452 10/24/19 0625  INR 2.0* 1.7*   Cardiac Enzymes: No results for input(s): CKTOTAL, CKMB, CKMBINDEX, TROPONINI in the last 168 hours. BNP (last 3 results) No results for input(s): PROBNP in the last 8760 hours. HbA1C: No results for input(s): HGBA1C in the last 72 hours. CBG: Recent Labs  Lab 10/23/19 2001  GLUCAP 110*   Lipid Profile: No results for input(s): CHOL, HDL, LDLCALC, TRIG, CHOLHDL, LDLDIRECT in the last 72 hours. Thyroid Function Tests: No results for input(s): TSH, T4TOTAL, FREET4, T3FREE, THYROIDAB in the last 72 hours. Anemia Panel: No results for input(s): VITAMINB12, FOLATE, FERRITIN, TIBC, IRON, RETICCTPCT in the last 72 hours. Urine analysis:    Component Value Date/Time   COLORURINE YELLOW 03/23/2019 1606   APPEARANCEUR CLEAR 03/23/2019 1606   LABSPEC 1.010 03/23/2019 1606   PHURINE 8.5 (H) 03/23/2019 1606   GLUCOSEU NEGATIVE 03/23/2019  1606   HGBUR NEGATIVE 03/23/2019 1606   BILIRUBINUR small 04/18/2019 0956   KETONESUR NEGATIVE 03/23/2019 1606   PROTEINUR Positive (A) 04/18/2019 0956   PROTEINUR NEGATIVE 03/23/2019 1606   UROBILINOGEN 0.2 04/18/2019 0956   NITRITE neg 04/18/2019 0956   NITRITE NEGATIVE 03/23/2019 1606   LEUKOCYTESUR Negative 04/18/2019 0956   LEUKOCYTESUR NEGATIVE 03/23/2019 1606   Sepsis Labs: @LABRCNTIP (procalcitonin:4,lacticidven:4)  ) Recent Results (from the past 240 hour(s))  SARS CORONAVIRUS 2 (TAT 6-24 HRS) Nasopharyngeal Nasopharyngeal Swab     Status: None   Collection Time: 10/23/19  6:50 AM   Specimen: Nasopharyngeal Swab  Result Value Ref Range Status   SARS Coronavirus 2 NEGATIVE NEGATIVE Final    Comment: (NOTE) SARS-CoV-2 target nucleic acids are NOT DETECTED. The SARS-CoV-2 RNA is generally detectable in upper and lower respiratory specimens during the acute phase of infection. Negative results do not preclude SARS-CoV-2 infection, do not rule out co-infections with other pathogens, and should not be used as the sole basis for treatment or other patient management decisions. Negative results must be combined with clinical observations, patient history, and epidemiological information. The expected result is Negative. Fact Sheet for Patients: 12/21/19 Fact Sheet for Healthcare Providers: HairSlick.no This test is not yet approved or cleared by the quierodirigir.com FDA and  has been authorized for detection and/or diagnosis of SARS-CoV-2 by FDA under an Emergency Use Authorization (EUA). This EUA will remain  in effect (meaning this test can be used) for the duration of the COVID-19 declaration under Section 56 4(b)(1) of the Act, 21 U.S.C. section 360bbb-3(b)(1), unless the authorization is terminated or revoked sooner. Performed at [facility], [address], [place], [place]        Radiology Studies: 06301 Abdomen Complete  Result Date: 10/29/2019 CLINICAL DATA:  Abdominal pain EXAM: ABDOMEN ULTRASOUND COMPLETE COMPARISON:  10/23/2019 right upper quadrant ultrasound FINDINGS: Gallbladder: History of cholecystectomy. Common bile duct: Diameter: 7 mm. Where visualized, no filling defect. Liver: Heterogeneous, echogenic liver with surface lobulation suggesting cirrhosis. This would be a change from June 2020 MRI when surface lobulation was not seen, although the caudate lobe was large. There is antegrade flow in the main portal vein. The adjacent hepatic artery is likely hypertrophic. No evident mass lesion. IVC: Not clearly seen Pancreas: Not clearly seen Spleen: Size and appearance  within normal limits. Right Kidney: Length: 12.5 cm. Echogenicity within normal limits. No mass or hydronephrosis visualized. Left Kidney: Length: 12.3 cm. Echogenicity within normal limits. No mass or hydronephrosis visualized. Abdominal aorta: Largely obscured by bowel gas. Where seen proximally there is no aneurysm. Other findings: Small volume ascites with simple appearance, seen in multiple pockets in the abdomen. Deepest pocket over the over the liver is 15 mm, increased comparison. Left pleural effusion that is small where seen IMPRESSION: 1. Cirrhotic/steatotic appearance of the liver with small volume ascites that has increased from 10/23/2019 study. 2. Obscured pancreas, IVC, and aorta. Electronically Signed   By: Monte Fantasia M.D.   On: 10/29/2019 08:50     Scheduled Meds: . Chlorhexidine Gluconate Cloth  6 each Topical Daily  . famotidine  20 mg Oral BID  . feeding supplement (ENSURE ENLIVE)  237 mL Oral TID BM  . folic acid  1 mg Oral Daily  . Gerhardt's butt cream   Topical TID  . mouth rinse  15 mL Mouth Rinse BID  . multivitamin with minerals  1 tablet Oral Daily  . pantoprazole  40 mg Intravenous Q12H  . sucralfate  1 g Oral TID WC & HS  . thiamine  100 mg Oral Daily   Or  .  thiamine  100 mg Intravenous Daily   Continuous Infusions: . cefTRIAXone (ROCEPHIN)  IV 2 g (10/29/19 0449)     LOS: 6 days   Time Spent in minutes   45 minutes  Vada Swift D.O. on 10/29/2019 at 9:54 AM  Between 7am to 7pm - Please see pager noted on [email or web address]  After 7pm go to [email or web address]  And look for the night coverage person covering for me after hours  [facility] Office  [phone]

## 2019-10-29 NOTE — TOC Progression Note (Signed)
Transition of Care [facility]) - Progression Note    Patient Details  Name: Tom Bender MRN: [id number] Date of Birth: 1960/11/28  Transition of Care [facility]) CM/SW Contact  Eduard Roux, [place] Phone Number: 10/29/2019, 4:27 PM  Clinical Narrative:     Patient has ne bed offers.  [facility] - not accepting new admits.   Antony Blackbird, MSW, LCSWA Clinical Social Worker   Expected Discharge Plan: Skilled Nursing Facility Barriers to Discharge: Inadequate or no insurance, Continued Medical Work up, SNF Pending bed offer  Expected Discharge Plan and Services Expected Discharge Plan: Skilled Nursing Facility       Living arrangements for the past 2 months: Single Family Home                                       Social Determinants of Health (SDOH) Interventions    Readmission Risk Interventions No flowsheet data found.

## 2019-10-30 LAB — COMPREHENSIVE METABOLIC PANEL
ALT: 40 U/L (ref 0–44)
AST: 88 U/L — ABNORMAL HIGH (ref 15–41)
Albumin: 1.6 g/dL — ABNORMAL LOW (ref 3.5–5.0)
Alkaline Phosphatase: 140 U/L — ABNORMAL HIGH (ref 38–126)
Anion gap: 6 (ref 5–15)
BUN: 7 mg/dL (ref 6–20)
CO2: 27 mmol/L (ref 22–32)
Calcium: 7.7 mg/dL — ABNORMAL LOW (ref 8.9–10.3)
Chloride: 101 mmol/L (ref 98–111)
Creatinine, Ser: 0.51 mg/dL — ABNORMAL LOW (ref 0.61–1.24)
GFR calc Af Amer: >60 mL/min (ref >60)
GFR calc non Af Amer: >60 mL/min (ref >60)
Glucose, Bld: 92 mg/dL (ref 70–99)
Potassium: 3.7 mmol/L (ref 3.5–5.1)
Sodium: 134 mmol/L — ABNORMAL LOW (ref 135–145)
Total Bilirubin: 2.5 mg/dL — ABNORMAL HIGH (ref 0.3–1.2)
Total Protein: 4.9 g/dL — ABNORMAL LOW (ref 6.5–8.1)

## 2019-10-30 LAB — CBC
HCT: 28.3 % — ABNORMAL LOW (ref 39.0–52.0)
Hemoglobin: 9.4 g/dL — ABNORMAL LOW (ref 13.0–17.0)
MCH: 33.9 pg (ref 26.0–34.0)
MCHC: 33.2 g/dL (ref 30.0–36.0)
MCV: 102.2 fL — ABNORMAL HIGH (ref 80.0–100.0)
Platelets: 106 10*3/uL — ABNORMAL LOW (ref 150–400)
RBC: 2.77 MIL/uL — ABNORMAL LOW (ref 4.22–5.81)
RDW: 21.2 % — ABNORMAL HIGH (ref 11.5–15.5)
WBC: 5.4 10*3/uL (ref 4.0–10.5)
nRBC: 0 % (ref 0.0–0.2)

## 2019-10-30 LAB — PHOSPHORUS: Phosphorus: 2.9 mg/dL (ref 2.5–4.6)

## 2019-10-30 LAB — VITAMIN C: Vitamin C: 0.2 mg/dL — ABNORMAL LOW (ref 0.4–2.0)

## 2019-10-30 LAB — MAGNESIUM: Magnesium: 1.7 mg/dL (ref 1.7–2.4)

## 2019-10-30 LAB — ZINC: Zinc: 38 ug/dL — ABNORMAL LOW (ref 44–115)

## 2019-10-30 MED ORDER — FUROSEMIDE 20 MG PO TABS
20.0000 mg | ORAL_TABLET | Freq: Every day | ORAL | Status: DC
  Administered 2019-10-30 – 2019-11-09 (×12): 20 mg via ORAL
  Filled 2019-10-30 (×13): qty 1

## 2019-10-30 MED ORDER — SPIRONOLACTONE 25 MG PO TABS
25.0000 mg | ORAL_TABLET | Freq: Every day | ORAL | Status: DC
  Administered 2019-10-30 – 2019-11-02 (×4): 25 mg via ORAL
  Filled 2019-10-30 (×4): qty 1

## 2019-10-30 NOTE — Progress Notes (Signed)
PROGRESS NOTE    Tom Bender  YHC:[id number] DOB: 1961/05/08 DOA: 10/22/2019 PCP: Patient, No Pcp Per   Brief Narrative:  HPI On 10/22/2018 by Dr. Lyda Perone [name] is a 59 y.o. male with medical history significant of EtOH abuse that is ongoing, cirrhosis and varices, DTs.  Patient presents to the ED with c/o hematochezia.  He states that he has been having generalized abdominal pain since having gallbladder surgery last June. For the last 2-3 weeks, he has been having rectal bleeding. Stool is initially red and is now black. He has also had nausea and vomiting without any blood in the emesis. He feels generally weak and has been falling. He continues to consume alcohol and admits to drinking 40 ounces of beer today. He usually drinks 40-80 ounces of beer a day. He denies other drug use. He denies exposure to COVID-19.  On further review.  Looks like patient was admitted in Oct and again in Nov at [facility] with reports of GIB.  EGD in Oct just showed small non-bleeding varicies, no GAVE.  Colonoscopy in Aug with polypectomy done after c/o rectal bleeding then as well and bleeding thought to be due to internal hemorrhoids.  In Nov he was admitted for reported bleeding and initial S.Tachycardia.  Went into withdrawals the next day.  Was hemoccult positive then, though HGB remained stable so they didn't scope him.  Was found to have bacterial PNA during that admit.  Interim history Patient admitted with GI bleed.  He was given FFP, packed red blood cells, IV fluids, as he was noted to be hypotensive.  Patient was admitted to ICU.  Gastroenterology consulted, status post EGD. Continues to have abdominal pain, added GI cocktail with lidocaine.  Attained abdominal ultrasound showing small ascites, status post paracentesis yielding 2.3 L. Assessment & Plan   Hypovolemic shock -Secondary to hemorrhagic cause, GI losses as well as poor hydration and alcohol use -Patient did have  lactic acidosis but however this resolved with IV fluid resuscitation -Was given IV fluids as well as packed red blood cells  GI bleed/hematemesis, melena/acute blood loss anemia/Symptomatic anemia -Hemoglobin dropped to 8.4, currently 9.4 -Transfused 1u FFP, 2uPRBC -EGD showed small esophageal varices without stigmata.  Small Mallory-Weiss tear without stigmata-which explains coffee-ground emesis.  Possible small proximal gastric varices.  Mild portal gastropathy.  Alcoholic hepatitis.  Chronic alcoholism.  Recommended to stop octreotide, continue PPI.  Advance diet as tolerated.  Alcohol cessation.  Patient to follow-up with his primary gastroenterologist in [facility] Dr. Marcelene Butte. -patient has been on ceftriaxone -Continues to complain of feeling weak  Abdominal pain  -Likely secondary to the above -Continue pain control -Continue sucralfate -Continues to feel pain, continue GI cocktail with lidocaine, pepcid -obtained Abd [place]: Cirrhotic/steatotic appearance of liver with small volume ascites that is increased from 1/5 study -s/p paracentesis yielding 2.3 L -abdominal pain mildly elevated  Hepatic cirrhosis with ascites secondary to alcohol use disorder -On CIWA -needs alcohol counseling -suspect patient needs to be on lasix/spironolactone- and can follow up with his GI physician, Dr. Marcelene Butte  -Will start patient on lasix and spironolactone   Thrombocytopenia secondary to liver disease -platelets are stable, currently 106 -Continue to monitor CBC  Hypokalemia -Continue to replace and monitor  Hypomagnesemia -Resolved with replacement, continue to monitor   Hypophosphatemia -Resolved with replacement, continue to monitor  Hyponatremia -Sodium currently 134 -Continue to monitor BMP  Deconditioning -likely secondary to the above -PT and OT recommended SNF -TOC consulted- may be  a difficult placement given insurance  DVT Prophylaxis  SCDs  Code Status:  Full  Family Communication: None at bedside  Disposition Plan: Admitted, pending improvement in abdominal pain. Dispo TBD-TOC consulted and pending  Consultants PCCM Gastroenterology Interventional radiology  Procedures  EGD US paracentesis  Antibiotics   Anti-infectives (From admission, onward)   Start     Dose/Rate Route Frequency Ordered Stop   10/23/19 0500  cefTRIAXone (ROCEPHIN) 2 g in sodium chloride 0.9 % 100 mL IVPB     2 g 200 mL/hr over 30 Minutes Intravenous Every 24 hours 10/23/19 0453        Subjective:   [name] seen and examined today.  Continues to have abdominal pain but states it is getting better.  He feels that after the abdominal ultrasound and paracentesis yesterday then a lot of the pressure has been removed.  Denies current chest pain, shortness of breath, nausea or vomiting, dizziness or headache.  Objective:   Vitals:   10/30/19 0028 10/30/19 0428 10/30/19 0429 10/30/19 0817  BP: 99/73 (!) 86/68 97/78 96/77   Pulse: 78 85 84 84  Resp:    18  Temp: 98.1 F (36.7 C) 98.3 F (36.8 C)  98.3 F (36.8 C)  TempSrc: Oral Oral  Oral  SpO2: 95% 96% 97% 97%  Weight:      Height:        Intake/Output Summary (Last 24 hours) at 10/30/2019 1015 Last data filed at 10/30/2019 6387 Gross per 24 hour  Intake 750 ml  Output 450 ml  Net 300 ml   Filed Weights   10/22/19 1837 10/24/19 0400 10/24/19 0941  Weight: 72.6 kg 72.7 kg 72.7 kg   Exam  General: Well developed, chronically ill-appearing, NAD  HEENT: NCAT, mucous membranes moist.   Cardiovascular: S1 S2 auscultated, RRR  Respiratory: Clear to auscultation bilaterally  Abdomen: Soft, generalized TTP, mildly distended, + bowel sounds  Extremities: warm dry without cyanosis clubbing. ++LE edema  Neuro: AAOx3, nonfocal   Psych: Normal affect and demeanor   Data Reviewed: I have personally reviewed following labs and imaging studies  CBC: Recent Labs  Lab 10/24/19 0030  10/24/19 0625 10/24/19 1234 10/24/19 1926 10/26/19 0526 10/27/19 0123 10/28/19 0106 10/29/19 0708 10/30/19 0256  WBC 7.6 6.1 5.1 4.9 4.4 4.9 5.2 5.8 5.4  NEUTROABS 4.9 4.0 3.3 3.0 2.3  --   --   --   --   HGB 9.7* 9.4* 9.6* 10.3* 10.1* 9.8* 10.2* 10.0* 9.4*  HCT 27.8* 27.5* 27.8* 29.3* 29.5* 28.1* 29.1* 28.7* 28.3*  MCV 94.6 96.8 97.2 96.4 98.0 96.9 98.0 99.0 102.2*  PLT 48* 52* 48* 49* 61* 70* 84* 95* 564*   Basic Metabolic Panel: Recent Labs  Lab 10/25/19 1615 10/26/19 0526 10/27/19 0123 10/28/19 0106 10/29/19 0708 10/30/19 0256  NA 129* 133* 134* 133* 135 134*  K 3.2* 3.1* 3.2* 3.4* 3.8 3.7  CL 98 98 101 98 100 101  CO2 24 26 24 28 29 27   GLUCOSE 104* 87 99 107* 88 92  BUN 5* <5* <5* 5* 7 7  CREATININE 0.47* 0.46* 0.54* 0.50* 0.58* 0.51*  CALCIUM 7.4* 7.5* 7.6* 7.9* 7.7* 7.7*  MG 1.6* 1.7 1.5* 1.9  --  1.7  PHOS 1.6* 2.2* 3.2 2.9  --  2.9   GFR: Estimated Creatinine Clearance: 103.1 mL/min (A) (by C-G formula based on SCr of 0.51 mg/dL (L)). Liver Function Tests: Recent Labs  Lab 10/25/19 0253 10/26/19 3329 10/27/19 0123 10/30/19 0256  AST 179*  --  158* 88*  ALT 59*  --  66* 40  ALKPHOS 123  --  151* 140*  BILITOT 4.1*  --  3.8* 2.5*  PROT 5.0*  --  5.3* 4.9*  ALBUMIN 1.9* 1.8* 1.8* 1.6*   Recent Labs  Lab 10/25/19 0253  LIPASE 31  AMYLASE 39   No results for input(s): AMMONIA in the last 168 hours. Coagulation Profile: Recent Labs  Lab 10/24/19 0625  INR 1.7*   Cardiac Enzymes: No results for input(s): CKTOTAL, CKMB, CKMBINDEX, TROPONINI in the last 168 hours. BNP (last 3 results) No results for input(s): PROBNP in the last 8760 hours. HbA1C: No results for input(s): HGBA1C in the last 72 hours. CBG: Recent Labs  Lab 10/23/19 2001  GLUCAP 110*   Lipid Profile: No results for input(s): CHOL, HDL, LDLCALC, TRIG, CHOLHDL, LDLDIRECT in the last 72 hours. Thyroid Function Tests: No results for input(s): TSH, T4TOTAL, FREET4, T3FREE,  THYROIDAB in the last 72 hours. Anemia Panel: No results for input(s): VITAMINB12, FOLATE, FERRITIN, TIBC, IRON, RETICCTPCT in the last 72 hours. Urine analysis:    Component Value Date/Time   COLORURINE YELLOW 03/23/2019 1606   APPEARANCEUR CLEAR 03/23/2019 1606   LABSPEC 1.010 03/23/2019 1606   PHURINE 8.5 (H) 03/23/2019 1606   GLUCOSEU NEGATIVE 03/23/2019 1606   HGBUR NEGATIVE 03/23/2019 1606   BILIRUBINUR small 04/18/2019 0956   KETONESUR NEGATIVE 03/23/2019 1606   PROTEINUR Positive (A) 04/18/2019 0956   PROTEINUR NEGATIVE 03/23/2019 1606   UROBILINOGEN 0.2 04/18/2019 0956   NITRITE neg 04/18/2019 0956   NITRITE NEGATIVE 03/23/2019 1606   LEUKOCYTESUR Negative 04/18/2019 0956   LEUKOCYTESUR NEGATIVE 03/23/2019 1606   Sepsis Labs: @LABRCNTIP (procalcitonin:4,lacticidven:4)  ) Recent Results (from the past 240 hour(s))  SARS CORONAVIRUS 2 (TAT 6-24 HRS) Nasopharyngeal Nasopharyngeal Swab     Status: None   Collection Time: 10/23/19  6:50 AM   Specimen: Nasopharyngeal Swab  Result Value Ref Range Status   SARS Coronavirus 2 NEGATIVE NEGATIVE Final    Comment: (NOTE) SARS-CoV-2 target nucleic acids are NOT DETECTED. The SARS-CoV-2 RNA is generally detectable in upper and lower respiratory specimens during the acute phase of infection. Negative results do not preclude SARS-CoV-2 infection, do not rule out co-infections with other pathogens, and should not be used as the sole basis for treatment or other patient management decisions. Negative results must be combined with clinical observations, patient history, and epidemiological information. The expected result is Negative. Fact Sheet for Patients: 12/21/19 Fact Sheet for Healthcare Providers: HairSlick.no This test is not yet approved or cleared by the quierodirigir.com FDA and  has been authorized for detection and/or diagnosis of SARS-CoV-2 by FDA under an  Emergency Use Authorization (EUA). This EUA will remain  in effect (meaning this test can be used) for the duration of the COVID-19 declaration under Section 56 4(b)(1) of the Act, 21 U.S.C. section 360bbb-3(b)(1), unless the authorization is terminated or revoked sooner. Performed at [facility], [address] [place], [place]       Radiology Studies: 03474 Abdomen Complete  Result Date: 10/29/2019 CLINICAL DATA:  Abdominal pain EXAM: ABDOMEN ULTRASOUND COMPLETE COMPARISON:  10/23/2019 right upper quadrant ultrasound FINDINGS: Gallbladder: History of cholecystectomy. Common bile duct: Diameter: 7 mm. Where visualized, no filling defect. Liver: Heterogeneous, echogenic liver with surface lobulation suggesting cirrhosis. This would be a change from June 2020 MRI when surface lobulation was not seen, although the caudate lobe was large. There is antegrade  flow in the main portal vein. The adjacent hepatic artery is likely hypertrophic. No evident mass lesion. IVC: Not clearly seen Pancreas: Not clearly seen Spleen: Size and appearance within normal limits. Right Kidney: Length: 12.5 cm. Echogenicity within normal limits. No mass or hydronephrosis visualized. Left Kidney: Length: 12.3 cm. Echogenicity within normal limits. No mass or hydronephrosis visualized. Abdominal aorta: Largely obscured by bowel gas. Where seen proximally there is no aneurysm. Other findings: Small volume ascites with simple appearance, seen in multiple pockets in the abdomen. Deepest pocket over the over the liver is 15 mm, increased comparison. Left pleural effusion that is small where seen IMPRESSION: 1. Cirrhotic/steatotic appearance of the liver with small volume ascites that has increased from 10/23/2019 study. 2. Obscured pancreas, IVC, and aorta. Electronically Signed   By: Marnee Spring M.D.   On: 10/29/2019 08:50   IR Paracentesis  Result Date: 10/29/2019 INDICATION: Patient with cirrhosis secondary  to alcohol use disorder, new onset ascites. Request is made for therapeutic paracentesis. EXAM: ULTRASOUND GUIDED THERAPEUTIC PARACENTESIS MEDICATIONS: 10 mL 1% lidocaine COMPLICATIONS: None immediate. PROCEDURE: Informed written consent was obtained from the patient after a discussion of the risks, benefits and alternatives to treatment. A timeout was performed prior to the initiation of the procedure. Initial ultrasound scanning demonstrates a small amount of ascites within the right lower abdominal quadrant. The right lower abdomen was prepped and draped in the usual sterile fashion. 1% lidocaine was used for local anesthesia. Following this, a 19 gauge, 7-cm, Yueh catheter was introduced. An ultrasound image was saved for documentation purposes. The paracentesis was performed. The catheter was removed and a dressing was applied. The patient tolerated the procedure well without immediate post procedural complication. FINDINGS: A total of approximately 2.3 liters of yellow fluid was removed. Samples were sent to the laboratory as requested by the clinical team. IMPRESSION: Successful ultrasound-guided therapeutic paracentesis yielding 2.3 liters of peritoneal fluid. Read by: Loyce Dys PA-C Electronically Signed   By: Corlis Leak M.D.   On: 10/29/2019 13:57     Scheduled Meds: . Chlorhexidine Gluconate Cloth  6 each Topical Daily  . famotidine  20 mg Oral BID  . feeding supplement (ENSURE ENLIVE)  237 mL Oral TID BM  . folic acid  1 mg Oral Daily  . Gerhardt's butt cream   Topical TID  . mouth rinse  15 mL Mouth Rinse BID  . multivitamin with minerals  1 tablet Oral Daily  . pantoprazole  40 mg Intravenous Q12H  . sucralfate  1 g Oral TID WC & HS  . thiamine  100 mg Oral Daily   Or  . thiamine  100 mg Intravenous Daily   Continuous Infusions: . cefTRIAXone (ROCEPHIN)  IV 2 g (10/30/19 0437)     LOS: 7 days   Time Spent in minutes   45 minutes  Clementina Mareno D.O. on 10/30/2019 at 10:15  AM  Between 7am to 7pm - Please see pager noted on [email or web address]  After 7pm go to [email or web address]  And look for the night coverage person covering for me after hours  [facility] Office  [phone]

## 2019-10-30 NOTE — Progress Notes (Signed)
Physical Therapy Treatment Patient Details Name: Tom Bender MRN: [id number] DOB: [birth year] Today's Date: 10/30/2019    History of Present Illness Pt is 59 year old male with PMH significant for alcohol abuse that is continuous, liver cirrhosis with varices and DVTs.  Patient was admitted with hematochezia and dark blood tinged emesis.  Pt s/p EGD revealed small esophageal and query small proximal gastric varices with mild lower Weiss tear.  Hgb is 9.9.  Potassium is 3.2.    PT Comments    Patient is making gradual progress toward PT goals and tolerated gait distance of 25 ft this session. Pt requires min A +2 for safety with gait. Continue to recommend SNF for further skilled PT services.   Follow Up Recommendations  SNF     Equipment Recommendations  Other (comment)(TBD next venue)    Recommendations for Other Services       Precautions / Restrictions Precautions Precautions: Fall Precaution Comments: monitor BP Restrictions Weight Bearing Restrictions: No    Mobility  Bed Mobility Overal bed mobility: Modified Independent Bed Mobility: Supine to Sit;Sit to Supine           General bed mobility comments: HOB elevated  Transfers Overall transfer level: Needs assistance Equipment used: None;Rolling walker (2 wheeled) Transfers: Sit to/from Stand Sit to Stand: Min guard         General transfer comment: pt stood from EOB without AD with cues for hand placement and min guard for safety; use of RW upon standing to steady  Ambulation/Gait Ambulation/Gait assistance: Min assist;+2 safety/equipment Gait Distance (Feet): 25 Feet Assistive device: Rolling walker (2 wheeled) Gait Pattern/deviations: Step-through pattern;Decreased step length - right;Decreased step length - left Gait velocity: decreased   General Gait Details: decreased cadence and stride length; cues for safe use of AD; assistance to steady   Stairs             Wheelchair Mobility     Modified Rankin (Stroke Patients Only)       Balance Overall balance assessment: Needs assistance Sitting-balance support: Feet supported Sitting balance-Leahy Scale: Fair     Standing balance support: During functional activity;Bilateral upper extremity supported Standing balance-Leahy Scale: Poor                              Cognition Arousal/Alertness: Awake/alert Behavior During Therapy: WFL for tasks assessed/performed Overall Cognitive Status: Within Functional Limits for tasks assessed                                        Exercises      General Comments General comments (skin integrity, edema, etc.): pt with c/o dizziness with postural changes; BP in sitting 97/82, in standing 84/75, and in sitting post ambulation 102/88      Pertinent Vitals/Pain Pain Assessment: Faces Faces Pain Scale: Hurts little more Pain Location: stomach, back Pain Descriptors / Indicators: Discomfort Pain Intervention(s): Monitored during session;Repositioned    Home Living                      Prior Function            PT Goals (current goals can now be found in the care plan section) Acute Rehab PT Goals Patient Stated Goal: feel better Progress towards PT goals: Progressing toward goals    Frequency    Min  2X/week      PT Plan Current plan remains appropriate    Co-evaluation              AM-PAC PT "6 Clicks" Mobility   Outcome Measure  Help needed turning from your back to your side while in a flat bed without using bedrails?: A Lot Help needed moving from lying on your back to sitting on the side of a flat bed without using bedrails?: A Lot Help needed moving to and from a bed to a chair (including a wheelchair)?: Total Help needed standing up from a chair using your arms (e.g., wheelchair or bedside chair)?: Total Help needed to walk in hospital room?: Total Help needed climbing 3-5 steps with a railing? : Total 6  Click Score: 8    End of Session Equipment Utilized During Treatment: Gait belt Activity Tolerance: Patient limited by fatigue Patient left: with call bell/phone within reach;with bed alarm set;in bed;Other (comment)(bed in chair position) Nurse Communication: Mobility status PT Visit Diagnosis: Unsteadiness on feet (R26.81);Muscle weakness (generalized) (M62.81)     Time: 9980-6999 PT Time Calculation (min) (ACUTE ONLY): 26 min  Charges:  $Gait Training: 23-37 mins                     Erline Levine, PTA Acute Rehabilitation Services Pager: [phone] Office: [phone]     Carolynne Edouard 10/30/2019, 1:34 PM

## 2019-10-31 LAB — CBC
HCT: 30.5 % — ABNORMAL LOW (ref 39.0–52.0)
Hemoglobin: 10 g/dL — ABNORMAL LOW (ref 13.0–17.0)
MCH: 33.8 pg (ref 26.0–34.0)
MCHC: 32.8 g/dL (ref 30.0–36.0)
MCV: 103 fL — ABNORMAL HIGH (ref 80.0–100.0)
Platelets: 123 10*3/uL — ABNORMAL LOW (ref 150–400)
RBC: 2.96 MIL/uL — ABNORMAL LOW (ref 4.22–5.81)
RDW: 21.2 % — ABNORMAL HIGH (ref 11.5–15.5)
WBC: 5.4 10*3/uL (ref 4.0–10.5)
nRBC: 0 % (ref 0.0–0.2)

## 2019-10-31 LAB — COMPREHENSIVE METABOLIC PANEL
ALT: 40 U/L (ref 0–44)
AST: 85 U/L — ABNORMAL HIGH (ref 15–41)
Albumin: 1.7 g/dL — ABNORMAL LOW (ref 3.5–5.0)
Alkaline Phosphatase: 144 U/L — ABNORMAL HIGH (ref 38–126)
Anion gap: 4 — ABNORMAL LOW (ref 5–15)
BUN: 6 mg/dL (ref 6–20)
CO2: 29 mmol/L (ref 22–32)
Calcium: 8 mg/dL — ABNORMAL LOW (ref 8.9–10.3)
Chloride: 102 mmol/L (ref 98–111)
Creatinine, Ser: 0.56 mg/dL — ABNORMAL LOW (ref 0.61–1.24)
GFR calc Af Amer: >60 mL/min (ref >60)
GFR calc non Af Amer: >60 mL/min (ref >60)
Glucose, Bld: 96 mg/dL (ref 70–99)
Potassium: 3.9 mmol/L (ref 3.5–5.1)
Sodium: 135 mmol/L (ref 135–145)
Total Bilirubin: 2.7 mg/dL — ABNORMAL HIGH (ref 0.3–1.2)
Total Protein: 5.3 g/dL — ABNORMAL LOW (ref 6.5–8.1)

## 2019-10-31 LAB — MAGNESIUM: Magnesium: 1.7 mg/dL (ref 1.7–2.4)

## 2019-10-31 MED ORDER — ZINC SULFATE 220 (50 ZN) MG PO CAPS
220.0000 mg | ORAL_CAPSULE | Freq: Every day | ORAL | Status: DC
  Administered 2019-10-31 – 2019-11-15 (×16): 220 mg via ORAL
  Filled 2019-10-31 (×16): qty 1

## 2019-10-31 MED ORDER — ASCORBIC ACID 500 MG PO TABS
500.0000 mg | ORAL_TABLET | Freq: Two times a day (BID) | ORAL | Status: DC
  Administered 2019-10-31 – 2019-11-15 (×31): 500 mg via ORAL
  Filled 2019-10-31 (×31): qty 1

## 2019-10-31 MED ORDER — PANTOPRAZOLE SODIUM 40 MG PO TBEC
40.0000 mg | DELAYED_RELEASE_TABLET | Freq: Two times a day (BID) | ORAL | Status: DC
  Administered 2019-10-31 – 2019-11-15 (×30): 40 mg via ORAL
  Filled 2019-10-31 (×31): qty 1

## 2019-10-31 NOTE — Plan of Care (Signed)
Poc progressing.  

## 2019-10-31 NOTE — Progress Notes (Signed)
Nutrition Follow up  DOCUMENTATION CODES:   Severe malnutrition in context of chronic illness  INTERVENTION:    Continue Ensure Enlive po TID, each supplement provides 350 kcal and 20 grams of protein  Add Magic cup BID with meals, each supplement provides 290 kcal and 9 grams of protein  Replete Vitamin C (500 mg BID) and Zinc (220 mg daily)  Obtain daily weights   NUTRITION DIAGNOSIS:   Severe Malnutrition related to chronic illness(cirrhosis) as evidenced by severe fat depletion, severe muscle depletion.  Ongoing  GOAL:   Patient will meet greater than or equal to 90% of their needs   Progressing   MONITOR:   PO intake, Supplement acceptance, Labs  REASON FOR ASSESSMENT:   Malnutrition Screening Tool    ASSESSMENT:   Pt with PMH of ongoing ETOH abuse (40-80 oz beer daily), liver cirrhosis with varices and DVTs who lives with sister per review pt has had 2-3 weeks of weakness, falls, and rectal bleeding.   1/7 EGD reveals small esophageal and small proximal gastric varices with tear  Pt reports appetite is progressing. Was unable to eat lunch as he worked with PT at that time. Meal completions charted as 75-100% for his last eight meals (84% average). Drinking one Ensure daily. Encouraged pt to drink two daily especially if he he unable to finish his entire meal. Pt willing to try. Zinc and Vitamin C are both low. Spoke with MD, okay to order replacement.   Admission weight: 72.7 kg- no other weight obtained   I/O: +11,404 ml since admit  UOP:400 ml x 24 hrs   Medications: folic acid, 20 mg lasix daily, MVI with minerals, aldactone, thiamine  Labs: Vitamin C 0.2 (L) Zinc 38 (L)   Diet Order:   Diet Order            DIET SOFT Room service appropriate? Yes with Assist; Fluid consistency: Thin  Diet effective now              EDUCATION NEEDS:   Education needs have been addressed  Skin:  Skin Assessment: Skin Integrity Issues: Skin Integrity  Issues:: Stage I, Other (Comment)(MASD: perineum, buttocks) Stage I: L Ischial tuberosity Other: Open wound R/L buttocks  Last BM:  1/13  Height:   Ht Readings from Last 1 Encounters:  10/24/19 5\' 9"  (1.753 m)    Weight:   Wt Readings from Last 1 Encounters:  10/24/19 72.7 kg    Ideal Body Weight:  72.7 kg  BMI:  Body mass index is 23.67 kg/m.  Estimated Nutritional Needs:   Kcal:  2200-2400  Protein:  100-125 grams  Fluid:  2 L/day  12/22/19 RD, LDN Clinical Nutrition Pager # - [phone]

## 2019-10-31 NOTE — Progress Notes (Signed)
Occupational Therapy Treatment Patient Details Name: Tom Bender MRN: [id number] DOB: 1961/10/01 Today's Date: 10/31/2019    History of present illness Pt is 59 year old male with PMH significant for alcohol abuse that is continuous, liver cirrhosis with varices and DVTs.  Patient was admitted with hematochezia and dark blood tinged emesis.  Pt s/p EGD revealed small esophageal and query small proximal gastric varices with mild lower Weiss tear.  Hgb is 9.9.  Potassium is 3.2.   OT comments  Pt making steady progress towards OT goals this session. Pt continues to be limited by decreased balance and c/o dizziness with positional changes ( see vitals below). Pt completed simulated toilet transfer from EOB>recliner with MIN A +2 with RW for safety. Pt reports blurry vision with standing and reports he sometimes feels like he "sees things" likely d/t alcohol withdrawal. Pt able to complete LB ADL from EOB with supervision. Pt would benefit from SNF at time of DC as pt agrees he "cannot go home like this. " Will continue to follow acutely per POC.   Supine: 105/79 HR- 91 EOB: 103/84 HR- 93 Standing: 93/65  Sitting in recliner with legs up after transfer: 109/82 HR- 101     Follow Up Recommendations  SNF;Supervision/Assistance - 24 hour    Equipment Recommendations  Other (comment)(defer to next venue of care)    Recommendations for Other Services      Precautions / Restrictions Precautions Precautions: Fall Precaution Comments: monitor BP Restrictions Weight Bearing Restrictions: No       Mobility Bed Mobility Overal bed mobility: Modified Independent Bed Mobility: Supine to Sit           General bed mobility comments: HOB elevated  Transfers Overall transfer level: Needs assistance Equipment used: None;Rolling walker (2 wheeled) Transfers: Sit to/from [facility] Sit to Stand: Min guard Stand pivot transfers: Min assist;+2 safety/equipment        General transfer comment: MIN guard for safety d/t reports of dizziness    Balance Overall balance assessment: Needs assistance Sitting-balance support: Feet supported Sitting balance-Leahy Scale: Fair     Standing balance support: During functional activity;Bilateral upper extremity supported Standing balance-Leahy Scale: Poor Standing balance comment: reliant on BUE support for balance with mobility                           ADL either performed or assessed with clinical judgement   ADL Overall ADL's : Needs assistance/impaired                     Lower Body Dressing: Supervision/safety;Sitting/lateral leans Lower Body Dressing Details (indicate cue type and reason): able to pull up socks from EOB with supervision with no LOB Toilet Transfer: Min guard;Minimal assistance;Stand-pivot;RW Toilet Transfer Details (indicate cue type and reason): simulated to recline, min guard- min a for safety d/t dizziness         Functional mobility during ADLs: Minimal assistance;Min guard;Rolling walker General ADL Comments: pt requiring increased assistance with ADLs due to decreased strength, activity tolerance, safety awareness. session focus on functional transfer training but reports dizziness and blurry vision with mobility and change in position     Vision Patient Visual Report: Blurring of vision Vision Assessment?: Vision impaired- to be further tested in functional context Additional Comments: reports blurry vision when standing and reports "seeing things" likely secondary to alcohol withdrawal   Perception     Praxis      Cognition Arousal/Alertness:  Awake/alert Behavior During Therapy: WFL for tasks assessed/performed Overall Cognitive Status: No family/caregiver present to determine baseline cognitive functioning                                 General Comments: WFL limits for simple tasks        Exercises     Shoulder Instructions        General Comments pt with c/o dizziness with postural changes; BP in supine, 105/79,  sitting 103/84, in standing 93/65, and in sitting post stand pivot transfer 109/82    Pertinent Vitals/ Pain       Pain Assessment: Faces Faces Pain Scale: Hurts a little bit Pain Location: stomach, back Pain Descriptors / Indicators: Discomfort Pain Intervention(s): Limited activity within patient's tolerance;Monitored during session;Repositioned  Home Living                                          Prior Functioning/Environment              Frequency  Min 2X/week        Progress Toward Goals  OT Goals(current goals can now be found in the care plan section)  Progress towards OT goals: Progressing toward goals  Acute Rehab OT Goals Patient Stated Goal: feel better OT Goal Formulation: With patient Time For Goal Achievement: 11/09/19 Potential to Achieve Goals: Good  Plan Discharge plan remains appropriate    Co-evaluation                 AM-PAC OT "6 Clicks" Daily Activity     Outcome Measure   Help from another person eating meals?: A Little Help from another person taking care of personal grooming?: A Little Help from another person toileting, which includes using toliet, bedpan, or urinal?: A Lot Help from another person bathing (including washing, rinsing, drying)?: A Lot Help from another person to put on and taking off regular upper body clothing?: A Little Help from another person to put on and taking off regular lower body clothing?: A Lot 6 Click Score: 15    End of Session Equipment Utilized During Treatment: Gait belt;Rolling walker  OT Visit Diagnosis: Unsteadiness on feet (R26.81);Other abnormalities of gait and mobility (R26.89);History of falling (Z91.81);Muscle weakness (generalized) (M62.81)   Activity Tolerance Patient tolerated treatment well   Patient Left in chair;with call bell/phone within reach;with chair alarm set    Nurse Communication          Time: 2951-8841 OT Time Calculation (min): 15 min  Charges: OT General Charges $OT Visit: 1 Visit OT Treatments $Self Care/Home Management : 8-22 mins  Tom Amel., COTA/L Acute Rehabilitation Services [phone]    Tom Bender 10/31/2019, 2:27 PM

## 2019-10-31 NOTE — Progress Notes (Signed)
PROGRESS NOTE    Tom Bender  JJH:[id number] DOB: 1961-05-16 DOA: 10/22/2019 PCP: Patient, No Pcp Per     Brief Narrative:  Tom Bender is a 59 y.o.malewith medical history significant ofEtOH abuse that is ongoing, cirrhosis and varices, DTs.  Patient presents to the ED with c/o hematochezia.He states that he has been having generalized abdominal pain since having gallbladder surgery last June. For the last 2-3 weeks, he has been having rectal bleeding. Stool is initially red and is now black. He has also had nausea and vomiting without any blood in the emesis. He feels generally weak and has been falling. He continues to consume alcohol and admits to drinking 40 ounces of beer today. He usually drinks 40-80 ounces of beer a day. He denies other drug use. He denies exposure to COVID-19.  On further review, patient was admitted in Oct and again in Nov at [facility] with reports of GIB. EGD in Oct just showed small non-bleeding varicies, no GAVE. Colonoscopy in Aug with polypectomy done after c/o rectal bleeding then as well and bleeding thought to be due to internal hemorrhoids.  In Nov he was admitted for reported bleeding and initial S.Tachycardia. Went into withdrawals the next day. Was hemoccult positive then, though HGB remained stable so they didn't scope him. Was found to have bacterial PNA during that admit.  Patient admitted to [facility] on 10/22/2019 with GI bleed.  He was given FFP, packed red blood cells, IV fluids, as he was noted to be hypotensive.  Patient was admitted to ICU.  Gastroenterology consulted, status post EGD. Continues to have abdominal pain, added GI cocktail with lidocaine.  Attained abdominal ultrasound showing small ascites, status post paracentesis yielding 2.3 L.  New events last 24 hours / Subjective: Continues to have some intermittent nausea and abdominal pain, without vomiting  Assessment & Plan:    Principal Problem:   GI bleed Active Problems:   Hyponatremia   Benign essential HTN   Alcohol abuse   Alcoholic hepatitis   Sinus tachycardia   Pressure injury of skin   Mallory-Weiss tear   Protein-calorie malnutrition, severe   Hypovolemic shock -Secondary to hemorrhagic cause, GI losses as well as poor hydration and alcohol use -Resolved, blood pressure on the lower side but stable with SBP in the 90s  GI bleed, hematemesis, melena, acute blood loss anemia, symptomatic anemia -Transfused 1u FFP, 2uPRBC 10/23/2019 -EGD 10/24/2019 showed small esophageal varices without stigmata.  Small Mallory-Weiss tear without stigmata, which explains coffee-ground emesis.  Possible small proximal gastric varices.  Mild portal gastropathy.  Alcoholic hepatitis.  Chronic alcoholism.  Recommended to stop octreotide, continue PPI.  Patient to follow-up with his primary gastroenterologist in [facility] Dr. Marcelene [place]te -Hemoglobin remained stable at 10.0  Abdominal pain  -Likely secondary to the above -Continue pain control -Continue sucralfate -Continue GI cocktail with lidocaine, pepcid -Abd [place]: Cirrhotic/steatotic appearance of liver with small volume ascites that is increased from 1/5 study. S/p paracentesis yielding 2.3 L  Hepatic cirrhosis with ascites secondary to alcohol use disorder -Continue lasix and spironolactone   Thrombocytopenia secondary to liver disease -Stable  Deconditioning -PT and OT recommended SNF   In agreement with assessment of the pressure ulcer as below:  Pressure Injury 10/23/19 Ischial tuberosity Left Stage 1 -  Intact skin with non-blanchable redness of a localized area usually over a bony prominence. Non blanchable redness on the left trochanter region present on admission (Active)  10/23/19 1900  Location: Ischial  tuberosity  Location Orientation: Left  Staging: Stage 1 -  Intact skin with non-blanchable redness of a localized area usually over a bony  prominence.  Wound Description (Comments): Non blanchable redness on the left trochanter region present on admission  Present on Admission: Yes     DVT prophylaxis: SCD Code Status: Full  Family Communication: No family at bedside Disposition Plan: Pending improvement in abdominal pain. SNF placement pending.    Consultants:   PCCM  GI   Antimicrobials:  Anti-infectives (From admission, onward)   Start     Dose/Rate Route Frequency Ordered Stop   10/23/19 0500  cefTRIAXone (ROCEPHIN) 2 g in sodium chloride 0.9 % 100 mL IVPB     2 g 200 mL/hr over 30 Minutes Intravenous Every 24 hours 10/23/19 0453          Objective: Vitals:   10/30/19 2129 10/31/19 0010 10/31/19 0530 10/31/19 0900  BP: 94/77 91/68 91/70  96/74  Pulse: 80 90 72 84  Resp: 14 20  16   Temp: 97.7 F (36.5 C) 98.3 F (36.8 C) 98.3 F (36.8 C) 98.2 F (36.8 C)  TempSrc: Oral Oral Oral Oral  SpO2: 100% 97% 100% 100%  Weight:      Height:        Intake/Output Summary (Last 24 hours) at 10/31/2019 1115 Last data filed at 10/31/2019 0900 Gross per 24 hour  Intake 680 ml  Output 300 ml  Net 380 ml   Filed Weights   10/22/19 1837 10/24/19 0400 10/24/19 0941  Weight: 72.6 kg 72.7 kg 72.7 kg    Examination:  General exam: Appears calm and comfortable  Respiratory system: Clear to auscultation. Respiratory effort normal. No respiratory distress. No conversational dyspnea.  Cardiovascular system: S1 & S2 heard, RRR. No murmurs. No pedal edema. Gastrointestinal system: Abdomen is mildly distended, soft and nontender. Normal bowel sounds heard. Central nervous system: Alert and oriented. No focal neurological deficits. Speech clear.  Extremities: Symmetric in appearance  Skin: No rashes, lesions or ulcers on exposed skin  Psychiatry: Judgement and insight appear normal. Mood & affect appropriate.   Data Reviewed: I have personally reviewed following labs and imaging studies  CBC: Recent Labs  Lab  10/24/19 1234 10/24/19 1926 10/26/19 0526 10/27/19 0123 10/28/19 0106 10/29/19 0708 10/30/19 0256 10/31/19 0312  WBC 5.1 4.9 4.4 4.9 5.2 5.8 5.4 5.4  NEUTROABS 3.3 3.0 2.3  --   --   --   --   --   HGB 9.6* 10.3* 10.1* 9.8* 10.2* 10.0* 9.4* 10.0*  HCT 27.8* 29.3* 29.5* 28.1* 29.1* 28.7* 28.3* 30.5*  MCV 97.2 96.4 98.0 96.9 98.0 99.0 102.2* 103.0*  PLT 48* 49* 61* 70* 84* 95* 106* 123*   Basic Metabolic Panel: Recent Labs  Lab 10/25/19 1615 10/26/19 0526 10/27/19 0123 10/28/19 0106 10/29/19 0708 10/30/19 0256 10/31/19 0312  NA 129* 133* 134* 133* 135 134* 135  K 3.2* 3.1* 3.2* 3.4* 3.8 3.7 3.9  CL 98 98 101 98 100 101 102  CO2 24 26 24 28 29 27 29   GLUCOSE 104* 87 99 107* 88 92 96  BUN 5* <5* <5* 5* 7 7 6   CREATININE 0.47* 0.46* 0.54* 0.50* 0.58* 0.51* 0.56*  CALCIUM 7.4* 7.5* 7.6* 7.9* 7.7* 7.7* 8.0*  MG 1.6* 1.7 1.5* 1.9  --  1.7 1.7  PHOS 1.6* 2.2* 3.2 2.9  --  2.9  --    GFR: Estimated Creatinine Clearance: 103.1 mL/min (A) (by C-G formula based on SCr  of 0.56 mg/dL (L)). Liver Function Tests: Recent Labs  Lab 10/25/19 0253 10/26/19 0526 10/27/19 0123 10/30/19 0256 10/31/19 0312  AST 179*  --  158* 88* 85*  ALT 59*  --  66* 40 40  ALKPHOS 123  --  151* 140* 144*  BILITOT 4.1*  --  3.8* 2.5* 2.7*  PROT 5.0*  --  5.3* 4.9* 5.3*  ALBUMIN 1.9* 1.8* 1.8* 1.6* 1.7*   Recent Labs  Lab 10/25/19 0253  LIPASE 31  AMYLASE 39   No results for input(s): AMMONIA in the last 168 hours. Coagulation Profile: No results for input(s): INR, PROTIME in the last 168 hours. Cardiac Enzymes: No results for input(s): CKTOTAL, CKMB, CKMBINDEX, TROPONINI in the last 168 hours. BNP (last 3 results) No results for input(s): PROBNP in the last 8760 hours. HbA1C: No results for input(s): HGBA1C in the last 72 hours. CBG: No results for input(s): GLUCAP in the last 168 hours. Lipid Profile: No results for input(s): CHOL, HDL, LDLCALC, TRIG, CHOLHDL, LDLDIRECT in the last 72  hours. Thyroid Function Tests: No results for input(s): TSH, T4TOTAL, FREET4, T3FREE, THYROIDAB in the last 72 hours. Anemia Panel: No results for input(s): VITAMINB12, FOLATE, FERRITIN, TIBC, IRON, RETICCTPCT in the last 72 hours. Sepsis Labs: Recent Labs  Lab 10/25/19 0739  PROCALCITON 0.16    Recent Results (from the past 240 hour(s))  SARS CORONAVIRUS 2 (TAT 6-24 HRS) Nasopharyngeal Nasopharyngeal Swab     Status: None   Collection Time: 10/23/19  6:50 AM   Specimen: Nasopharyngeal Swab  Result Value Ref Range Status   SARS Coronavirus 2 NEGATIVE NEGATIVE Final    Comment: (NOTE) SARS-CoV-2 target nucleic acids are NOT DETECTED. The SARS-CoV-2 RNA is generally detectable in upper and lower respiratory specimens during the acute phase of infection. Negative results do not preclude SARS-CoV-2 infection, do not rule out co-infections with other pathogens, and should not be used as the sole basis for treatment or other patient management decisions. Negative results must be combined with clinical observations, patient history, and epidemiological information. The expected result is Negative. Fact Sheet for Patients: HairSlick.no Fact Sheet for Healthcare Providers: quierodirigir.com This test is not yet approved or cleared by the [place] FDA and  has been authorized for detection and/or diagnosis of SARS-CoV-2 by FDA under an Emergency Use Authorization (EUA). This EUA will remain  in effect (meaning this test can be used) for the duration of the COVID-19 declaration under Section 56 4(b)(1) of the Act, 21 U.S.C. section 360bbb-3(b)(1), unless the authorization is terminated or revoked sooner. Performed at [facility], [address] [place], [place] [address]       Radiology Studies: IR Paracentesis  Result Date: 10/29/2019 INDICATION: Patient with cirrhosis secondary to alcohol use disorder, new onset  ascites. Request is made for therapeutic paracentesis. EXAM: ULTRASOUND GUIDED THERAPEUTIC PARACENTESIS MEDICATIONS: 10 mL 1% lidocaine COMPLICATIONS: None immediate. PROCEDURE: Informed written consent was obtained from the patient after a discussion of the risks, benefits and alternatives to treatment. A timeout was performed prior to the initiation of the procedure. Initial ultrasound scanning demonstrates a small amount of ascites within the right lower abdominal quadrant. The right lower abdomen was prepped and draped in the usual sterile fashion. 1% lidocaine was used for local anesthesia. Following this, a 19 gauge, 7-cm, Yueh catheter was introduced. An ultrasound image was saved for documentation purposes. The paracentesis was performed. The catheter was removed and a dressing was applied. The patient tolerated the  procedure well without immediate post procedural complication. FINDINGS: A total of approximately 2.3 liters of yellow fluid was removed. Samples were sent to the laboratory as requested by the clinical team. IMPRESSION: Successful ultrasound-guided therapeutic paracentesis yielding 2.3 liters of peritoneal fluid. Read by: Brynda Greathouse PA-C Electronically Signed   By: Lucrezia Europe M.D.   On: 10/29/2019 13:57      Scheduled Meds: . Chlorhexidine Gluconate Cloth  6 each Topical Daily  . famotidine  20 mg Oral BID  . feeding supplement (ENSURE ENLIVE)  237 mL Oral TID BM  . folic acid  1 mg Oral Daily  . furosemide  20 mg Oral Daily  . Gerhardt's butt cream   Topical TID  . mouth rinse  15 mL Mouth Rinse BID  . multivitamin with minerals  1 tablet Oral Daily  . pantoprazole  40 mg Intravenous Q12H  . spironolactone  25 mg Oral Daily  . sucralfate  1 g Oral TID WC & HS  . thiamine  100 mg Oral Daily   Or  . thiamine  100 mg Intravenous Daily   Continuous Infusions: . cefTRIAXone (ROCEPHIN)  IV 2 g (10/31/19 0528)     LOS: 8 days      Time spent: 35 minutes   Dessa Phi, DO [facility] 10/31/2019, 11:15 AM   Available via Epic secure chat 7am-7pm After these hours, please refer to coverage provider listed on [email or web address]

## 2019-10-31 NOTE — Progress Notes (Addendum)
Physical Therapy Treatment Patient Details Name: Jospeh Mangel MRN: [id number] DOB: 1961-08-26 Today's Date: 10/31/2019    History of Present Illness Pt is 59 year old male with PMH significant for alcohol abuse that is continuous, liver cirrhosis with varices and DVTs.  Patient was admitted with hematochezia and dark blood tinged emesis.  Pt s/p EGD revealed small esophageal and query small proximal gastric varices with mild lower Weiss tear.  Hgb is 9.9.  Potassium is 3.2.    PT Comments     Patient is making progress toward PT goals and tolerated increased gait distance of 50 ft X 2 trials.  Pt continues to c/o dizziness with mobility. BP in sitting post ambulation 104/84 (92). Continue to progress as tolerated.     Follow Up Recommendations  SNF     Equipment Recommendations  Other (comment)(TBD next venue)    Recommendations for Other Services       Precautions / Restrictions Precautions Precautions: Fall Precaution Comments: monitor BP Restrictions Weight Bearing Restrictions: No    Mobility  Bed Mobility Overal bed mobility: Modified Independent Bed Mobility: Supine to Sit;Sit to Supine     Supine to sit: Supervision;HOB elevated Sit to supine: Supervision   General bed mobility comments: HOB elevated  Transfers Overall transfer level: Needs assistance Equipment used: None;Rolling walker (2 wheeled) Transfers: Sit to/from Stand Sit to Stand: Min guard Stand pivot transfers: Min assist;+2 safety/equipment       General transfer comment: cues for hand placement from EOB and recliner  Ambulation/Gait Ambulation/Gait assistance: Min assist;Min guard(chair follow) Gait Distance (Feet): (50 ft X 2 trials with seated rest break ) Assistive device: Rolling walker (2 wheeled) Gait Pattern/deviations: Step-through pattern;Decreased step length - left;Decreased step length - right;Shuffle Gait velocity: decreased   General Gait Details: assist for balance; cues  for increased bilat step lengths and upright posture; seated break due to fatigue and c/o dizziness; pt with tremors at times    Stairs             Wheelchair Mobility    Modified Rankin (Stroke Patients Only)       Balance Overall balance assessment: Needs assistance Sitting-balance support: Feet supported Sitting balance-Leahy Scale: Fair     Standing balance support: During functional activity;Bilateral upper extremity supported Standing balance-Leahy Scale: Poor Standing balance comment: reliant on BUE support for balance with mobility                            Cognition Arousal/Alertness: Awake/alert Behavior During Therapy: WFL for tasks assessed/performed Overall Cognitive Status: No family/caregiver present to determine baseline cognitive functioning Area of Impairment: Problem solving                             Problem Solving: Slow processing;Difficulty sequencing General Comments: WFL limits for simple tasks      Exercises      General Comments General comments (skin integrity, edema, etc.): BP in sitting post ambulation 104/84 (92)      Pertinent Vitals/Pain Pain Assessment: No/denies pain Faces Pain Scale: Hurts a little bit Pain Location: stomach, back Pain Descriptors / Indicators: Discomfort Pain Intervention(s): Limited activity within patient's tolerance;Monitored during session;Repositioned    Home Living                      Prior Function            PT  Goals (current goals can now be found in the care plan section) Acute Rehab PT Goals Patient Stated Goal: feel better Progress towards PT goals: Progressing toward goals    Frequency    Min 2X/week      PT Plan Current plan remains appropriate    Co-evaluation              AM-PAC PT "6 Clicks" Mobility   Outcome Measure  Help needed turning from your back to your side while in a flat bed without using bedrails?: A Little Help  needed moving from lying on your back to sitting on the side of a flat bed without using bedrails?: A Little Help needed moving to and from a bed to a chair (including a wheelchair)?: A Little Help needed standing up from a chair using your arms (e.g., wheelchair or bedside chair)?: A Little Help needed to walk in hospital room?: A Little Help needed climbing 3-5 steps with a railing? : A Lot 6 Click Score: 17    End of Session Equipment Utilized During Treatment: Gait belt Activity Tolerance: Patient tolerated treatment well Patient left: with call bell/phone within reach;in bed;Other (comment)(bed alrm not set due to be not working correctly) Nurse Communication: Mobility status;Other (comment)(secreatary notified of bed not working ) PT Visit Diagnosis: Unsteadiness on feet (R26.81);Muscle weakness (generalized) (M62.81)     Time: 6283-1517 PT Time Calculation (min) (ACUTE ONLY): 30 min  Charges:  $Gait Training: 23-37 mins                     Erline Levine, PTA Acute Rehabilitation Services Pager: [phone] Office: [phone]     Carolynne Edouard 10/31/2019, 4:48 PM

## 2019-11-01 LAB — CBC
HCT: 29.6 % — ABNORMAL LOW (ref 39.0–52.0)
Hemoglobin: 9.8 g/dL — ABNORMAL LOW (ref 13.0–17.0)
MCH: 34.1 pg — ABNORMAL HIGH (ref 26.0–34.0)
MCHC: 33.1 g/dL (ref 30.0–36.0)
MCV: 103.1 fL — ABNORMAL HIGH (ref 80.0–100.0)
Platelets: 136 10*3/uL — ABNORMAL LOW (ref 150–400)
RBC: 2.87 MIL/uL — ABNORMAL LOW (ref 4.22–5.81)
RDW: 21.1 % — ABNORMAL HIGH (ref 11.5–15.5)
WBC: 6.2 10*3/uL (ref 4.0–10.5)
nRBC: 0 % (ref 0.0–0.2)

## 2019-11-01 LAB — BASIC METABOLIC PANEL
Anion gap: 6 (ref 5–15)
BUN: 7 mg/dL (ref 6–20)
CO2: 27 mmol/L (ref 22–32)
Calcium: 7.8 mg/dL — ABNORMAL LOW (ref 8.9–10.3)
Chloride: 101 mmol/L (ref 98–111)
Creatinine, Ser: 0.49 mg/dL — ABNORMAL LOW (ref 0.61–1.24)
GFR calc Af Amer: >60 mL/min (ref >60)
GFR calc non Af Amer: >60 mL/min (ref >60)
Glucose, Bld: 89 mg/dL (ref 70–99)
Potassium: 3.9 mmol/L (ref 3.5–5.1)
Sodium: 134 mmol/L — ABNORMAL LOW (ref 135–145)

## 2019-11-01 MED ORDER — DOCUSATE SODIUM 100 MG PO CAPS
100.0000 mg | ORAL_CAPSULE | Freq: Two times a day (BID) | ORAL | Status: DC | PRN
  Administered 2019-11-02 (×2): 100 mg via ORAL
  Filled 2019-11-01 (×2): qty 1

## 2019-11-01 NOTE — Progress Notes (Signed)
PROGRESS NOTE    Tom Bender  RJJ:[id number] DOB: Apr 14, 1961 DOA: 10/22/2019 PCP: Patient, No Pcp Per     Brief Narrative:  Tom Bender is a 59 y.o.malewith medical history significant ofEtOH abuse that is ongoing, cirrhosis and varices, DTs.  Patient presents to the ED with c/o hematochezia.He states that he has been having generalized abdominal pain since having gallbladder surgery last June. For the last 2-3 weeks, he has been having rectal bleeding. Stool is initially red and is now black. He has also had nausea and vomiting without any blood in the emesis. He feels generally weak and has been falling. He continues to consume alcohol and admits to drinking 40 ounces of beer today. He usually drinks 40-80 ounces of beer a day. He denies other drug use. He denies exposure to COVID-19.  On further review, patient was admitted in Oct and again in Nov at [facility] with reports of GIB. EGD in Oct just showed small non-bleeding varicies, no GAVE. Colonoscopy in Aug with polypectomy done after c/o rectal bleeding then as well and bleeding thought to be due to internal hemorrhoids.  In Nov he was admitted for reported bleeding and initial S.Tachycardia. Went into withdrawals the next day. Was hemoccult positive then, though HGB remained stable so they didn't scope him. Was found to have bacterial PNA during that admit.  Patient admitted to [facility] on 10/22/2019 with GI bleed.  He was given FFP, packed red blood cells, IV fluids, as he was noted to be hypotensive.  Patient was admitted to ICU.  Gastroenterology consulted, status post EGD. Continues to have abdominal pain, added GI cocktail with lidocaine.  Attained abdominal ultrasound showing small ascites, status post paracentesis yielding 2.3 L.  New events last 24 hours / Subjective: Continues to have some intermittent nausea and abdominal pain which is generalized.  States that it is relieved by  Zofran, pain medication as well as Protonix.  He tells me that he was just informed last night that his sister has enrolled in hospice due to liver failure.  Assessment & Plan:   Principal Problem:   GI bleed Active Problems:   Hyponatremia   Benign essential HTN   Alcohol abuse   Alcoholic hepatitis   Sinus tachycardia   Pressure injury of skin   Mallory-Weiss tear   Protein-calorie malnutrition, severe   Hypovolemic shock -Secondary to hemorrhagic cause, GI losses as well as poor hydration and alcohol use -Resolved, blood pressure on the lower side but stable with SBP in the 90-100s  GI bleed, hematemesis, melena, acute blood loss anemia, symptomatic anemia -Transfused 1u FFP, 2uPRBC 10/23/2019 -EGD 10/24/2019 showed small esophageal varices without stigmata.  Small Mallory-Weiss tear without stigmata, which explains coffee-ground emesis.  Possible small proximal gastric varices.  Mild portal gastropathy.  Alcoholic hepatitis.  Chronic alcoholism.  Recommended to stop octreotide, continue PPI.  Patient to follow-up with his primary gastroenterologist in [facility] Dr. Marcelene Butte -Hemoglobin remained stable at 9.8  Abdominal pain  -Likely secondary to the above -Continue pain control -Continue sucralfate -Continue GI cocktail with lidocaine, pepcid -Abd [place]: Cirrhotic/steatotic appearance of liver with small volume ascites that is increased from 1/5 study. S/p paracentesis 1/11 yielding 2.3 L  Hepatic cirrhosis with ascites secondary to alcohol use disorder -Continue lasix and spironolactone   Thrombocytopenia secondary to liver disease -Platelet count remains improved  Deconditioning -PT and OT recommended SNF  Zinc and vitamin C deficiency  -Appreciate dietitian, replace p.o.   In  agreement with assessment of the pressure ulcer as below:  Pressure Injury 10/23/19 Ischial tuberosity Left Stage 1 -  Intact skin with non-blanchable redness of a localized area usually  over a bony prominence. Non blanchable redness on the left trochanter region present on admission (Active)  10/23/19 1900  Location: Ischial tuberosity  Location Orientation: Left  Staging: Stage 1 -  Intact skin with non-blanchable redness of a localized area usually over a bony prominence.  Wound Description (Comments): Non blanchable redness on the left trochanter region present on admission  Present on Admission: Yes     DVT prophylaxis: SCD Code Status: Full  Family Communication: No family at bedside Disposition Plan: Pending improvement in abdominal pain. SNF placement pending.    Consultants:   PCCM  GI   Antimicrobials:  Anti-infectives (From admission, onward)   Start     Dose/Rate Route Frequency Ordered Stop   10/23/19 0500  cefTRIAXone (ROCEPHIN) 2 g in sodium chloride 0.9 % 100 mL IVPB  Status:  Discontinued     2 g 200 mL/hr over 30 Minutes Intravenous Every 24 hours 10/23/19 0453 10/31/19 1121       Objective: Vitals:   10/31/19 0900 10/31/19 2009 11/01/19 0610 11/01/19 0901  BP: 96/74 102/78 98/69 111/70  Pulse: 84 85 92 84  Resp: 16 18 14    Temp: 98.2 F (36.8 C) 98.6 F (37 C) 97.9 F (36.6 C)   TempSrc: Oral Oral Oral   SpO2: 100% 100% 100%   Weight:      Height:        Intake/Output Summary (Last 24 hours) at 11/01/2019 1030 Last data filed at 11/01/2019 0600 Gross per 24 hour  Intake 1217 ml  Output 1100 ml  Net 117 ml   Filed Weights   10/22/19 1837 10/24/19 0400 10/24/19 0941  Weight: 72.6 kg 72.7 kg 72.7 kg    Examination: General exam: Appears calm and comfortable  Respiratory system: Clear to auscultation. Respiratory effort normal. Cardiovascular system: S1 & S2 heard, RRR. No pedal edema. Gastrointestinal system: Abdomen is mildly distended, soft, positive bowel sounds Central nervous system: Alert and oriented. Non focal exam. Speech clear  Extremities: Symmetric in appearance bilaterally  Skin: No rashes, lesions or  ulcers on exposed skin  Psychiatry: Judgement and insight appear stable. Mood & affect appropriate.    Data Reviewed: I have personally reviewed following labs and imaging studies  CBC: Recent Labs  Lab 10/26/19 0526 10/27/19 0123 10/28/19 0106 10/29/19 0708 10/30/19 0256 10/31/19 0312 11/01/19 0228  WBC 4.4   < > 5.2 5.8 5.4 5.4 6.2  NEUTROABS 2.3  --   --   --   --   --   --   HGB 10.1*   < > 10.2* 10.0* 9.4* 10.0* 9.8*  HCT 29.5*   < > 29.1* 28.7* 28.3* 30.5* 29.6*  MCV 98.0   < > 98.0 99.0 102.2* 103.0* 103.1*  PLT 61*   < > 84* 95* 106* 123* 136*   < > = values in this interval not displayed.   Basic Metabolic Panel: Recent Labs  Lab 10/25/19 1615 10/25/19 1615 10/26/19 0526 10/26/19 0526 10/27/19 0123 10/27/19 0123 10/28/19 0106 10/29/19 0708 10/30/19 0256 10/31/19 0312 11/01/19 0228  NA 129*   < > 133*   < > 134*   < > 133* 135 134* 135 134*  K 3.2*   < > 3.1*   < > 3.2*   < > 3.4* 3.8 3.7 3.9  3.9  CL 98   < > 98   < > 101   < > 98 100 101 102 101  CO2 24   < > 26   < > 24   < > 28 29 27 29 27   GLUCOSE 104*   < > 87   < > 99   < > 107* 88 92 96 89  BUN 5*   < > <5*   < > <5*   < > 5* 7 7 6 7   CREATININE 0.47*   < > 0.46*   < > 0.54*   < > 0.50* 0.58* 0.51* 0.56* 0.49*  CALCIUM 7.4*   < > 7.5*   < > 7.6*   < > 7.9* 7.7* 7.7* 8.0* 7.8*  MG 1.6*   < > 1.7  --  1.5*  --  1.9  --  1.7 1.7  --   PHOS 1.6*  --  2.2*  --  3.2  --  2.9  --  2.9  --   --    < > = values in this interval not displayed.   GFR: Estimated Creatinine Clearance: 103.1 mL/min (A) (by C-G formula based on SCr of 0.49 mg/dL (L)). Liver Function Tests: Recent Labs  Lab 10/26/19 0526 10/27/19 0123 10/30/19 0256 10/31/19 0312  AST  --  158* 88* 85*  ALT  --  66* 40 40  ALKPHOS  --  151* 140* 144*  BILITOT  --  3.8* 2.5* 2.7*  PROT  --  5.3* 4.9* 5.3*  ALBUMIN 1.8* 1.8* 1.6* 1.7*   No results for input(s): LIPASE, AMYLASE in the last 168 hours. No results for input(s): AMMONIA in  the last 168 hours. Coagulation Profile: No results for input(s): INR, PROTIME in the last 168 hours. Cardiac Enzymes: No results for input(s): CKTOTAL, CKMB, CKMBINDEX, TROPONINI in the last 168 hours. BNP (last 3 results) No results for input(s): PROBNP in the last 8760 hours. HbA1C: No results for input(s): HGBA1C in the last 72 hours. CBG: No results for input(s): GLUCAP in the last 168 hours. Lipid Profile: No results for input(s): CHOL, HDL, LDLCALC, TRIG, CHOLHDL, LDLDIRECT in the last 72 hours. Thyroid Function Tests: No results for input(s): TSH, T4TOTAL, FREET4, T3FREE, THYROIDAB in the last 72 hours. Anemia Panel: No results for input(s): VITAMINB12, FOLATE, FERRITIN, TIBC, IRON, RETICCTPCT in the last 72 hours. Sepsis Labs: No results for input(s): PROCALCITON, LATICACIDVEN in the last 168 hours.  Recent Results (from the past 240 hour(s))  SARS CORONAVIRUS 2 (TAT 6-24 HRS) Nasopharyngeal Nasopharyngeal Swab     Status: None   Collection Time: 10/23/19  6:50 AM   Specimen: Nasopharyngeal Swab  Result Value Ref Range Status   SARS Coronavirus 2 NEGATIVE NEGATIVE Final    Comment: (NOTE) SARS-CoV-2 target nucleic acids are NOT DETECTED. The SARS-CoV-2 RNA is generally detectable in upper and lower respiratory specimens during the acute phase of infection. Negative results do not preclude SARS-CoV-2 infection, do not rule out co-infections with other pathogens, and should not be used as the sole basis for treatment or other patient management decisions. Negative results must be combined with clinical observations, patient history, and epidemiological information. The expected result is Negative. Fact Sheet for Patients: 11/02/19 Fact Sheet for Healthcare Providers: 12/21/19 This test is not yet approved or cleared by the [email or web address] FDA and  has been authorized for detection and/or diagnosis of  SARS-CoV-2 by FDA under an Emergency Use Authorization (EUA). This  EUA will remain  in effect (meaning this test can be used) for the duration of the COVID-19 declaration under Section 56 4(b)(1) of the Act, 21 U.S.C. section 360bbb-3(b)(1), unless the authorization is terminated or revoked sooner. Performed at [facility], [address], [place], [place] [address]       Radiology Studies: No results found.    Scheduled Meds: . vitamin C  500 mg Oral BID  . Chlorhexidine Gluconate Cloth  6 each Topical Daily  . famotidine  20 mg Oral BID  . feeding supplement (ENSURE ENLIVE)  237 mL Oral TID BM  . folic acid  1 mg Oral Daily  . furosemide  20 mg Oral Daily  . Gerhardt's butt cream   Topical TID  . mouth rinse  15 mL Mouth Rinse BID  . multivitamin with minerals  1 tablet Oral Daily  . pantoprazole  40 mg Oral BID AC  . spironolactone  25 mg Oral Daily  . sucralfate  1 g Oral TID WC & HS  . thiamine  100 mg Oral Daily   Or  . thiamine  100 mg Intravenous Daily  . zinc sulfate  220 mg Oral Daily   Continuous Infusions:    LOS: 9 days      Time spent: 25 minutes   Noralee Stain, DO [facility] 11/01/2019, 10:30 AM   Available via Epic secure chat 7am-7pm After these hours, please refer to coverage provider listed on [email or web address]

## 2019-11-02 ENCOUNTER — Inpatient Hospital Stay (HOSPITAL_COMMUNITY): Payer: Self-pay

## 2019-11-02 LAB — COMPREHENSIVE METABOLIC PANEL
ALT: 32 U/L (ref 0–44)
AST: 69 U/L — ABNORMAL HIGH (ref 15–41)
Albumin: 1.7 g/dL — ABNORMAL LOW (ref 3.5–5.0)
Alkaline Phosphatase: 136 U/L — ABNORMAL HIGH (ref 38–126)
Anion gap: 8 (ref 5–15)
BUN: 5 mg/dL — ABNORMAL LOW (ref 6–20)
CO2: 29 mmol/L (ref 22–32)
Calcium: 7.9 mg/dL — ABNORMAL LOW (ref 8.9–10.3)
Chloride: 99 mmol/L (ref 98–111)
Creatinine, Ser: 0.55 mg/dL — ABNORMAL LOW (ref 0.61–1.24)
GFR calc Af Amer: >60 mL/min (ref >60)
GFR calc non Af Amer: >60 mL/min (ref >60)
Glucose, Bld: 92 mg/dL (ref 70–99)
Potassium: 3.3 mmol/L — ABNORMAL LOW (ref 3.5–5.1)
Sodium: 136 mmol/L (ref 135–145)
Total Bilirubin: 2.3 mg/dL — ABNORMAL HIGH (ref 0.3–1.2)
Total Protein: 5.4 g/dL — ABNORMAL LOW (ref 6.5–8.1)

## 2019-11-02 LAB — CBC
HCT: 29.3 % — ABNORMAL LOW (ref 39.0–52.0)
Hemoglobin: 9.6 g/dL — ABNORMAL LOW (ref 13.0–17.0)
MCH: 33.7 pg (ref 26.0–34.0)
MCHC: 32.8 g/dL (ref 30.0–36.0)
MCV: 102.8 fL — ABNORMAL HIGH (ref 80.0–100.0)
Platelets: 133 10*3/uL — ABNORMAL LOW (ref 150–400)
RBC: 2.85 MIL/uL — ABNORMAL LOW (ref 4.22–5.81)
RDW: 20.8 % — ABNORMAL HIGH (ref 11.5–15.5)
WBC: 6.7 10*3/uL (ref 4.0–10.5)
nRBC: 0 % (ref 0.0–0.2)

## 2019-11-02 MED ORDER — SPIRONOLACTONE 25 MG PO TABS
50.0000 mg | ORAL_TABLET | Freq: Every day | ORAL | Status: DC
  Administered 2019-11-03 – 2019-11-09 (×7): 50 mg via ORAL
  Filled 2019-11-02 (×8): qty 2

## 2019-11-02 MED ORDER — POTASSIUM CHLORIDE CRYS ER 20 MEQ PO TBCR
40.0000 meq | EXTENDED_RELEASE_TABLET | Freq: Once | ORAL | Status: AC
  Administered 2019-11-02: 09:00:00 40 meq via ORAL
  Filled 2019-11-02: qty 2

## 2019-11-02 NOTE — Progress Notes (Signed)
Physical Therapy Treatment Patient Details Name: Tom Bender MRN: [id number] DOB: 03/02/1961 Today's Date: 11/02/2019    History of Present Illness Pt is 59 year old male with PMH significant for alcohol abuse that is continuous, liver cirrhosis with varices and DVTs.  Patient was admitted with hematochezia and dark blood tinged emesis.  Pt s/p EGD revealed small esophageal and query small proximal gastric varices with mild lower Weiss tear.  Hgb is 9.9.  Potassium is 3.2.    PT Comments    Patient continues to make progress toward PT goals. Pt with c/o abdominal and back pain (reports chronic back pain) and with SOB while ambulating. SpO2 88% on RA and rebounded quickly with seated rest break. Continue to progress as tolerated.     Follow Up Recommendations  SNF     Equipment Recommendations  Other (comment)(TBD next venue)    Recommendations for Other Services       Precautions / Restrictions Precautions Precautions: Fall Precaution Comments: monitor BP Restrictions Weight Bearing Restrictions: No    Mobility  Bed Mobility Overal bed mobility: Independent                Transfers Overall transfer level: Needs assistance Equipment used: None;Rolling walker (2 wheeled) Transfers: Sit to/from Stand Sit to Stand: Min guard         General transfer comment: min guard for safety   Ambulation/Gait Ambulation/Gait assistance: Min assist;Min guard Gait Distance (Feet): (70 ft X 2 trials with seated rest break) Assistive device: Rolling walker (2 wheeled);1 person hand held assist Gait Pattern/deviations: Step-through pattern;Decreased stride length;Trunk flexed Gait velocity: decreased   General Gait Details: cues for upright posture and breathing technique; min guard with RW and min A with HHA for short distance in room   Stairs             Wheelchair Mobility    Modified Rankin (Stroke Patients Only)       Balance Overall balance assessment:  Needs assistance Sitting-balance support: Feet supported Sitting balance-Leahy Scale: Fair     Standing balance support: During functional activity;Bilateral upper extremity supported;Single extremity supported Standing balance-Leahy Scale: Poor                              Cognition Arousal/Alertness: Awake/alert Behavior During Therapy: WFL for tasks assessed/performed Overall Cognitive Status: Within Functional Limits for tasks assessed(for mobility tasks)                                        Exercises      General Comments        Pertinent Vitals/Pain Pain Assessment: Faces Faces Pain Scale: Hurts little more Pain Location: stomach, back Pain Descriptors / Indicators: Discomfort;Guarding;Sore Pain Intervention(s): Limited activity within patient's tolerance;Monitored during session;Repositioned    Home Living                      Prior Function            PT Goals (current goals can now be found in the care plan section) Acute Rehab PT Goals Patient Stated Goal: feel better Progress towards PT goals: Progressing toward goals    Frequency    Min 2X/week      PT Plan Current plan remains appropriate    Co-evaluation  AM-PAC PT "6 Clicks" Mobility   Outcome Measure  Help needed turning from your back to your side while in a flat bed without using bedrails?: A Little Help needed moving from lying on your back to sitting on the side of a flat bed without using bedrails?: A Little Help needed moving to and from a bed to a chair (including a wheelchair)?: A Little Help needed standing up from a chair using your arms (e.g., wheelchair or bedside chair)?: A Little Help needed to walk in hospital room?: A Little Help needed climbing 3-5 steps with a railing? : A Lot 6 Click Score: 17    End of Session Equipment Utilized During Treatment: Gait belt Activity Tolerance: Patient tolerated treatment  well Patient left: with call bell/phone within reach;in bed;with bed alarm set Nurse Communication: Mobility status PT Visit Diagnosis: Unsteadiness on feet (R26.81);Muscle weakness (generalized) (M62.81)     Time: 2060-1561 PT Time Calculation (min) (ACUTE ONLY): 43 min  Charges:  $Gait Training: 23-37 mins $Therapeutic Activity: 8-22 mins                     Erline Levine, PTA Acute Rehabilitation Services Pager: [phone] Office: [phone]     Carolynne Edouard 11/02/2019, 1:23 PM

## 2019-11-02 NOTE — Progress Notes (Signed)
PROGRESS NOTE    Tom Bender  NGI:[id number] DOB: [birth year] DOA: 10/22/2019 PCP: Patient, No Pcp Per     Brief Narrative:  Tom Bender is a 59 y.o.malewith medical history significant ofEtOH abuse that is ongoing, cirrhosis and varices, DTs.  Patient presents to the ED with c/o hematochezia.He states that he has been having generalized abdominal pain since having gallbladder surgery last June. For the last 2-3 weeks, he has been having rectal bleeding. Stool is initially red and is now black. He has also had nausea and vomiting without any blood in the emesis. He feels generally weak and has been falling. He continues to consume alcohol and admits to drinking 40 ounces of beer today. He usually drinks 40-80 ounces of beer a day. He denies other drug use. He denies exposure to COVID-19.  On further review, patient was admitted in Oct and again in Nov at [facility] with reports of GIB. EGD in Oct just showed small non-bleeding varicies, no GAVE. Colonoscopy in Aug with polypectomy done after c/o rectal bleeding then as well and bleeding thought to be due to internal hemorrhoids.  In Nov he was admitted for reported bleeding and initial S.Tachycardia. Went into withdrawals the next day. Was hemoccult positive then, though HGB remained stable so they didn't scope him. Was found to have bacterial PNA during that admit.  Patient admitted to [facility] on 10/22/2019 with GI bleed.  He was given FFP, packed red blood cells, IV fluids, as he was noted to be hypotensive.  Patient was admitted to ICU.  Gastroenterology consulted, status post EGD. Continues to have abdominal pain, added GI cocktail with lidocaine.  Attained abdominal ultrasound showing small ascites, status post paracentesis yielding 2.3 L.  New events last 24 hours / Subjective: No new issues.  Continues to have some intermittent nausea and abdominal pain.  P.o. intake remains  adequate.  Assessment & Plan:   Principal Problem:   GI bleed Active Problems:   Hyponatremia   Benign essential HTN   Alcohol abuse   Alcoholic hepatitis   Sinus tachycardia   Pressure injury of skin   Mallory-Weiss tear   Protein-calorie malnutrition, severe   Hypovolemic shock -Secondary to hemorrhagic cause, GI losses as well as poor hydration and alcohol use -Resolved, blood pressure on the lower side but stable with SBP in the 90-100s  GI bleed, hematemesis, melena, acute blood loss anemia, symptomatic anemia -Transfused 1u FFP, 2uPRBC 10/23/2019 -EGD 10/24/2019 showed small esophageal varices without stigmata.  Small Mallory-Weiss tear without stigmata, which explains coffee-ground emesis.  Possible small proximal gastric varices.  Mild portal gastropathy.  Alcoholic hepatitis.  Chronic alcoholism.  Recommended to stop octreotide, continue PPI.  Patient to follow-up with his primary gastroenterologist in [facility] Dr. Marcelene Butte -Hemoglobin remained stable at 9.6  Abdominal pain  -Likely secondary to the above -Continue pain control -Continue sucralfate -Continue GI cocktail with lidocaine, pepcid -Abd [place]: Cirrhotic/steatotic appearance of liver with small volume ascites that is increased from 1/5 study. S/p paracentesis 1/11 yielding 2.3 L -We will evaluate need for repeat paracentesis next week  Hepatic cirrhosis with ascites secondary to alcohol use disorder -Continue lasix and spironolactone   Thrombocytopenia secondary to liver disease -Platelet count remains improved  Deconditioning -PT and OT recommended SNF  Zinc and vitamin C deficiency  -Appreciate dietitian, replace p.o.  Hypokalemia -Replace, trend   DVT prophylaxis: SCD Code Status: Full  Family Communication: No family at bedside Disposition Plan: Pending improvement in  abdominal pain. SNF placement pending, noted no bed offers so far   Consultants:   PCCM  GI   Antimicrobials:   Anti-infectives (From admission, onward)   Start     Dose/Rate Route Frequency Ordered Stop   10/23/19 0500  cefTRIAXone (ROCEPHIN) 2 g in sodium chloride 0.9 % 100 mL IVPB  Status:  Discontinued     2 g 200 mL/hr over 30 Minutes Intravenous Every 24 hours 10/23/19 0453 10/31/19 1121       Objective: Vitals:   11/01/19 1140 11/01/19 2004 11/02/19 0458 11/02/19 0908  BP: 101/67 103/87 91/70 106/76  Pulse: 86 90 66 88  Resp: 19 20    Temp: 98.1 F (36.7 C) 98.5 F (36.9 C) 98.2 F (36.8 C)   TempSrc: Oral Oral Oral   SpO2: 98% 95% 97%   Weight:      Height:        Intake/Output Summary (Last 24 hours) at 11/02/2019 0958 Last data filed at 11/02/2019 0911 Gross per 24 hour  Intake 1030 ml  Output 950 ml  Net 80 ml   Filed Weights   10/22/19 1837 10/24/19 0400 10/24/19 0941  Weight: 72.6 kg 72.7 kg 72.7 kg    Examination: General exam: Appears calm and comfortable  Respiratory system: Clear to auscultation. Respiratory effort normal. Cardiovascular system: S1 & S2 heard, RRR. No pedal edema. Gastrointestinal system: Abdomen is mildly distended, soft, mildly tender to palpation periumbilical.  Normal bowel sounds  Central nervous system: Alert and oriented. Non focal exam. Speech clear  Extremities: Symmetric in appearance bilaterally  Skin: No rashes, lesions or ulcers on exposed skin  Psychiatry: Judgement and insight appear stable. Mood & affect appropriate.    Data Reviewed: I have personally reviewed following labs and imaging studies  CBC: Recent Labs  Lab 10/29/19 0708 10/30/19 0256 10/31/19 0312 11/01/19 0228 11/02/19 0429  WBC 5.8 5.4 5.4 6.2 6.7  HGB 10.0* 9.4* 10.0* 9.8* 9.6*  HCT 28.7* 28.3* 30.5* 29.6* 29.3*  MCV 99.0 102.2* 103.0* 103.1* 102.8*  PLT 95* 106* 123* 136* 009*   Basic Metabolic Panel: Recent Labs  Lab 10/27/19 0123 10/27/19 0123 10/28/19 0106 10/28/19 0106 10/29/19 0708 10/30/19 0256 10/31/19 0312 11/01/19 0228  11/02/19 0429  NA 134*   < > 133*   < > 135 134* 135 134* 136  K 3.2*   < > 3.4*   < > 3.8 3.7 3.9 3.9 3.3*  CL 101   < > 98   < > 100 101 102 101 99  CO2 24   < > 28   < > 29 27 29 27 29   GLUCOSE 99   < > 107*   < > 88 92 96 89 92  BUN <5*   < > 5*   < > 7 7 6 7  5*  CREATININE 0.54*   < > 0.50*   < > 0.58* 0.51* 0.56* 0.49* 0.55*  CALCIUM 7.6*   < > 7.9*   < > 7.7* 7.7* 8.0* 7.8* 7.9*  MG 1.5*  --  1.9  --   --  1.7 1.7  --   --   PHOS 3.2  --  2.9  --   --  2.9  --   --   --    < > = values in this interval not displayed.   GFR: Estimated Creatinine Clearance: 103.1 mL/min (A) (by C-G formula based on SCr of 0.55 mg/dL (L)). Liver Function Tests: Recent Labs  Lab 10/27/19 0123 10/30/19 0256 10/31/19 0312 11/02/19 0429  AST 158* 88* 85* 69*  ALT 66* 40 40 32  ALKPHOS 151* 140* 144* 136*  BILITOT 3.8* 2.5* 2.7* 2.3*  PROT 5.3* 4.9* 5.3* 5.4*  ALBUMIN 1.8* 1.6* 1.7* 1.7*   No results for input(s): LIPASE, AMYLASE in the last 168 hours. No results for input(s): AMMONIA in the last 168 hours. Coagulation Profile: No results for input(s): INR, PROTIME in the last 168 hours. Cardiac Enzymes: No results for input(s): CKTOTAL, CKMB, CKMBINDEX, TROPONINI in the last 168 hours. BNP (last 3 results) No results for input(s): PROBNP in the last 8760 hours. HbA1C: No results for input(s): HGBA1C in the last 72 hours. CBG: No results for input(s): GLUCAP in the last 168 hours. Lipid Profile: No results for input(s): CHOL, HDL, LDLCALC, TRIG, CHOLHDL, LDLDIRECT in the last 72 hours. Thyroid Function Tests: No results for input(s): TSH, T4TOTAL, FREET4, T3FREE, THYROIDAB in the last 72 hours. Anemia Panel: No results for input(s): VITAMINB12, FOLATE, FERRITIN, TIBC, IRON, RETICCTPCT in the last 72 hours. Sepsis Labs: No results for input(s): PROCALCITON, LATICACIDVEN in the last 168 hours.  No results found for this or any previous visit (from the past 240 hour(s)).    Radiology  Studies: No results found.    Scheduled Meds: . vitamin C  500 mg Oral BID  . Chlorhexidine Gluconate Cloth  6 each Topical Daily  . famotidine  20 mg Oral BID  . feeding supplement (ENSURE ENLIVE)  237 mL Oral TID BM  . folic acid  1 mg Oral Daily  . furosemide  20 mg Oral Daily  . Gerhardt's butt cream   Topical TID  . mouth rinse  15 mL Mouth Rinse BID  . multivitamin with minerals  1 tablet Oral Daily  . pantoprazole  40 mg Oral BID AC  . spironolactone  25 mg Oral Daily  . sucralfate  1 g Oral TID WC & HS  . thiamine  100 mg Oral Daily   Or  . thiamine  100 mg Intravenous Daily  . zinc sulfate  220 mg Oral Daily   Continuous Infusions:    LOS: 10 days      Time spent: 25 minutes   Noralee Stain, DO [facility] 11/02/2019, 9:58 AM   Available via Epic secure chat 7am-7pm After these hours, please refer to coverage provider listed on [email or web address]

## 2019-11-02 NOTE — Progress Notes (Addendum)
Occupational Therapy Treatment Patient Details Name: Tom Bender MRN: [id number] DOB: 09/23/1961 Today's Date: 11/02/2019    History of present illness Pt is 59 year old male with PMH significant for alcohol abuse that is continuous, liver cirrhosis with varices and DVTs.  Patient was admitted with hematochezia and dark blood tinged emesis.  Pt s/p EGD revealed small esophageal and query small proximal gastric varices with mild lower Weiss tear.  Hgb is 9.9.  Potassium is 3.2.   OT comments  Pt making steady progress towards OT goals this session. Pt on BSC upon OT arrival. Pt required set-up assist for posterior pericare and supervision for sit>stand from [facility] with RW. Pt complete functional mobility greater than a household distance with RW and min guard- MIN A for balance. Pt continues to report dizziness with mobility and blurring of vision, reporting "seeing things" throughout the day such as "bugs" on wall. Pt on RA throughout session reporting SOB post ambulation. O2 showing poor pleth but feel pt was as low as 80% needing seated rest break and PLB for O2 to rebound to 90%. Pt expressed interest in alcohol rehab program vs SNF. Discussed various differences with pt and will let CSW know about possibility. Will continue to follow acutely and update DC recs per POC.    Follow Up Recommendations  SNF;Supervision/Assistance - 24 hour    Equipment Recommendations  Other (comment)(defer to next venue of care)    Recommendations for Other Services      Precautions / Restrictions Precautions Precautions: Fall Precaution Comments: monitor BP and O2 Restrictions Weight Bearing Restrictions: No       Mobility Bed Mobility Overal bed mobility: Independent;Modified Independent Bed Mobility: Sit to Supine       Sit to supine: Modified independent (Device/Increase time)   General bed mobility comments: no physical assist needed  Transfers Overall transfer level: Needs  assistance Equipment used: Rolling walker (2 wheeled) Transfers: Sit to/from Stand Sit to Stand: Min guard         General transfer comment: min guard for safety with RW from [facility]    Balance Overall balance assessment: Needs assistance Sitting-balance support: Feet supported Sitting balance-Leahy Scale: Fair     Standing balance support: During functional activity;Bilateral upper extremity supported;Single extremity supported Standing balance-Leahy Scale: Poor Standing balance comment: reliant on at least one UE support for balance                           ADL either performed or assessed with clinical judgement   ADL Overall ADL's : Needs assistance/impaired     Grooming: Wash/dry face;Standing;Supervision/safety           Upper Body Dressing : Sitting;Minimal assistance Upper Body Dressing Details (indicate cue type and reason): hospital gown     Toilet Transfer: Minimal assistance;RW;BSC;Ambulation Toilet Transfer Details (indicate cue type and reason): pt on BSC upon OT arrival, MIN guard sit>stand from [facility] Toileting- Clothing Manipulation and Hygiene: Set up;Sitting/lateral lean       Functional mobility during ADLs: Minimal assistance;Rolling walker;Min guard General ADL Comments: Pt continues to report dizziness with mobility but able to progress functional mobility distance this session with RW and min guard- min a. Pt on BSC upon OT arrival  needin set- up assist for hygiene.     Vision Baseline Vision/History: Wears glasses Wears Glasses: Reading only Patient Visual Report: Blurring of vision;Other (comment) Vision Assessment?: Yes Eye Alignment: Within Functional Limits Ocular Range of Motion: Within  Functional Limits Alignment/Gaze Preference: Within Defined Limits Tracking/Visual Pursuits: Able to track stimulus in all quads without difficulty Saccades: Within functional limits Convergence: Within functional limits Visual Fields: No  apparent deficits Additional Comments: reports blurry vision when standing and reports "seeing things" likely secondary to alcohol withdrawal   Perception     Praxis      Cognition Arousal/Alertness: Awake/alert Behavior During Therapy: WFL for tasks assessed/performed Overall Cognitive Status: Within Functional Limits for tasks assessed                                 General Comments: WFL limits for simple tasks        Exercises     Shoulder Instructions       General Comments Pt on RA throughout session reporting SOB post ambulation. O2 showing poor pleth but feel pt was a low as 80% needing seated rest break and PLB    Pertinent Vitals/ Pain       Pain Assessment: Faces Faces Pain Scale: Hurts little more Pain Location: stomach, back Pain Descriptors / Indicators: Discomfort;Guarding;Sore Pain Intervention(s): Limited activity within patient's tolerance;Monitored during session;Repositioned  Home Living                                          Prior Functioning/Environment              Frequency  Min 2X/week        Progress Toward Goals  OT Goals(current goals can now be found in the care plan section)  Progress towards OT goals: Progressing toward goals  Acute Rehab OT Goals Patient Stated Goal: feel better OT Goal Formulation: With patient Time For Goal Achievement: 11/09/19 Potential to Achieve Goals: Good  Plan Discharge plan remains appropriate    Co-evaluation                 AM-PAC OT "6 Clicks" Daily Activity     Outcome Measure   Help from another person eating meals?: None Help from another person taking care of personal grooming?: A Little Help from another person toileting, which includes using toliet, bedpan, or urinal?: A Little Help from another person bathing (including washing, rinsing, drying)?: A Little Help from another person to put on and taking off regular upper body clothing?:  None Help from another person to put on and taking off regular lower body clothing?: A Little 6 Click Score: 20    End of Session Equipment Utilized During Treatment: Gait belt;Rolling walker;Other (comment)(BSC)  OT Visit Diagnosis: Unsteadiness on feet (R26.81);Other abnormalities of gait and mobility (R26.89);History of falling (Z91.81);Muscle weakness (generalized) (M62.81)   Activity Tolerance Patient tolerated treatment well   Patient Left in bed;with call bell/phone within reach   Nurse Communication Mobility status        Time: 0973-5329 OT Time Calculation (min): 28 min  Charges: OT General Charges $OT Visit: 1 Visit OT Treatments $Self Care/Home Management : 23-37 mins  Lanier Clam., COTA/L Acute Rehabilitation Services [phone]    Ihor Gully 11/02/2019, 3:24 PM

## 2019-11-03 LAB — CBC
HCT: 30.7 % — ABNORMAL LOW (ref 39.0–52.0)
Hemoglobin: 9.9 g/dL — ABNORMAL LOW (ref 13.0–17.0)
MCH: 33.7 pg (ref 26.0–34.0)
MCHC: 32.2 g/dL (ref 30.0–36.0)
MCV: 104.4 fL — ABNORMAL HIGH (ref 80.0–100.0)
Platelets: 147 10*3/uL — ABNORMAL LOW (ref 150–400)
RBC: 2.94 MIL/uL — ABNORMAL LOW (ref 4.22–5.81)
RDW: 20.8 % — ABNORMAL HIGH (ref 11.5–15.5)
WBC: 7.4 10*3/uL (ref 4.0–10.5)
nRBC: 0 % (ref 0.0–0.2)

## 2019-11-03 LAB — BASIC METABOLIC PANEL
Anion gap: 7 (ref 5–15)
BUN: 5 mg/dL — ABNORMAL LOW (ref 6–20)
CO2: 27 mmol/L (ref 22–32)
Calcium: 8 mg/dL — ABNORMAL LOW (ref 8.9–10.3)
Chloride: 101 mmol/L (ref 98–111)
Creatinine, Ser: 0.57 mg/dL — ABNORMAL LOW (ref 0.61–1.24)
GFR calc Af Amer: >60 mL/min (ref >60)
GFR calc non Af Amer: >60 mL/min (ref >60)
Glucose, Bld: 94 mg/dL (ref 70–99)
Potassium: 3.9 mmol/L (ref 3.5–5.1)
Sodium: 135 mmol/L (ref 135–145)

## 2019-11-03 NOTE — Progress Notes (Signed)
PROGRESS NOTE    Tom Bender  SEG:[id number] DOB: 1961-09-20 DOA: 10/22/2019 PCP: Patient, No Pcp Per     Brief Narrative:  Tom Bender is a 59 y.o.malewith medical history significant ofEtOH abuse that is ongoing, cirrhosis and varices, DTs.  Patient presents to the ED with c/o hematochezia.He states that he has been having generalized abdominal pain since having gallbladder surgery last June. For the last 2-3 weeks, he has been having rectal bleeding. Stool is initially red and is now black. He has also had nausea and vomiting without any blood in the emesis. He feels generally weak and has been falling. He continues to consume alcohol and admits to drinking 40 ounces of beer today. He usually drinks 40-80 ounces of beer a day. He denies other drug use. He denies exposure to COVID-19.  On further review, patient was admitted in Oct and again in Nov at [facility] with reports of GIB. EGD in Oct just showed small non-bleeding varicies, no GAVE. Colonoscopy in Aug with polypectomy done after c/o rectal bleeding then as well and bleeding thought to be due to internal hemorrhoids.  In Nov he was admitted for reported bleeding and initial S.Tachycardia. Went into withdrawals the next day. Was hemoccult positive then, though HGB remained stable so they didn't scope him. Was found to have bacterial PNA during that admit.  Patient admitted to [facility] on 10/22/2019 with GI bleed.  He was given FFP, packed red blood cells, IV fluids, as he was noted to be hypotensive.  Patient was admitted to ICU.  Gastroenterology consulted, status post EGD. Continues to have abdominal pain, added GI cocktail with lidocaine.  Attained abdominal ultrasound showing small ascites, status post paracentesis yielding 2.3 L.  New events last 24 hours / Subjective: No new issues today   Assessment & Plan:   Principal Problem:   GI bleed Active Problems:   Hyponatremia   Benign essential HTN   Alcohol abuse   Alcoholic hepatitis   Sinus tachycardia   Pressure injury of skin   Mallory-Weiss tear   Protein-calorie malnutrition, severe   Hypovolemic shock -Secondary to hemorrhagic cause, GI losses as well as poor hydration and alcohol use -Resolved  GI bleed, hematemesis, melena, acute blood loss anemia, symptomatic anemia -Transfused 1u FFP, 2uPRBC 10/23/2019 -EGD 10/24/2019 showed small esophageal varices without stigmata.  Small Mallory-Weiss tear without stigmata, which explains coffee-ground emesis.  Possible small proximal gastric varices.  Mild portal gastropathy.  Alcoholic hepatitis.  Chronic alcoholism.  Recommended to stop octreotide, continue PPI.  Patient to follow-up with his primary gastroenterologist in [facility] Dr. Marcelene [place]te -Hemoglobin remained stable at 9.9  Abdominal pain  -Likely secondary to the above -Continue pain control -Continue sucralfate -Continue GI cocktail with lidocaine, pepcid -Abd [place]: Cirrhotic/steatotic appearance of liver with small volume ascites that is increased from 1/5 study. S/p paracentesis 1/11 yielding 2.3 L -We will evaluate need for repeat paracentesis next week  Hepatic cirrhosis with ascites secondary to alcohol use disorder -Continue lasix and spironolactone   Thrombocytopenia secondary to liver disease -Platelet count remains improved  Deconditioning -PT and OT recommended SNF  Zinc and vitamin C deficiency  -Appreciate dietitian, replace p.o.  Dental caries -Patient will need outpatient dental follow-up     DVT prophylaxis: SCD Code Status: Full  Family Communication: No family at bedside Disposition Plan: Pending improvement in abdominal pain. SNF placement pending, noted no bed offers so far   Consultants:   PCCM  GI  Antimicrobials:  Anti-infectives (From admission, onward)   Start     Dose/Rate Route Frequency Ordered Stop   10/23/19 0500  cefTRIAXone (ROCEPHIN) 2  g in sodium chloride 0.9 % 100 mL IVPB  Status:  Discontinued     2 g 200 mL/hr over 30 Minutes Intravenous Every 24 hours 10/23/19 0453 10/31/19 1121       Objective: Vitals:   11/02/19 2300 11/03/19 0345 11/03/19 0732 11/03/19 0737  BP: 118/60 100/71 122/87 104/75  Pulse: 79 78 77 85  Resp: 20 20 18 18   Temp: 100 F (37.8 C) 98 F (36.7 C) 98.7 F (37.1 C) 98.2 F (36.8 C)  TempSrc: Oral Oral Oral Oral  SpO2: 99% 95% 97% 96%  Weight:      Height:        Intake/Output Summary (Last 24 hours) at 11/03/2019 1357 Last data filed at 11/03/2019 0346 Gross per 24 hour  Intake 240 ml  Output 1175 ml  Net -935 ml   Filed Weights   10/22/19 1837 10/24/19 0400 10/24/19 0941  Weight: 72.6 kg 72.7 kg 72.7 kg    Examination: General exam: Appears calm and comfortable  Respiratory system: Clear to auscultation. Respiratory effort normal. Cardiovascular system: S1 & S2 heard, RRR. No pedal edema. Gastrointestinal system: Abdomen is mildly distended, soft. Normal bowel sounds heard. Central nervous system: Alert and oriented. Non focal exam. Speech clear  Extremities: Symmetric in appearance bilaterally  Skin: No rashes, lesions or ulcers on exposed skin  Psychiatry: Judgement and insight appear stable. Mood & affect appropriate.    Data Reviewed: I have personally reviewed following labs and imaging studies  CBC: Recent Labs  Lab 10/30/19 0256 10/31/19 0312 11/01/19 0228 11/02/19 0429 11/03/19 0259  WBC 5.4 5.4 6.2 6.7 7.4  HGB 9.4* 10.0* 9.8* 9.6* 9.9*  HCT 28.3* 30.5* 29.6* 29.3* 30.7*  MCV 102.2* 103.0* 103.1* 102.8* 104.4*  PLT 106* 123* 136* 133* 353*   Basic Metabolic Panel: Recent Labs  Lab 10/28/19 0106 10/29/19 0708 10/30/19 0256 10/31/19 0312 11/01/19 0228 11/02/19 0429 11/03/19 0259  NA 133*   < > 134* 135 134* 136 135  K 3.4*   < > 3.7 3.9 3.9 3.3* 3.9  CL 98   < > 101 102 101 99 101  CO2 28   < > 27 29 27 29 27   GLUCOSE 107*   < > 92 96 89 92  94  BUN 5*   < > 7 6 7  5* 5*  CREATININE 0.50*   < > 0.51* 0.56* 0.49* 0.55* 0.57*  CALCIUM 7.9*   < > 7.7* 8.0* 7.8* 7.9* 8.0*  MG 1.9  --  1.7 1.7  --   --   --   PHOS 2.9  --  2.9  --   --   --   --    < > = values in this interval not displayed.   GFR: Estimated Creatinine Clearance: 103.1 mL/min (A) (by C-G formula based on SCr of 0.57 mg/dL (L)). Liver Function Tests: Recent Labs  Lab 10/30/19 0256 10/31/19 0312 11/02/19 0429  AST 88* 85* 69*  ALT 40 40 32  ALKPHOS 140* 144* 136*  BILITOT 2.5* 2.7* 2.3*  PROT 4.9* 5.3* 5.4*  ALBUMIN 1.6* 1.7* 1.7*   No results for input(s): LIPASE, AMYLASE in the last 168 hours. No results for input(s): AMMONIA in the last 168 hours. Coagulation Profile: No results for input(s): INR, PROTIME in the last 168 hours. Cardiac Enzymes:  No results for input(s): CKTOTAL, CKMB, CKMBINDEX, TROPONINI in the last 168 hours. BNP (last 3 results) No results for input(s): PROBNP in the last 8760 hours. HbA1C: No results for input(s): HGBA1C in the last 72 hours. CBG: No results for input(s): GLUCAP in the last 168 hours. Lipid Profile: No results for input(s): CHOL, HDL, LDLCALC, TRIG, CHOLHDL, LDLDIRECT in the last 72 hours. Thyroid Function Tests: No results for input(s): TSH, T4TOTAL, FREET4, T3FREE, THYROIDAB in the last 72 hours. Anemia Panel: No results for input(s): VITAMINB12, FOLATE, FERRITIN, TIBC, IRON, RETICCTPCT in the last 72 hours. Sepsis Labs: No results for input(s): PROCALCITON, LATICACIDVEN in the last 168 hours.  No results found for this or any previous visit (from the past 240 hour(s)).    Radiology Studies: DG Orthopantogram  Result Date: 11/03/2019 CLINICAL DATA:  Initial evaluation for dental caries. EXAM: ORTHOPANTOGRAM/PANORAMIC COMPARISON:  None. FINDINGS: Dental caries are seen about the mandibular cuspid bilaterally (tooth numbers 22 and 27). Mandible is demineralized at the base of the left cuspid. The left  first bicuspid is eroded and carious (tooth number 21). Dental carie present at the adjacent left second bicuspid (tooth number 20). Patient is otherwise edentulous. IMPRESSION: Multiple dental caries as above. Electronically Signed   By: Rise Mu M.D.   On: 11/03/2019 03:02      Scheduled Meds: . vitamin C  500 mg Oral BID  . Chlorhexidine Gluconate Cloth  6 each Topical Daily  . famotidine  20 mg Oral BID  . feeding supplement (ENSURE ENLIVE)  237 mL Oral TID BM  . folic acid  1 mg Oral Daily  . furosemide  20 mg Oral Daily  . Gerhardt's butt cream   Topical TID  . mouth rinse  15 mL Mouth Rinse BID  . multivitamin with minerals  1 tablet Oral Daily  . pantoprazole  40 mg Oral BID AC  . spironolactone  50 mg Oral Daily  . sucralfate  1 g Oral TID WC & HS  . thiamine  100 mg Oral Daily  . zinc sulfate  220 mg Oral Daily   Continuous Infusions:    LOS: 11 days      Time spent: 25 minutes   Noralee Stain, DO [facility] 11/03/2019, 1:57 PM   Available via Epic secure chat 7am-7pm After these hours, please refer to coverage provider listed on [email or web address]

## 2019-11-04 MED ORDER — METHOCARBAMOL 500 MG PO TABS
500.0000 mg | ORAL_TABLET | Freq: Three times a day (TID) | ORAL | Status: DC | PRN
  Administered 2019-11-04 – 2019-11-14 (×15): 500 mg via ORAL
  Filled 2019-11-04 (×15): qty 1

## 2019-11-04 NOTE — Plan of Care (Signed)
Poc progressing.  

## 2019-11-04 NOTE — Progress Notes (Signed)
PROGRESS NOTE    Tom Bender  [id number] DOB: [birth year] DOA: 10/22/2019 PCP: Patient, No Pcp Per     Brief Narrative:  Tom Bender is a 59 y.o.malewith medical history significant ofEtOH abuse that is ongoing, cirrhosis and varices, DTs.  Patient presents to the ED with c/o hematochezia.He states that he has been having generalized abdominal pain since having gallbladder surgery last June. For the last 2-3 weeks, he has been having rectal bleeding. Stool is initially red and is now black. He has also had nausea and vomiting without any blood in the emesis. He feels generally weak and has been falling. He continues to consume alcohol and admits to drinking 40 ounces of beer today. He usually drinks 40-80 ounces of beer a day. He denies other drug use. He denies exposure to COVID-19.  On further review, patient was admitted in Oct and again in Nov at [facility] with reports of GIB. EGD in Oct just showed small non-bleeding varicies, no GAVE. Colonoscopy in Aug with polypectomy done after c/o rectal bleeding then as well and bleeding thought to be due to internal hemorrhoids.  In Nov he was admitted for reported bleeding and initial S.Tachycardia. Went into withdrawals the next day. Was hemoccult positive then, though HGB remained stable so they didn't scope him. Was found to have bacterial PNA during that admit.  Patient admitted to [facility] on 10/22/2019 with GI bleed.  He was given FFP, packed red blood cells, IV fluids, as he was noted to be hypotensive.  Patient was admitted to ICU.  Gastroenterology consulted, status post EGD. Continues to have abdominal pain, added GI cocktail with lidocaine.  Attained abdominal ultrasound showing small ascites, status post paracentesis yielding 2.3 L.  New events last 24 hours / Subjective: Complains of some back muscle spasms.   Assessment & Plan:   Principal Problem:   GI bleed Active Problems:    Hyponatremia   Benign essential HTN   Alcohol abuse   Alcoholic hepatitis   Sinus tachycardia   Pressure injury of skin   Mallory-Weiss tear   Protein-calorie malnutrition, severe   Hypovolemic shock -Secondary to hemorrhagic cause, GI losses as well as poor hydration and alcohol use -Resolved  GI bleed, hematemesis, melena, acute blood loss anemia, symptomatic anemia -Transfused 1u FFP, 2uPRBC 10/23/2019 -EGD 10/24/2019 showed small esophageal varices without stigmata.  Small Mallory-Weiss tear without stigmata, which explains coffee-ground emesis.  Possible small proximal gastric varices.  Mild portal gastropathy.  Alcoholic hepatitis.  Chronic alcoholism.  Recommended to stop octreotide, continue PPI.  Patient to follow-up with his primary gastroenterologist in [facility] Dr. Alonza Bogus -Hemoglobin has remained stable  Abdominal pain  -Likely secondary to the above -Continue pain control -Continue sucralfate -Continue GI cocktail with lidocaine, pepcid -Abd [place]: Cirrhotic/steatotic appearance of liver with small volume ascites that is increased from 1/5 study. S/p paracentesis 1/11 yielding 2.3 L -We will evaluate need for repeat paracentesis next week  Hepatic cirrhosis with ascites secondary to alcohol use disorder -Continue lasix and spironolactone   Thrombocytopenia secondary to liver disease -Platelet count remains improved  Deconditioning -PT and OT recommended SNF  Zinc and vitamin C deficiency  -Appreciate dietitian, replace p.o.  Dental caries -Patient will need outpatient dental follow-up     DVT prophylaxis: SCD Code Status: Full  Family Communication: No family at bedside Disposition Plan: Pending improvement in abdominal pain. SNF placement pending, noted no bed offers so far   Consultants:   PCCM  GI   Antimicrobials:  Anti-infectives (From admission, onward)   Start     Dose/Rate Route Frequency Ordered Stop   10/23/19 0500   cefTRIAXone (ROCEPHIN) 2 g in sodium chloride 0.9 % 100 mL IVPB  Status:  Discontinued     2 g 200 mL/hr over 30 Minutes Intravenous Every 24 hours 10/23/19 0453 10/31/19 1121       Objective: Vitals:   11/03/19 0737 11/03/19 1928 11/03/19 2021 11/04/19 0444  BP: 104/75 (!) 88/66 97/69 106/87  Pulse: 85 85  85  Resp: 18 17  19   Temp: 98.2 F (36.8 C) 98.2 F (36.8 C)  98.5 F (36.9 C)  TempSrc: Oral Oral  Oral  SpO2: 96% 97%  98%  Weight:      Height:        Intake/Output Summary (Last 24 hours) at 11/04/2019 1029 Last data filed at 11/04/2019 0446 Gross per 24 hour  Intake 220 ml  Output 2725 ml  Net -2505 ml   Filed Weights   10/22/19 1837 10/24/19 0400 10/24/19 0941  Weight: 72.6 kg 72.7 kg 72.7 kg    Examination: General exam: Appears calm and comfortable  Respiratory system: Clear to auscultation. Respiratory effort normal. Cardiovascular system: S1 & S2 heard, RRR. No pedal edema. Gastrointestinal system: Abdomen is mildly distended, soft and nontender. Normal bowel sounds heard. Central nervous system: Alert and oriented. Non focal exam. Speech clear  Extremities: Symmetric in appearance bilaterally  Skin: No rashes, lesions or ulcers on exposed skin  Psychiatry: Judgement and insight appear stable. Mood & affect appropriate.    Data Reviewed: I have personally reviewed following labs and imaging studies  CBC: Recent Labs  Lab 10/30/19 0256 10/31/19 0312 11/01/19 0228 11/02/19 0429 11/03/19 0259  WBC 5.4 5.4 6.2 6.7 7.4  HGB 9.4* 10.0* 9.8* 9.6* 9.9*  HCT 28.3* 30.5* 29.6* 29.3* 30.7*  MCV 102.2* 103.0* 103.1* 102.8* 104.4*  PLT 106* 123* 136* 133* 147*   Basic Metabolic Panel: Recent Labs  Lab 10/30/19 0256 10/31/19 0312 11/01/19 0228 11/02/19 0429 11/03/19 0259  NA 134* 135 134* 136 135  K 3.7 3.9 3.9 3.3* 3.9  CL 101 102 101 99 101  CO2 27 29 27 29 27   GLUCOSE 92 96 89 92 94  BUN 7 6 7  5* 5*  CREATININE 0.51* 0.56* 0.49* 0.55* 0.57*   CALCIUM 7.7* 8.0* 7.8* 7.9* 8.0*  MG 1.7 1.7  --   --   --   PHOS 2.9  --   --   --   --    GFR: Estimated Creatinine Clearance: 103.1 mL/min (A) (by C-G formula based on SCr of 0.57 mg/dL (L)). Liver Function Tests: Recent Labs  Lab 10/30/19 0256 10/31/19 0312 11/02/19 0429  AST 88* 85* 69*  ALT 40 40 32  ALKPHOS 140* 144* 136*  BILITOT 2.5* 2.7* 2.3*  PROT 4.9* 5.3* 5.4*  ALBUMIN 1.6* 1.7* 1.7*   No results for input(s): LIPASE, AMYLASE in the last 168 hours. No results for input(s): AMMONIA in the last 168 hours. Coagulation Profile: No results for input(s): INR, PROTIME in the last 168 hours. Cardiac Enzymes: No results for input(s): CKTOTAL, CKMB, CKMBINDEX, TROPONINI in the last 168 hours. BNP (last 3 results) No results for input(s): PROBNP in the last 8760 hours. HbA1C: No results for input(s): HGBA1C in the last 72 hours. CBG: No results for input(s): GLUCAP in the last 168 hours. Lipid Profile: No results for input(s): CHOL, HDL, LDLCALC, TRIG, CHOLHDL,  LDLDIRECT in the last 72 hours. Thyroid Function Tests: No results for input(s): TSH, T4TOTAL, FREET4, T3FREE, THYROIDAB in the last 72 hours. Anemia Panel: No results for input(s): VITAMINB12, FOLATE, FERRITIN, TIBC, IRON, RETICCTPCT in the last 72 hours. Sepsis Labs: No results for input(s): PROCALCITON, LATICACIDVEN in the last 168 hours.  No results found for this or any previous visit (from the past 240 hour(s)).    Radiology Studies: DG Orthopantogram  Result Date: 11/03/2019 CLINICAL DATA:  Initial evaluation for dental caries. EXAM: ORTHOPANTOGRAM/PANORAMIC COMPARISON:  None. FINDINGS: Dental caries are seen about the mandibular cuspid bilaterally (tooth numbers 22 and 27). Mandible is demineralized at the base of the left cuspid. The left first bicuspid is eroded and carious (tooth number 21). Dental carie present at the adjacent left second bicuspid (tooth number 20). Patient is otherwise edentulous.  IMPRESSION: Multiple dental caries as above. Electronically Signed   By: Rise Mu M.D.   On: 11/03/2019 03:02      Scheduled Meds: . vitamin C  500 mg Oral BID  . Chlorhexidine Gluconate Cloth  6 each Topical Daily  . famotidine  20 mg Oral BID  . feeding supplement (ENSURE ENLIVE)  237 mL Oral TID BM  . folic acid  1 mg Oral Daily  . furosemide  20 mg Oral Daily  . Gerhardt's butt cream   Topical TID  . mouth rinse  15 mL Mouth Rinse BID  . multivitamin with minerals  1 tablet Oral Daily  . pantoprazole  40 mg Oral BID AC  . spironolactone  50 mg Oral Daily  . sucralfate  1 g Oral TID WC & HS  . thiamine  100 mg Oral Daily  . zinc sulfate  220 mg Oral Daily   Continuous Infusions:    LOS: 12 days      Time spent: 25 minutes   Noralee Stain, DO [facility] 11/04/2019, 10:29 AM   Available via Epic secure chat 7am-7pm After these hours, please refer to coverage provider listed on [email or web address]

## 2019-11-05 ENCOUNTER — Inpatient Hospital Stay (HOSPITAL_COMMUNITY): Payer: Self-pay

## 2019-11-05 HISTORY — PX: IR PARACENTESIS: IMG2679

## 2019-11-05 LAB — BASIC METABOLIC PANEL
Anion gap: 6 (ref 5–15)
BUN: <5 mg/dL — ABNORMAL LOW (ref 6–20)
CO2: 26 mmol/L (ref 22–32)
Calcium: 8 mg/dL — ABNORMAL LOW (ref 8.9–10.3)
Chloride: 106 mmol/L (ref 98–111)
Creatinine, Ser: 0.55 mg/dL — ABNORMAL LOW (ref 0.61–1.24)
GFR calc Af Amer: >60 mL/min (ref >60)
GFR calc non Af Amer: >60 mL/min (ref >60)
Glucose, Bld: 89 mg/dL (ref 70–99)
Potassium: 3.9 mmol/L (ref 3.5–5.1)
Sodium: 138 mmol/L (ref 135–145)

## 2019-11-05 LAB — CBC
HCT: 29.8 % — ABNORMAL LOW (ref 39.0–52.0)
Hemoglobin: 9.8 g/dL — ABNORMAL LOW (ref 13.0–17.0)
MCH: 34.3 pg — ABNORMAL HIGH (ref 26.0–34.0)
MCHC: 32.9 g/dL (ref 30.0–36.0)
MCV: 104.2 fL — ABNORMAL HIGH (ref 80.0–100.0)
Platelets: 162 10*3/uL (ref 150–400)
RBC: 2.86 MIL/uL — ABNORMAL LOW (ref 4.22–5.81)
RDW: 20.4 % — ABNORMAL HIGH (ref 11.5–15.5)
WBC: 6.6 10*3/uL (ref 4.0–10.5)
nRBC: 0 % (ref 0.0–0.2)

## 2019-11-05 MED ORDER — LIDOCAINE HCL 1 % IJ SOLN
INTRAMUSCULAR | Status: AC
  Filled 2019-11-05: qty 20

## 2019-11-05 MED ORDER — LIDOCAINE HCL (PF) 1 % IJ SOLN
INTRAMUSCULAR | Status: DC | PRN
  Administered 2019-11-05: 10 mL

## 2019-11-05 NOTE — TOC Progression Note (Signed)
Transition of Care [facility]) - Progression Note    Patient Details  Name: Tom Bender MRN: [id number] Date of Birth: 1961-04-11  Transition of Care [facility]) CM/SW Contact  Mearl Latin, LCSW Phone Number: 11/05/2019, 6:31 PM  Clinical Narrative:    Patient still does not have SNF bed offers. TOC to continue search.    Expected Discharge Plan: Skilled Nursing Facility Barriers to Discharge: Inadequate or no insurance, Continued Medical Work up, SNF Pending bed offer  Expected Discharge Plan and Services Expected Discharge Plan: Skilled Nursing Facility       Living arrangements for the past 2 months: Single Family Home                                       Social Determinants of Health (SDOH) Interventions    Readmission Risk Interventions No flowsheet data found.

## 2019-11-05 NOTE — Procedures (Signed)
PROCEDURE SUMMARY:  Successful image-guided paracentesis from the left lower abdomen.  Yielded 2.5 liters of hazy yellow fluid.  No immediate complications.  EBL: zero Patient tolerated well.   Specimen was not sent for labs.  Please see imaging section of Epic for full dictation.  Villa Herb PA-C 11/05/2019 10:50 AM

## 2019-11-05 NOTE — Progress Notes (Signed)
Physical Therapy Treatment Patient Details Name: Tom Bender MRN: [id number] DOB: Oct 03, 1961 Today's Date: 11/05/2019    History of Present Illness Pt is 59 year old male with PMH significant for alcohol abuse that is continuous, liver cirrhosis with varices and DVTs.  Patient was admitted with hematochezia and dark blood tinged emesis.  Pt s/p EGD revealed small esophageal and query small proximal gastric varices with mild lower Weiss tear.  Hgb is 9.9.  Potassium is 3.2.    PT Comments    Patient seen for mobility progression. Continue to progress as tolerated with anticipated d/c to SNF for further skilled PT services.     Follow Up Recommendations  SNF     Equipment Recommendations  Other (comment)(TBD next venue)    Recommendations for Other Services       Precautions / Restrictions Precautions Precautions: Fall Restrictions Weight Bearing Restrictions: No    Mobility  Bed Mobility Overal bed mobility: Modified Independent Bed Mobility: Supine to Sit           General bed mobility comments: use of rail  Transfers Overall transfer level: Needs assistance Equipment used: Rolling walker (2 wheeled) Transfers: Sit to/from Stand Sit to Stand: Min guard;Min assist         General transfer comment: pt stood initially without use of RW with min A to power up and for balance; second trial min guard for safety with use of RW  Ambulation/Gait Ambulation/Gait assistance: Min assist;Min guard Gait Distance (Feet): 150 Feet Assistive device: Rolling walker (2 wheeled);1 person hand held assist Gait Pattern/deviations: Step-through pattern;Decreased stride length;Trunk flexed;Ataxic Gait velocity: decreased   General Gait Details: initially ambulated in room (20 ft) with HHA and pt reaching for objects to balance; without AD pt with increased ataxic movements and further difficulty with bilat LE coordination; RW used rest of session for increased safety/pt comfort; 2  standing rest breaks required due to 2-3/4 DOE; SpO2 WNL    Stairs             Wheelchair Mobility    Modified Rankin (Stroke Patients Only)       Balance Overall balance assessment: Needs assistance Sitting-balance support: Feet supported Sitting balance-Leahy Scale: Fair     Standing balance support: During functional activity;Bilateral upper extremity supported;Single extremity supported Standing balance-Leahy Scale: Poor Standing balance comment: reliant on at least one UE support for balance                            Cognition Arousal/Alertness: Awake/alert Behavior During Therapy: WFL for tasks assessed/performed Overall Cognitive Status: Within Functional Limits for tasks assessed                                 General Comments: WFL limits for simple tasks      Exercises      General Comments        Pertinent Vitals/Pain Pain Assessment: Faces Faces Pain Scale: Hurts little more Pain Location: stomach, back Pain Descriptors / Indicators: Discomfort;Guarding;Sore Pain Intervention(s): Limited activity within patient's tolerance;Monitored during session;Repositioned    Home Living                      Prior Function            PT Goals (current goals can now be found in the care plan section) Progress towards PT goals: Progressing toward  goals    Frequency    Min 2X/week      PT Plan Current plan remains appropriate    Co-evaluation              AM-PAC PT "6 Clicks" Mobility   Outcome Measure  Help needed turning from your back to your side while in a flat bed without using bedrails?: A Little Help needed moving from lying on your back to sitting on the side of a flat bed without using bedrails?: A Little Help needed moving to and from a bed to a chair (including a wheelchair)?: A Little Help needed standing up from a chair using your arms (e.g., wheelchair or bedside chair)?: A Little Help  needed to walk in hospital room?: A Little Help needed climbing 3-5 steps with a railing? : A Lot 6 Click Score: 17    End of Session Equipment Utilized During Treatment: Gait belt Activity Tolerance: Patient tolerated treatment well Patient left: with call bell/phone within reach;in chair;Other (comment)(OT present end of session) Nurse Communication: Mobility status PT Visit Diagnosis: Unsteadiness on feet (R26.81);Muscle weakness (generalized) (M62.81)     Time: 8676-1950 PT Time Calculation (min) (ACUTE ONLY): 22 min  Charges:  $Gait Training: 8-22 mins                     Erline Levine, PTA Acute Rehabilitation Services Pager: [phone] Office: [phone]     Carolynne Edouard 11/05/2019, 4:19 PM

## 2019-11-05 NOTE — Progress Notes (Signed)
PROGRESS NOTE    Tom Bender  WUJ:[id number] DOB: [birth year] DOA: 10/22/2019 PCP: Patient, No Pcp Per     Brief Narrative:  Tom Bender is a 59 y.o.malewith medical history significant ofEtOH abuse that is ongoing, cirrhosis and varices, DTs.  Patient presents to the ED with c/o hematochezia.He states that he has been having generalized abdominal pain since having gallbladder surgery last June. For the last 2-3 weeks, he has been having rectal bleeding. Stool is initially red and is now black. He has also had nausea and vomiting without any blood in the emesis. He feels generally weak and has been falling. He continues to consume alcohol and admits to drinking 40 ounces of beer today. He usually drinks 40-80 ounces of beer a day. He denies other drug use. He denies exposure to COVID-19.  On further review, patient was admitted in Oct and again in Nov at [facility] with reports of GIB. EGD in Oct just showed small non-bleeding varicies, no GAVE. Colonoscopy in Aug with polypectomy done after c/o rectal bleeding then as well and bleeding thought to be due to internal hemorrhoids.  In Nov he was admitted for reported bleeding and initial S.Tachycardia. Went into withdrawals the next day. Was hemoccult positive then, though HGB remained stable so they didn't scope him. Was found to have bacterial PNA during that admit.  Patient admitted to [facility] on 10/22/2019 with GI bleed.  He was given FFP, packed red blood cells, IV fluids, as he was noted to be hypotensive.  Patient was admitted to ICU.  Gastroenterology consulted, status post EGD. Continues to have abdominal pain, added GI cocktail with lidocaine.  Attained abdominal ultrasound showing small ascites, status post paracentesis yielding 2.3 L.  New events last 24 hours / Subjective: Having some nausea and abdominal fullness. Was unable to finish breakfast today.    Assessment & Plan:     Principal Problem:   GI bleed Active Problems:   Hyponatremia   Benign essential HTN   Alcohol abuse   Alcoholic hepatitis   Sinus tachycardia   Pressure injury of skin   Mallory-Weiss tear   Protein-calorie malnutrition, severe   Hypovolemic shock -Secondary to hemorrhagic cause, GI losses as well as poor hydration and alcohol use -Resolved  GI bleed, hematemesis, melena, acute blood loss anemia, symptomatic anemia -Transfused 1u FFP, 2uPRBC 10/23/2019 -EGD 10/24/2019 showed small esophageal varices without stigmata.  Small Mallory-Weiss tear without stigmata, which explains coffee-ground emesis.  Possible small proximal gastric varices.  Mild portal gastropathy.  Alcoholic hepatitis.  Chronic alcoholism.  Recommended to stop octreotide, continue PPI.  Patient to follow-up with his primary gastroenterologist in [facility] Dr. Marcelene Butte -Hemoglobin has remained stable  Abdominal pain  -Likely secondary to the above -Continue pain control -Continue sucralfate -Continue GI cocktail with lidocaine, pepcid -Abd [place]: Cirrhotic/steatotic appearance of liver with small volume ascites that is increased from 1/5 study. S/p paracentesis 1/11 yielding 2.3 L -US paracentesis ordered today   Hepatic cirrhosis with ascites secondary to alcohol use disorder -Continue lasix and spironolactone   Thrombocytopenia secondary to liver disease -Resolved   Deconditioning -PT and OT recommended SNF  Zinc and vitamin C deficiency  -Appreciate dietitian, replace p.o.  Dental caries -Patient will need outpatient dental follow-up     DVT prophylaxis: SCD Code Status: Full  Family Communication: No family at bedside Disposition Plan: Pending improvement in abdominal pain. SNF placement pending, noted no bed offers so far. Paracentesis ordered today.  Consultants:   PCCM  GI   Antimicrobials:  Anti-infectives (From admission, onward)   Start     Dose/Rate Route Frequency Ordered  Stop   10/23/19 0500  cefTRIAXone (ROCEPHIN) 2 g in sodium chloride 0.9 % 100 mL IVPB  Status:  Discontinued     2 g 200 mL/hr over 30 Minutes Intravenous Every 24 hours 10/23/19 0453 10/31/19 1121       Objective: Vitals:   11/04/19 1800 11/04/19 1927 11/05/19 0326 11/05/19 0700  BP: 122/86 106/84 104/84 107/85  Pulse:  (!) 101 86 95  Resp:  20 19 18   Temp:  98.1 F (36.7 C) 98.7 F (37.1 C) 98.4 F (36.9 C)  TempSrc:  Oral Oral Oral  SpO2:  98% 99% 97%  Weight:      Height:        Intake/Output Summary (Last 24 hours) at 11/05/2019 1020 Last data filed at 11/05/2019 1000 Gross per 24 hour  Intake 120 ml  Output 4775 ml  Net -4655 ml   Filed Weights   10/22/19 1837 10/24/19 0400 10/24/19 0941  Weight: 72.6 kg 72.7 kg 72.7 kg    Examination: General exam: Appears calm and comfortable  Respiratory system: Clear to auscultation. Respiratory effort normal. Cardiovascular system: S1 & S2 heard, RRR. No pedal edema. Gastrointestinal system: Abdomen is distended but soft and +BS  Central nervous system: Alert and oriented. Non focal exam. Speech clear  Extremities: Symmetric in appearance bilaterally  Skin: No rashes, lesions or ulcers on exposed skin  Psychiatry: Judgement and insight appear stable. Mood & affect appropriate.    Data Reviewed: I have personally reviewed following labs and imaging studies  CBC: Recent Labs  Lab 10/31/19 0312 11/01/19 0228 11/02/19 0429 11/03/19 0259 11/05/19 0232  WBC 5.4 6.2 6.7 7.4 6.6  HGB 10.0* 9.8* 9.6* 9.9* 9.8*  HCT 30.5* 29.6* 29.3* 30.7* 29.8*  MCV 103.0* 103.1* 102.8* 104.4* 104.2*  PLT 123* 136* 133* 147* 509   Basic Metabolic Panel: Recent Labs  Lab 10/30/19 0256 10/30/19 0256 10/31/19 0312 11/01/19 0228 11/02/19 0429 11/03/19 0259 11/05/19 0232  NA 134*   < > 135 134* 136 135 138  K 3.7   < > 3.9 3.9 3.3* 3.9 3.9  CL 101   < > 102 101 99 101 106  CO2 27   < > 29 27 29 27 26   GLUCOSE 92   < > 96 89 92  94 89  BUN 7   < > 6 7 5* 5* <5*  CREATININE 0.51*   < > 0.56* 0.49* 0.55* 0.57* 0.55*  CALCIUM 7.7*   < > 8.0* 7.8* 7.9* 8.0* 8.0*  MG 1.7  --  1.7  --   --   --   --   PHOS 2.9  --   --   --   --   --   --    < > = values in this interval not displayed.   GFR: Estimated Creatinine Clearance: 103.1 mL/min (A) (by C-G formula based on SCr of 0.55 mg/dL (L)). Liver Function Tests: Recent Labs  Lab 10/30/19 0256 10/31/19 0312 11/02/19 0429  AST 88* 85* 69*  ALT 40 40 32  ALKPHOS 140* 144* 136*  BILITOT 2.5* 2.7* 2.3*  PROT 4.9* 5.3* 5.4*  ALBUMIN 1.6* 1.7* 1.7*   No results for input(s): LIPASE, AMYLASE in the last 168 hours. No results for input(s): AMMONIA in the last 168 hours. Coagulation Profile: No results for input(s):  INR, PROTIME in the last 168 hours. Cardiac Enzymes: No results for input(s): CKTOTAL, CKMB, CKMBINDEX, TROPONINI in the last 168 hours. BNP (last 3 results) No results for input(s): PROBNP in the last 8760 hours. HbA1C: No results for input(s): HGBA1C in the last 72 hours. CBG: No results for input(s): GLUCAP in the last 168 hours. Lipid Profile: No results for input(s): CHOL, HDL, LDLCALC, TRIG, CHOLHDL, LDLDIRECT in the last 72 hours. Thyroid Function Tests: No results for input(s): TSH, T4TOTAL, FREET4, T3FREE, THYROIDAB in the last 72 hours. Anemia Panel: No results for input(s): VITAMINB12, FOLATE, FERRITIN, TIBC, IRON, RETICCTPCT in the last 72 hours. Sepsis Labs: No results for input(s): PROCALCITON, LATICACIDVEN in the last 168 hours.  No results found for this or any previous visit (from the past 240 hour(s)).    Radiology Studies: No results found.    Scheduled Meds: . vitamin C  500 mg Oral BID  . Chlorhexidine Gluconate Cloth  6 each Topical Daily  . famotidine  20 mg Oral BID  . feeding supplement (ENSURE ENLIVE)  237 mL Oral TID BM  . folic acid  1 mg Oral Daily  . furosemide  20 mg Oral Daily  . Gerhardt's butt cream    Topical TID  . mouth rinse  15 mL Mouth Rinse BID  . multivitamin with minerals  1 tablet Oral Daily  . pantoprazole  40 mg Oral BID AC  . spironolactone  50 mg Oral Daily  . sucralfate  1 g Oral TID WC & HS  . thiamine  100 mg Oral Daily  . zinc sulfate  220 mg Oral Daily   Continuous Infusions:    LOS: 13 days      Time spent: 25 minutes   Noralee Stain, DO [facility] 11/05/2019, 10:20 AM   Available via Epic secure chat 7am-7pm After these hours, please refer to coverage provider listed on [email or web address]

## 2019-11-05 NOTE — Progress Notes (Signed)
Occupational Therapy Treatment Patient Details Name: Tom Bender MRN: [id number] DOB: 12-26-1960 Today's Date: 11/05/2019    History of present illness Pt is 59 year old male with PMH significant for alcohol abuse that is continuous, liver cirrhosis with varices and DVTs.  Patient was admitted with hematochezia and dark blood tinged emesis.  Pt s/p EGD revealed small esophageal and query small proximal gastric varices with mild lower Weiss tear.  Hgb is 9.9.  Potassium is 3.2.   OT comments  Pt making progress towards OT goals. Pt tolerating both seated and standing ADL at sink during this session; requiring minA for standing portion of task due to continued LE weakness. Pt tolerating session well but does fatigue with prolonged activity and requiring seated rest breaks. VSS on RA. Pt performing additional room level mobility using RW with minA. Feel POC remains appropriate at this time. Acute OT goals have been updated to reflect pt's continued progress. Will continue to follow.    Follow Up Recommendations  SNF;Supervision/Assistance - 24 hour    Equipment Recommendations  Other (comment)(defer to next venue)          Precautions / Restrictions Precautions Precautions: Fall Restrictions Weight Bearing Restrictions: No       Mobility Bed Mobility Overal bed mobility: Modified Independent Bed Mobility: Supine to Sit           General bed mobility comments: hand off from PT to begin session  Transfers Overall transfer level: Needs assistance Equipment used: Rolling walker (2 wheeled) Transfers: Sit to/from Stand Sit to Stand: Min guard;Min assist         General transfer comment: stood x2-3 times from chair placed at sink, minguard-minA for safety and boosting assist     Balance Overall balance assessment: Needs assistance Sitting-balance support: Feet supported Sitting balance-Leahy Scale: Fair     Standing balance support: During functional activity;Bilateral  upper extremity supported;Single extremity supported Standing balance-Leahy Scale: Poor Standing balance comment: reliant on at least one UE support for balance                           ADL either performed or assessed with clinical judgement   ADL Overall ADL's : Needs assistance/impaired     Grooming: Wash/dry face;Oral care;Brushing hair;Set up;Sitting;Minimal assistance;Standing Grooming Details (indicate cue type and reason): assist for standing balance as pt with LE weakness, shakiness noted with prolonged standing                             Functional mobility during ADLs: Minimal assistance;Rolling walker General ADL Comments: pt improving with tolerance to activity though continues to present with weakness and fatigue, requires rest breaks                       Cognition Arousal/Alertness: Awake/alert Behavior During Therapy: WFL for tasks assessed/performed Overall Cognitive Status: Within Functional Limits for tasks assessed                                 General Comments: WFL limits for simple tasks, pt is chatty and does require redirection        Exercises     Shoulder Instructions       General Comments      Pertinent Vitals/ Pain       Pain Assessment: Faces Faces Pain  Scale: Hurts little more Pain Location: stomach, back Pain Descriptors / Indicators: Discomfort;Guarding;Sore Pain Intervention(s): Limited activity within patient's tolerance;Monitored during session;Repositioned  Home Living                                          Prior Functioning/Environment              Frequency  Min 2X/week        Progress Toward Goals  OT Goals(current goals can now be found in the care plan section)  Progress towards OT goals: Progressing toward goals  Acute Rehab OT Goals Patient Stated Goal: feel better OT Goal Formulation: With patient Time For Goal Achievement:  11/23/19 Potential to Achieve Goals: Good  Plan Discharge plan remains appropriate    Co-evaluation                 AM-PAC OT "6 Clicks" Daily Activity     Outcome Measure   Help from another person eating meals?: None Help from another person taking care of personal grooming?: A Little Help from another person toileting, which includes using toliet, bedpan, or urinal?: A Little Help from another person bathing (including washing, rinsing, drying)?: A Little Help from another person to put on and taking off regular upper body clothing?: None Help from another person to put on and taking off regular lower body clothing?: A Little 6 Click Score: 20    End of Session Equipment Utilized During Treatment: Gait belt;Rolling walker  OT Visit Diagnosis: Unsteadiness on feet (R26.81);Other abnormalities of gait and mobility (R26.89);History of falling (Z91.81);Muscle weakness (generalized) (M62.81)   Activity Tolerance Patient tolerated treatment well   Patient Left in bed;with call bell/phone within reach   Nurse Communication Mobility status        Time: 9675-9163 OT Time Calculation (min): 14 min  Charges: OT General Charges $OT Visit: 1 Visit OT Treatments $Self Care/Home Management : 8-22 mins  Tom Bender, [place] Pager [phone] Office [phone]    Tom Bender 11/05/2019, 5:06 PM

## 2019-11-06 NOTE — Progress Notes (Signed)
PROGRESS NOTE    Tom Bender  QQP:[id number] DOB: Dec 22, 1960 DOA: 10/22/2019 PCP: Patient, No Pcp Per     Brief Narrative:  Tom Bender is a 59 y.o.malewith medical history significant ofEtOH abuse that is ongoing, cirrhosis and varices, DTs.  Patient presents to the ED with c/o hematochezia.He states that he has been having generalized abdominal pain since having gallbladder surgery last June. For the last 2-3 weeks, he has been having rectal bleeding. Stool is initially red and is now black. He has also had nausea and vomiting without any blood in the emesis. He feels generally weak and has been falling. He continues to consume alcohol and admits to drinking 40 ounces of beer today. He usually drinks 40-80 ounces of beer a day. He denies other drug use. He denies exposure to COVID-19.  On further review, patient was admitted in Oct and again in Nov at [facility] with reports of GIB. EGD in Oct just showed small non-bleeding varicies, no GAVE. Colonoscopy in Aug with polypectomy done after c/o rectal bleeding then as well and bleeding thought to be due to internal hemorrhoids.  In Nov he was admitted for reported bleeding and initial S.Tachycardia. Went into withdrawals the next day. Was hemoccult positive then, though HGB remained stable so they didn't scope him. Was found to have bacterial PNA during that admit.  Patient admitted to [facility] on 10/22/2019 with GI bleed.  He was given FFP, packed red blood cells, IV fluids, as he was noted to be hypotensive.  Patient was admitted to ICU.  Gastroenterology consulted, status post EGD. Continues to have abdominal pain, added GI cocktail with lidocaine.  Attained abdominal ultrasound showing small ascites, status post paracentesis 1/11 yielding 2.3 L. Additional paracentesis 1/18 yielding 2.5 L.   New events last 24 hours / Subjective: Abdomen feels slightly better after paracentesis   Assessment  & Plan:   Principal Problem:   GI bleed Active Problems:   Hyponatremia   Benign essential HTN   Alcohol abuse   Alcoholic hepatitis   Sinus tachycardia   Pressure injury of skin   Mallory-Weiss tear   Protein-calorie malnutrition, severe   Hypovolemic shock -Secondary to hemorrhagic cause, GI losses as well as poor hydration and alcohol use -Resolved  GI bleed, hematemesis, melena, acute blood loss anemia, symptomatic anemia -Transfused 1u FFP, 2uPRBC 10/23/2019 -EGD 10/24/2019 showed small esophageal varices without stigmata.  Small Mallory-Weiss tear without stigmata, which explains coffee-ground emesis.  Possible small proximal gastric varices.  Mild portal gastropathy.  Alcoholic hepatitis.  Chronic alcoholism.  Recommended to stop octreotide, continue PPI.  Patient to follow-up with his primary gastroenterologist in [facility] Dr. Marcelene Butte -Hemoglobin has remained stable   Abdominal pain  -Likely secondary to the above -Continue pain control -Continue sucralfate -Continue GI cocktail with lidocaine, pepcid -Abd [place]: Cirrhotic/steatotic appearance of liver with small volume ascites that is increased from 1/5 study. S/p paracentesis 1/11 yielding 2.3 L  -S/p paracentesis 1/18 yielding 2.5 L   Hepatic cirrhosis with ascites secondary to alcohol use disorder -Continue lasix and spironolactone   Thrombocytopenia secondary to liver disease -Resolved   Deconditioning -PT and OT recommended SNF  Zinc and vitamin C deficiency  -Appreciate dietitian, replace p.o.  Dental caries -Patient will need outpatient dental follow-up     DVT prophylaxis: SCD Code Status: Full  Family Communication: No family at bedside Disposition Plan: SNF placement pending, noted no bed offers so far.   Consultants:   PCCM  GI   Antimicrobials:  Anti-infectives (From admission, onward)   Start     Dose/Rate Route Frequency Ordered Stop   10/23/19 0500  cefTRIAXone (ROCEPHIN) 2  g in sodium chloride 0.9 % 100 mL IVPB  Status:  Discontinued     2 g 200 mL/hr over 30 Minutes Intravenous Every 24 hours 10/23/19 0453 10/31/19 1121       Objective: Vitals:   11/05/19 1702 11/05/19 2107 11/06/19 0250 11/06/19 0822  BP: 105/76 (!) 113/94 108/74 99/75  Pulse: 86 82 82 92  Resp: 16  20 18   Temp: 98.1 F (36.7 C) 98.2 F (36.8 C) 98.7 F (37.1 C) 98.2 F (36.8 C)  TempSrc: Oral Oral Oral Oral  SpO2: 100% 99% 99% 99%  Weight:      Height:        Intake/Output Summary (Last 24 hours) at 11/06/2019 1038 Last data filed at 11/06/2019 0828 Gross per 24 hour  Intake 490 ml  Output 2575 ml  Net -2085 ml   Filed Weights   10/22/19 1837 10/24/19 0400 10/24/19 0941  Weight: 72.6 kg 72.7 kg 72.7 kg    Examination: General exam: Appears calm and comfortable  Respiratory system: Clear to auscultation. Respiratory effort normal. Cardiovascular system: S1 & S2 heard, RRR. No pedal edema. Gastrointestinal system: Abdomen is nondistended, soft and nontender. Normal bowel sounds heard. Central nervous system: Alert and oriented. Non focal exam. Speech clear  Extremities: Symmetric in appearance bilaterally  Skin: No rashes, lesions or ulcers on exposed skin  Psychiatry: Judgement and insight appear stable. Mood & affect appropriate.    Data Reviewed: I have personally reviewed following labs and imaging studies  CBC: Recent Labs  Lab 10/31/19 0312 11/01/19 0228 11/02/19 0429 11/03/19 0259 11/05/19 0232  WBC 5.4 6.2 6.7 7.4 6.6  HGB 10.0* 9.8* 9.6* 9.9* 9.8*  HCT 30.5* 29.6* 29.3* 30.7* 29.8*  MCV 103.0* 103.1* 102.8* 104.4* 104.2*  PLT 123* 136* 133* 147* 035   Basic Metabolic Panel: Recent Labs  Lab 10/31/19 0312 11/01/19 0228 11/02/19 0429 11/03/19 0259 11/05/19 0232  NA 135 134* 136 135 138  K 3.9 3.9 3.3* 3.9 3.9  CL 102 101 99 101 106  CO2 29 27 29 27 26   GLUCOSE 96 89 92 94 89  BUN 6 7 5* 5* <5*  CREATININE 0.56* 0.49* 0.55* 0.57* 0.55*    CALCIUM 8.0* 7.8* 7.9* 8.0* 8.0*  MG 1.7  --   --   --   --    GFR: Estimated Creatinine Clearance: 103.1 mL/min (A) (by C-G formula based on SCr of 0.55 mg/dL (L)). Liver Function Tests: Recent Labs  Lab 10/31/19 0312 11/02/19 0429  AST 85* 69*  ALT 40 32  ALKPHOS 144* 136*  BILITOT 2.7* 2.3*  PROT 5.3* 5.4*  ALBUMIN 1.7* 1.7*   No results for input(s): LIPASE, AMYLASE in the last 168 hours. No results for input(s): AMMONIA in the last 168 hours. Coagulation Profile: No results for input(s): INR, PROTIME in the last 168 hours. Cardiac Enzymes: No results for input(s): CKTOTAL, CKMB, CKMBINDEX, TROPONINI in the last 168 hours. BNP (last 3 results) No results for input(s): PROBNP in the last 8760 hours. HbA1C: No results for input(s): HGBA1C in the last 72 hours. CBG: No results for input(s): GLUCAP in the last 168 hours. Lipid Profile: No results for input(s): CHOL, HDL, LDLCALC, TRIG, CHOLHDL, LDLDIRECT in the last 72 hours. Thyroid Function Tests: No results for input(s): TSH, T4TOTAL, FREET4, T3FREE, THYROIDAB  in the last 72 hours. Anemia Panel: No results for input(s): VITAMINB12, FOLATE, FERRITIN, TIBC, IRON, RETICCTPCT in the last 72 hours. Sepsis Labs: No results for input(s): PROCALCITON, LATICACIDVEN in the last 168 hours.  No results found for this or any previous visit (from the past 240 hour(s)).    Radiology Studies: IR Paracentesis  Result Date: 11/05/2019 INDICATION: Patient with history of alcoholic cirrhosis, recurrent ascites. Request to IR for therapeutic paracentesis. EXAM: ULTRASOUND GUIDED THERAPEUTIC PARACENTESIS MEDICATIONS: 10 mL 1% lidocaine COMPLICATIONS: None immediate. PROCEDURE: Informed written consent was obtained from the patient after a discussion of the risks, benefits and alternatives to treatment. A timeout was performed prior to the initiation of the procedure. Initial ultrasound scanning demonstrates a moderate amount of ascites  within the left lower abdominal quadrant. The left lower abdomen was prepped and draped in the usual sterile fashion. 1% lidocaine was used for local anesthesia. Following this, a 19 gauge, 7-cm, Yueh catheter was introduced. An ultrasound image was saved for documentation purposes. The paracentesis was performed. The catheter was removed and a dressing was applied. The patient tolerated the procedure well without immediate post procedural complication. FINDINGS: A total of approximately 2.5 L of hazy yellow fluid was removed. IMPRESSION: Successful ultrasound-guided paracentesis yielding 2.5 liters of peritoneal fluid. Read by Lynnette Caffey, PA-C Electronically Signed   By: Richarda Overlie M.D.   On: 11/05/2019 11:20      Scheduled Meds: . vitamin C  500 mg Oral BID  . Chlorhexidine Gluconate Cloth  6 each Topical Daily  . famotidine  20 mg Oral BID  . feeding supplement (ENSURE ENLIVE)  237 mL Oral TID BM  . folic acid  1 mg Oral Daily  . furosemide  20 mg Oral Daily  . Gerhardt's butt cream   Topical TID  . mouth rinse  15 mL Mouth Rinse BID  . multivitamin with minerals  1 tablet Oral Daily  . pantoprazole  40 mg Oral BID AC  . spironolactone  50 mg Oral Daily  . sucralfate  1 g Oral TID WC & HS  . thiamine  100 mg Oral Daily  . zinc sulfate  220 mg Oral Daily   Continuous Infusions:    LOS: 14 days      Time spent: 25 minutes   Noralee Stain, DO [facility] 11/06/2019, 10:38 AM   Available via Epic secure chat 7am-7pm After these hours, please refer to coverage provider listed on [email or web address]

## 2019-11-06 NOTE — Progress Notes (Signed)
Nutrition Follow-up  DOCUMENTATION CODES:   Severe malnutrition in context of chronic illness  INTERVENTION:   -Continue 500 mg vitamin C BID -Continue 220 mg zinc sulfate daily -Continue MVI with minerals daily -Continue Magic cup BID with meals, each supplement provides 290 kcal and 9 grams of protein -Continue Ensure Enlive po TID, each supplement provides 350 kcal and 20 grams of protein -Downgrade diet to dysphagia 3 (advanced mechanical soft) for ease of intake   NUTRITION DIAGNOSIS:   Severe Malnutrition related to chronic illness(cirrhosis) as evidenced by severe fat depletion, severe muscle depletion.  Ongoing  GOAL:   Patient will meet greater than or equal to 90% of their needs  Progressing   MONITOR:   PO intake, Supplement acceptance, Labs  REASON FOR ASSESSMENT:   Malnutrition Screening Tool    ASSESSMENT:   Pt with PMH of ongoing ETOH abuse (40-80 oz beer daily), liver cirrhosis with varices and DVTs who lives with sister per review pt has had 2-3 weeks of weakness, falls, and rectal bleeding.  1/7 EGD reveals small esophageal and small proximal gastric varices with tear 1/19- s/p lt paracentesis (2.5 L hazy yellow fluid removed)  Reviewed I/O's: -3.1 L x 24 hours and +1.6 L since admission  UOP: 3.6 L x 24 hours  Spoke with pt at bedside, who reports feeling "miserable" today. He complains of back pain and abdominal distention. Pt reports fair appetite, but has difficulty with chewing meat due to teeth pain and lack of teeth. Intake has been variable; PO's documented at 25-100%.   Pt estimates that he consumes one full Ensure supplement daily. He complains of early satiety relating bloating and pain. Discussed importance of small, frequent meals to assist with early satiety. Pt also amenable to downgrade diet to assist with ease of intake.   Labs reviewed.   Diet Order:   Diet Order            DIET DYS 3 Room service appropriate? Yes with  Assist; Fluid consistency: Thin  Diet effective now              EDUCATION NEEDS:   Education needs have been addressed  Skin:  Skin Assessment: Skin Integrity Issues: Skin Integrity Issues:: Stage I, Other (Comment)(MASD: perineum, buttocks) Stage I: L Ischial tuberosity Other: Open wound R/L buttocks  Last BM:  11/05/19  Height:   Ht Readings from Last 1 Encounters:  10/24/19 5\' 9"  (1.753 m)    Weight:   Wt Readings from Last 1 Encounters:  10/24/19 72.7 kg    Ideal Body Weight:  72.7 kg  BMI:  Body mass index is 23.67 kg/m.  Estimated Nutritional Needs:   Kcal:  2200-2400  Protein:  100-125 grams  Fluid:  2 L/day    Rick Warnick A. 12/22/19, RD, LDN, CDCES Registered Dietitian II Certified Diabetes Care and Education Specialist Pager: [phone] After hours Pager: [phone]

## 2019-11-06 NOTE — Plan of Care (Signed)
°  Problem: Coping: °Goal: Level of anxiety will decrease °Outcome: Progressing °  °

## 2019-11-07 DIAGNOSIS — R571 Hypovolemic shock: Secondary | ICD-10-CM

## 2019-11-07 DIAGNOSIS — K7030 Alcoholic cirrhosis of liver without ascites: Secondary | ICD-10-CM

## 2019-11-07 LAB — CBC
HCT: 30.1 % — ABNORMAL LOW (ref 39.0–52.0)
Hemoglobin: 9.6 g/dL — ABNORMAL LOW (ref 13.0–17.0)
MCH: 33.9 pg (ref 26.0–34.0)
MCHC: 31.9 g/dL (ref 30.0–36.0)
MCV: 106.4 fL — ABNORMAL HIGH (ref 80.0–100.0)
Platelets: 161 10*3/uL (ref 150–400)
RBC: 2.83 MIL/uL — ABNORMAL LOW (ref 4.22–5.81)
RDW: 19.9 % — ABNORMAL HIGH (ref 11.5–15.5)
WBC: 6.4 10*3/uL (ref 4.0–10.5)
nRBC: 0 % (ref 0.0–0.2)

## 2019-11-07 LAB — BASIC METABOLIC PANEL
Anion gap: 5 (ref 5–15)
BUN: 6 mg/dL (ref 6–20)
CO2: 25 mmol/L (ref 22–32)
Calcium: 7.7 mg/dL — ABNORMAL LOW (ref 8.9–10.3)
Chloride: 105 mmol/L (ref 98–111)
Creatinine, Ser: 0.65 mg/dL (ref 0.61–1.24)
GFR calc Af Amer: >60 mL/min (ref >60)
GFR calc non Af Amer: >60 mL/min (ref >60)
Glucose, Bld: 98 mg/dL (ref 70–99)
Potassium: 3.5 mmol/L (ref 3.5–5.1)
Sodium: 135 mmol/L (ref 135–145)

## 2019-11-07 MED ORDER — FUROSEMIDE 20 MG PO TABS
20.0000 mg | ORAL_TABLET | Freq: Every day | ORAL | Status: AC
  Filled 2019-11-07: qty 1

## 2019-11-07 NOTE — Progress Notes (Signed)
Occupational Therapy Treatment Patient Details Name: Tom Bender MRN: [id number] DOB: Dec 27, 1960 Today's Date: 11/07/2019    History of present illness Pt is 59 year old male with PMH significant for alcohol abuse that is continuous, liver cirrhosis with varices and DVTs.  Patient was admitted with hematochezia and dark blood tinged emesis.  Pt s/p EGD revealed small esophageal and query small proximal gastric varices with mild lower Weiss tear.  Hgb is 9.9.  Potassium is 3.2.   OT comments  Pt making steady progress towards OT goals this session. Session focus on functional mobility to increase activity tolerance for BADL participation. Pt handed off from PT with pt reporting dizziness (BP 114/81 HR 117 O2 on RA 88%). Overall, pt requires min - guard - supervision for functional transfer with RW needing x2 seated rest breaks. Pt limited by pain, decreased activity tolerance and generalized weakness. DC plan remains appropriate, will follow acutely per POC.    Follow Up Recommendations  SNF;Supervision/Assistance - 24 hour    Equipment Recommendations  Other (comment)(defer to next venue of care)    Recommendations for Other Services      Precautions / Restrictions Precautions Precautions: Fall Precaution Comments: monitor BP and O2 Restrictions Weight Bearing Restrictions: No       Mobility Bed Mobility Overal bed mobility: Modified Independent Bed Mobility: Sit to Supine       Sit to supine: Modified independent (Device/Increase time)   General bed mobility comments: no physical assist needed; use of bed rails with HOB elevated  Transfers Overall transfer level: Needs assistance Equipment used: Rolling walker (2 wheeled) Transfers: Sit to/from Stand Sit to Stand: Supervision         General transfer comment: pt completed x5 sit>stands from EOB to increase activity tolerancer with supervision; good technique noted    Balance Overall balance assessment: Needs  assistance Sitting-balance support: Feet supported Sitting balance-Leahy Scale: Fair                                     ADL either performed or assessed with clinical judgement   ADL Overall ADL's : Needs assistance/impaired                         Toilet Transfer: Consulting civil engineer Details (indicate cue type and reason): simulated toilet transfer via fucntional mobility with RW         Functional mobility during ADLs: Min guard;Supervision/safety;Rolling walker General ADL Comments: session focus on functional mobility via sit>stands to increase activity tolerance for higher level ADL participation. Pt continues to require seated rest breaks decreased activity tolerance and generalized weakness     Vision Baseline Vision/History: Wears glasses Wears Glasses: Reading only     Perception     Praxis      Cognition Arousal/Alertness: Awake/alert Behavior During Therapy: WFL for tasks assessed/performed Overall Cognitive Status: Within Functional Limits for tasks assessed                                 General Comments: WFL for simple tasks. Pt expressed concerns about current living situation as well as his desire to quit drinking and get his health on the right track. Offered emotion support and encouragement.         Exercises     Shoulder Instructions  General Comments Pt on RA throughout session with O2 desating slightly to 88% but quickly rebounding with seated rest break.  Pt continues to report feeling dizzy with mobility BP 114/81 HR 117. HR increase to as much as 130 bpm after functional mobility    Pertinent Vitals/ Pain       Pain Assessment: Faces Faces Pain Scale: Hurts little more Pain Location: stomach, back Pain Descriptors / Indicators: Discomfort;Guarding;Sore Pain Intervention(s): Monitored during session;Repositioned  Home Living                                           Prior Functioning/Environment              Frequency  Min 2X/week        Progress Toward Goals  OT Goals(current goals can now be found in the care plan section)  Progress towards OT goals: Progressing toward goals  Acute Rehab OT Goals Patient Stated Goal: feel better OT Goal Formulation: With patient Time For Goal Achievement: 11/23/19 Potential to Achieve Goals: Good  Plan Discharge plan remains appropriate    Co-evaluation                 AM-PAC OT "6 Clicks" Daily Activity     Outcome Measure   Help from another person eating meals?: None Help from another person taking care of personal grooming?: A Little Help from another person toileting, which includes using toliet, bedpan, or urinal?: A Little Help from another person bathing (including washing, rinsing, drying)?: A Little Help from another person to put on and taking off regular upper body clothing?: None Help from another person to put on and taking off regular lower body clothing?: A Little 6 Click Score: 20    End of Session Equipment Utilized During Treatment: Gait belt;Rolling walker  OT Visit Diagnosis: Unsteadiness on feet (R26.81);Other abnormalities of gait and mobility (R26.89);History of falling (Z91.81);Muscle weakness (generalized) (M62.81)   Activity Tolerance Patient tolerated treatment well   Patient Left in bed;with call bell/phone within reach   Nurse Communication          Time: 1610-9604 OT Time Calculation (min): 27 min  Charges: OT General Charges $OT Visit: 1 Visit OT Treatments $Therapeutic Activity: 23-37 mins  Audery Amel., COTA/L Acute Rehabilitation Services [phone]    Angelina Pih 11/07/2019, 4:09 PM

## 2019-11-07 NOTE — Plan of Care (Signed)
  Problem: Safety: Goal: Ability to remain free from injury will improve Outcome: Progressing   

## 2019-11-07 NOTE — Progress Notes (Signed)
Patient complaining of increased SOB , abdomen more distended than last night. Notified on call physician orders to be written to give lasix.

## 2019-11-07 NOTE — Progress Notes (Signed)
PROGRESS NOTE  Tom Bender [id number] DOB: 02/17/1961 DOA: 10/22/2019 PCP: Patient, No Pcp Per  Brief Narrative:  Tom Bender is a 59 y.o.malewith medical history significant ofEtOH abuse that is ongoing, cirrhosis and varices, DTs.  Patient presents to the ED with c/o hematochezia.He states that he has been having generalized abdominal pain since having gallbladder surgery last June. For the last 2-3 weeks, he has been having rectal bleeding. Stool is initially red and is now black. He has also had nausea and vomiting without any blood in the emesis. He feels generally weak and has been falling. He continues to consume alcohol and admits to drinking 40 ounces of beer today. He usually drinks 40-80 ounces of beer a day. He denies other drug use. He denies exposure to COVID-19.  On further review, patient was admitted in Oct and again in Nov at [facility] with reports of GIB. EGD in Oct just showed small non-bleeding varicies, no GAVE. Colonoscopy in Aug with polypectomy done after c/o rectal bleeding then as well and bleeding thought to be due to internal hemorrhoids.  In Nov he was admitted for reported bleeding and initial S.Tachycardia. Went into withdrawals the next day. Was hemoccult positive then, though HGB remained stable so they didn't scope him. Was found to have bacterial PNA during that admit.  Patient admitted to [facility] on 10/22/2019 with GI bleed. He was given FFP, packed red blood cells, IV fluids, as he was noted to be hypotensive. Patient was admitted to ICU. Gastroenterology consulted, status post EGD. Continues to have abdominal pain, added GI cocktail with lidocaine.Attained abdominal ultrasound showing small ascites, status post paracentesis 1/11 yielding 2.3L. Additional paracentesis 1/18 yielding 2.5 L.     HPI/Recap of past 24 hours:  Feeling better, but abdomen seems to enlarging, denies active bleeding Leg  edema has improved  Assessment/Plan: Principal Problem:   GI bleed Active Problems:   Hyponatremia   Benign essential HTN   Alcohol abuse   Alcoholic hepatitis   Sinus tachycardia   Pressure injury of skin   Mallory-Weiss tear   Protein-calorie malnutrition, severe   Hypovolemic shock -Secondary to hemorrhagic cause, GI losses as well as poor hydration and alcohol use -Resolved  GI bleed, hematemesis, melena, acute blood loss anemia, symptomatic anemia -Transfused 1u FFP, 2uPRBC 10/23/2019 -EGD 10/24/2019 showed small esophageal varices without stigmata. Small Mallory-Weiss tear without stigmata, which explains coffee-ground emesis. Possible small proximal gastric varices. Mild portal gastropathy. Alcoholic hepatitis. Chronic alcoholism. Recommended to stop octreotide, continue PPI. Patient to follow-up with his primary gastroenterologist in [facility] Dr. Alonza Bogus -Hemoglobin has remained stable   Abdominal pain  -Likely secondary to the above -Continue pain control -Continue sucralfate -Continue GI cocktail with lidocaine, pepcid -Abd [place]: Cirrhotic/steatotic appearance of liver with small volume ascites that is increased from 1/5 study. S/pparacentesis 1/11 yielding 2.3 L  -S/p paracentesis 1/18 yielding 2.5 L   Hepatic cirrhosiswith ascites/small esophageal and gastric vacices/chronic coagulopathysecondary to alcohol use disorder -Continue lasix and spironolactone  Thrombocytopenia secondary to liver disease -Resolved   Deconditioning -PT and OT recommended SNF  Zinc and vitamin C deficiency  -Appreciate dietitian, replace p.o.  Dental caries -Patient will need outpatient dental follow-up     DVT prophylaxis: SCD Code Status: Full  Family Communication: No family at bedside Disposition Plan: SNF placement pending, noted no bed offers so far.   Consultants:   PCCM  GI  Wound care   Procedures:  EGD on  1/6  Antibiotics:  none   Objective: BP 104/71 (BP Location: Right Arm)   Pulse 83   Temp 98.6 F (37 C) (Oral)   Resp 18   Ht 5\' 9"  (1.753 m)   Wt 72.7 kg   SpO2 93%   BMI 23.67 kg/m   Intake/Output Summary (Last 24 hours) at 11/07/2019 1308 Last data filed at 11/07/2019 1100 Gross per 24 hour  Intake 600 ml  Output 1400 ml  Net -800 ml   Filed Weights   10/22/19 1837 10/24/19 0400 10/24/19 0941  Weight: 72.6 kg 72.7 kg 72.7 kg    Exam: Patient is examined daily including today on 11/07/2019, exams remain the same as of yesterday except that has changed    General:  Chronically ill appearing, NAD, mild intermittent tremor   Cardiovascular: RRR  Respiratory: CTABL  Abdomen: Distended , nontender, positive BS  Musculoskeletal: Trace bilateral lower extremity pitting edema  Neuro: alert, oriented   Data Reviewed: Basic Metabolic Panel: Recent Labs  Lab 11/01/19 0228 11/02/19 0429 11/03/19 0259 11/05/19 0232 11/07/19 0236  NA 134* 136 135 138 135  K 3.9 3.3* 3.9 3.9 3.5  CL 101 99 101 106 105  CO2 27 29 27 26 25   GLUCOSE 89 92 94 89 98  BUN 7 5* 5* <5* 6  CREATININE 0.49* 0.55* 0.57* 0.55* 0.65  CALCIUM 7.8* 7.9* 8.0* 8.0* 7.7*   Liver Function Tests: Recent Labs  Lab 11/02/19 0429  AST 69*  ALT 32  ALKPHOS 136*  BILITOT 2.3*  PROT 5.4*  ALBUMIN 1.7*   No results for input(s): LIPASE, AMYLASE in the last 168 hours. No results for input(s): AMMONIA in the last 168 hours. CBC: Recent Labs  Lab 11/01/19 0228 11/02/19 0429 11/03/19 0259 11/05/19 0232 11/07/19 0236  WBC 6.2 6.7 7.4 6.6 6.4  HGB 9.8* 9.6* 9.9* 9.8* 9.6*  HCT 29.6* 29.3* 30.7* 29.8* 30.1*  MCV 103.1* 102.8* 104.4* 104.2* 106.4*  PLT 136* 133* 147* 162 161   Cardiac Enzymes:   No results for input(s): CKTOTAL, CKMB, CKMBINDEX, TROPONINI in the last 168 hours. BNP (last 3 results) No results for input(s): BNP in the last 8760 hours.  ProBNP (last 3 results) No results  for input(s): PROBNP in the last 8760 hours.  CBG: No results for input(s): GLUCAP in the last 168 hours.  No results found for this or any previous visit (from the past 240 hour(s)).   Studies: No results found.  Scheduled Meds: . vitamin C  500 mg Oral BID  . Chlorhexidine Gluconate Cloth  6 each Topical Daily  . famotidine  20 mg Oral BID  . feeding supplement (ENSURE ENLIVE)  237 mL Oral TID BM  . folic acid  1 mg Oral Daily  . furosemide  20 mg Oral Daily  . Gerhardt's butt cream   Topical TID  . mouth rinse  15 mL Mouth Rinse BID  . multivitamin with minerals  1 tablet Oral Daily  . pantoprazole  40 mg Oral BID AC  . spironolactone  50 mg Oral Daily  . sucralfate  1 g Oral TID WC & HS  . thiamine  100 mg Oral Daily  . zinc sulfate  220 mg Oral Daily    Continuous Infusions:   Time spent: 11/07/19 I have personally reviewed and interpreted on  11/07/2019 daily labs, tele strips, imagings as discussed above under date review session and assessment and plans.  I reviewed all nursing notes, pharmacy notes, consultant notes,  vitals, pertinent old records  I have discussed plan of care as described above with RN , patient  on 11/07/2019   Albertine Grates MD, PhD, FACP  [facility]  Available via Epic secure chat 7am-7pm for nonurgent issues Please page for urgent issues, pager number available through [email or web address] .   11/07/2019, 1:08 PM  LOS: 15 days

## 2019-11-07 NOTE — Progress Notes (Signed)
Physical Therapy Treatment Patient Details Name: Tom Bender MRN: [id number] DOB: [birth year] Today's Date: 11/07/2019    History of Present Illness Pt is 59 year old male with PMH significant for alcohol abuse that is continuous, liver cirrhosis with varices and DVTs.  Patient was admitted with hematochezia and dark blood tinged emesis.  Pt s/p EGD revealed small esophageal and query small proximal gastric varices with mild lower Weiss tear.     PT Comments    Pt remains very limited with mobility secondary to generalized weakness and fatigue. Pt very tremulous throughout with all activity. OT present at end of session with pt seated EOB. Continue to recommend post-acute rehab at a SNF prior to returning home. Pt would continue to benefit from skilled physical therapy services at this time while admitted and after d/c to address the below listed limitations in order to improve overall safety and independence with functional mobility.    Follow Up Recommendations  SNF     Equipment Recommendations  Other (comment)(defer to next venue of care)    Recommendations for Other Services       Precautions / Restrictions Precautions Precautions: Fall Precaution Comments: monitor BP and O2 Restrictions Weight Bearing Restrictions: No    Mobility  Bed Mobility Overal bed mobility: Modified Independent Bed Mobility: Sit to Supine       Sit to supine: Modified independent (Device/Increase time)   General bed mobility comments: no physical assist needed; use of bed rails with HOB elevated  Transfers Overall transfer level: Needs assistance Equipment used: Rolling walker (2 wheeled) Transfers: Sit to/from Stand Sit to Stand: Min guard         General transfer comment: min guard for safety; good technique utilized  Ambulation/Gait Ambulation/Gait assistance: Min assist;Min guard Gait Distance (Feet): 150 Feet Assistive device: Rolling walker (2 wheeled) Gait  Pattern/deviations: Step-through pattern;Decreased stride length;Trunk flexed;Ataxic Gait velocity: decreased   General Gait Details: pt very unsteady throughout, requiring constant close min guard and frequent min A for stability. Pt also limited secondary to fatigue. Pt on RA throughout with SPO2 fluctuating at 89-93%.   Stairs             Wheelchair Mobility    Modified Rankin (Stroke Patients Only)       Balance Overall balance assessment: Needs assistance Sitting-balance support: Feet supported Sitting balance-Leahy Scale: Fair     Standing balance support: During functional activity;Bilateral upper extremity supported;Single extremity supported Standing balance-Leahy Scale: Poor                              Cognition Arousal/Alertness: Awake/alert Behavior During Therapy: WFL for tasks assessed/performed Overall Cognitive Status: Within Functional Limits for tasks assessed                                 General Comments: WFL for simple tasks.      Exercises      General Comments General comments (skin integrity, edema, etc.): Pt on RA throughout session with O2 desating slightly to 88% but quickly rebounding with seated rest break.  Pt continues to report feeling dizzy with mobility BP 114/81 HR 117. HR increase to as much as 130 bpm after functional mobility      Pertinent Vitals/Pain Pain Assessment: Faces Faces Pain Scale: Hurts even more Pain Location: stomach, back Pain Descriptors / Indicators: Discomfort;Guarding;Sore Pain Intervention(s): Monitored during session;Repositioned  Home Living                      Prior Function            PT Goals (current goals can now be found in the care plan section) Acute Rehab PT Goals Patient Stated Goal: feel better PT Goal Formulation: With patient Time For Goal Achievement: 11/21/19 Potential to Achieve Goals: Fair Progress towards PT goals: Progressing toward  goals    Frequency    Min 2X/week      PT Plan Current plan remains appropriate    Co-evaluation              AM-PAC PT "6 Clicks" Mobility   Outcome Measure  Help needed turning from your back to your side while in a flat bed without using bedrails?: None Help needed moving from lying on your back to sitting on the side of a flat bed without using bedrails?: None Help needed moving to and from a bed to a chair (including a wheelchair)?: A Little Help needed standing up from a chair using your arms (e.g., wheelchair or bedside chair)?: A Little Help needed to walk in hospital room?: A Little Help needed climbing 3-5 steps with a railing? : A Lot 6 Click Score: 19    End of Session Equipment Utilized During Treatment: Gait belt Activity Tolerance: Patient limited by fatigue Patient left: in bed;with call bell/phone within reach;Other (comment)(seated EOB with OT present ) Nurse Communication: Mobility status PT Visit Diagnosis: Unsteadiness on feet (R26.81);Muscle weakness (generalized) (M62.81)     Time: 7793-9030 PT Time Calculation (min) (ACUTE ONLY): 20 min  Charges:  $Gait Training: 8-22 mins                     Arletta Bale, DPT  Acute Rehabilitation Services Pager [phone] Office [phone]     Alessandra Bevels Herny Scurlock 11/07/2019, 5:06 PM

## 2019-11-08 ENCOUNTER — Inpatient Hospital Stay (HOSPITAL_COMMUNITY): Payer: Self-pay

## 2019-11-08 DIAGNOSIS — E43 Unspecified severe protein-calorie malnutrition: Secondary | ICD-10-CM

## 2019-11-08 LAB — COMPREHENSIVE METABOLIC PANEL
ALT: 23 U/L (ref 0–44)
AST: 70 U/L — ABNORMAL HIGH (ref 15–41)
Albumin: 1.8 g/dL — ABNORMAL LOW (ref 3.5–5.0)
Alkaline Phosphatase: 115 U/L (ref 38–126)
Anion gap: 7 (ref 5–15)
BUN: 6 mg/dL (ref 6–20)
CO2: 26 mmol/L (ref 22–32)
Calcium: 8.1 mg/dL — ABNORMAL LOW (ref 8.9–10.3)
Chloride: 105 mmol/L (ref 98–111)
Creatinine, Ser: 0.63 mg/dL (ref 0.61–1.24)
GFR calc Af Amer: >60 mL/min (ref >60)
GFR calc non Af Amer: >60 mL/min (ref >60)
Glucose, Bld: 106 mg/dL — ABNORMAL HIGH (ref 70–99)
Potassium: 3.6 mmol/L (ref 3.5–5.1)
Sodium: 138 mmol/L (ref 135–145)
Total Bilirubin: 1.2 mg/dL (ref 0.3–1.2)
Total Protein: 5.9 g/dL — ABNORMAL LOW (ref 6.5–8.1)

## 2019-11-08 LAB — MAGNESIUM: Magnesium: 1.5 mg/dL — ABNORMAL LOW (ref 1.7–2.4)

## 2019-11-08 LAB — TSH: TSH: 2.591 u[IU]/mL (ref 0.350–4.500)

## 2019-11-08 MED ORDER — LACTULOSE 10 GM/15ML PO SOLN
10.0000 g | Freq: Three times a day (TID) | ORAL | Status: DC
  Administered 2019-11-08 – 2019-11-15 (×21): 10 g via ORAL
  Filled 2019-11-08 (×21): qty 15

## 2019-11-08 MED ORDER — BISACODYL 10 MG RE SUPP: 10.0000 mg | Freq: Every day | RECTAL | Status: AC

## 2019-11-08 MED ORDER — POTASSIUM CHLORIDE CRYS ER 20 MEQ PO TBCR
40.0000 meq | EXTENDED_RELEASE_TABLET | Freq: Once | ORAL | Status: AC
  Administered 2019-11-08: 15:00:00 40 meq via ORAL
  Filled 2019-11-08: qty 2

## 2019-11-08 MED ORDER — ALBUMIN HUMAN 25 % IV SOLN
12.5000 g | Freq: Once | INTRAVENOUS | Status: AC
  Administered 2019-11-08: 12.5 g via INTRAVENOUS
  Filled 2019-11-08: qty 50

## 2019-11-08 MED ORDER — MAGNESIUM OXIDE 400 (241.3 MG) MG PO TABS
400.0000 mg | ORAL_TABLET | Freq: Two times a day (BID) | ORAL | Status: DC
  Administered 2019-11-08 – 2019-11-15 (×15): 400 mg via ORAL
  Filled 2019-11-08 (×15): qty 1

## 2019-11-08 MED ORDER — LIDOCAINE HCL 1 % IJ SOLN
INTRAMUSCULAR | Status: AC
  Filled 2019-11-08: qty 20

## 2019-11-08 NOTE — Plan of Care (Signed)
  Problem: Education: Goal: Knowledge of General Education information will improve Description: Including pain rating scale, medication(s)/side effects and non-pharmacologic comfort measures Outcome: Progressing   Problem: Health Behavior/Discharge Planning: Goal: Ability to manage health-related needs will improve Outcome: Progressing   Problem: Clinical Measurements: Goal: Will remain free from infection Outcome: Progressing Goal: Cardiovascular complication will be avoided Outcome: Progressing   Problem: Activity: Goal: Risk for activity intolerance will decrease Outcome: Progressing   Problem: Coping: Goal: Level of anxiety will decrease Outcome: Progressing   Problem: Pain Managment: Goal: General experience of comfort will improve Outcome: Progressing   Problem: Safety: Goal: Ability to remain free from injury will improve Outcome: Progressing   Problem: Skin Integrity: Goal: Risk for impaired skin integrity will decrease Outcome: Progressing

## 2019-11-08 NOTE — Progress Notes (Signed)
Patient brought to radiology for possible paracentesis.  States his abdomen is distended and he has been passing more gas.   Limited US Abdomen performed.  Patient has a trace amount of reaccumulated fluid not amenable to paracentesis at this time.   No procedure performed.  Patient returned to unit.   Loyce Dys, MS RD PA-C 9:07 AM

## 2019-11-08 NOTE — Progress Notes (Addendum)
PROGRESS NOTE  Tom Bender IRC:[id number] DOB: 16-Aug-1961 DOA: 10/22/2019 PCP: Patient, No Pcp Per  Brief Narrative:  Tom Bender is a 59 y.o.malewith medical history significant ofEtOH abuse that is ongoing, cirrhosis and varices, DTs.  Patient presents to the ED with c/o hematochezia.He states that he has been having generalized abdominal pain since having gallbladder surgery last June. For the last 2-3 weeks, he has been having rectal bleeding. Stool is initially red and is now black. He has also had nausea and vomiting without any blood in the emesis. He feels generally weak and has been falling. He continues to consume alcohol and admits to drinking 40 ounces of beer today. He usually drinks 40-80 ounces of beer a day. He denies other drug use. He denies exposure to COVID-19.  On further review, patient was admitted in Oct and again in Nov at [facility] with reports of GIB. EGD in Oct just showed small non-bleeding varicies, no GAVE. Colonoscopy in Aug with polypectomy done after c/o rectal bleeding then as well and bleeding thought to be due to internal hemorrhoids.  In Nov he was admitted for reported bleeding and initial S.Tachycardia. Went into withdrawals the next day. Was hemoccult positive then, though HGB remained stable so they didn't scope him. Was found to have bacterial PNA during that admit.  Patient admitted to [facility] on 10/22/2019 with GI bleed. He was given FFP, packed red blood cells, IV fluids, as he was noted to be hypotensive. Patient was admitted to ICU. Gastroenterology consulted, status post EGD. Continues to have abdominal pain, added GI cocktail with lidocaine.Attained abdominal ultrasound showing small ascites, status post paracentesis 1/11 yielding 2.3L. Additional paracentesis 1/18 yielding 2.5 L.     HPI/Recap of past 24 hours:  He reports abdomen is more distended with increased short of breath, no  vomiting, he is having bowel movement and stool is brown, he denies blood in stool. Leg edema has much improved No hypoxia  Assessment/Plan: Principal Problem:   GI bleed Active Problems:   Hyponatremia   Benign essential HTN   Alcohol abuse   Alcoholic hepatitis   Sinus tachycardia   Pressure injury of skin   Mallory-Weiss tear   Protein-calorie malnutrition, severe   Hypovolemic shock -Secondary to hemorrhagic cause, GI losses as well as poor hydration and alcohol use -Resolved  GI bleed, hematemesis, melena, acute blood loss anemia, symptomatic anemia -Transfused 1u FFP, 2uPRBC 10/23/2019 -EGD 10/24/2019 showed small esophageal varices without stigmata. Small Mallory-Weiss tear without stigmata, which explains coffee-ground emesis. Possible small proximal gastric varices. Mild portal gastropathy. Alcoholic hepatitis. Chronic alcoholism. Recommended to stop octreotide, continue PPI. Patient to follow-up with his primary gastroenterologist in [facility] Dr. Marcelene Butte -Hemoglobin has remained stable , currently on ppi and carafate  Abdominal pain /distention -Likely secondary to the above --Abd [place]: Cirrhotic/steatotic appearance of liver with small volume ascites that is increased from 1/5 study. S/pparacentesis 1/11 yielding 2.3 L  -S/p paracentesis 1/18 yielding 2.5 L  -Attempt for another therapeutic paracentesis on 1/21 , however Limited US Abdomen performed.  Patient has a trace amount of reaccumulated fluid not amenable to paracentesis at this time.   -Will obtain KUB  Addendum: kub showed "Nonobstructive bowel gas pattern with mild fecal retention throughout the colon" Restart home medication lactulose 10 g by mouth 3 times daily  Hepatic cirrhosiswith ascites/small esophageal and gastric vacices/chronic coagulopathysecondary to alcohol use disorder -Continue lasix and spironolactone  Hypomagnesemia/hypokalemia -Replace K and mag  orally,  Thrombocytopenia secondary to liver disease -Resolved   Deconditioning -PT and OT recommended SNF  Zinc and vitamin C deficiency  -Appreciate dietitian, replace p.o.  Dental caries -Patient will need outpatient dental follow-up  Severe malnutrition in context of chronic illness Nutrition input appreciated  DVT prophylaxis: SCD Code Status: Full  Family Communication: No family at bedside Disposition Plan: SNF placement pending, noted no bed offers so far. he reports his sister who he used to live with passed away due to alcohol liver issues this past saturday  Consultants:   PCCM  GI  Wound care   Procedures:  EGD on 1/6  Antibiotics:  none   Objective: BP 132/68 (BP Location: Right Leg)   Pulse 74   Temp 97.7 F (36.5 C) (Oral)   Resp 18   Ht 5\' 9"  (1.753 m)   Wt 72.7 kg   SpO2 99%   BMI 23.67 kg/m   Intake/Output Summary (Last 24 hours) at 11/08/2019 1216 Last data filed at 11/08/2019 0900 Gross per 24 hour  Intake 480 ml  Output 1400 ml  Net -920 ml   Filed Weights   10/22/19 1837 10/24/19 0400 10/24/19 0941  Weight: 72.6 kg 72.7 kg 72.7 kg    Exam: Patient is examined daily including today on 11/08/2019, exams remain the same as of yesterday except that has changed    General:  Chronically ill appearing, NAD, no tremor observed today  Cardiovascular: RRR  Respiratory: CTABL  Abdomen: Distended , nontender, positive BS  Musculoskeletal: Trace bilateral lower extremity pitting edema has almost resolved  Neuro: alert, oriented   Data Reviewed: Basic Metabolic Panel: Recent Labs  Lab 11/02/19 0429 11/03/19 0259 11/05/19 0232 11/07/19 0236 11/08/19 0224  NA 136 135 138 135 138  K 3.3* 3.9 3.9 3.5 3.6  CL 99 101 106 105 105  CO2 29 27 26 25 26   GLUCOSE 92 94 89 98 106*  BUN 5* 5* <5* 6 6  CREATININE 0.55* 0.57* 0.55* 0.65 0.63  CALCIUM 7.9* 8.0* 8.0* 7.7* 8.1*  MG  --   --   --   --  1.5*   Liver Function  Tests: Recent Labs  Lab 11/02/19 0429 11/08/19 0224  AST 69* 70*  ALT 32 23  ALKPHOS 136* 115  BILITOT 2.3* 1.2  PROT 5.4* 5.9*  ALBUMIN 1.7* 1.8*   No results for input(s): LIPASE, AMYLASE in the last 168 hours. No results for input(s): AMMONIA in the last 168 hours. CBC: Recent Labs  Lab 11/02/19 0429 11/03/19 0259 11/05/19 0232 11/07/19 0236  WBC 6.7 7.4 6.6 6.4  HGB 9.6* 9.9* 9.8* 9.6*  HCT 29.3* 30.7* 29.8* 30.1*  MCV 102.8* 104.4* 104.2* 106.4*  PLT 133* 147* 162 161   Cardiac Enzymes:   No results for input(s): CKTOTAL, CKMB, CKMBINDEX, TROPONINI in the last 168 hours. BNP (last 3 results) No results for input(s): BNP in the last 8760 hours.  ProBNP (last 3 results) No results for input(s): PROBNP in the last 8760 hours.  CBG: No results for input(s): GLUCAP in the last 168 hours.  No results found for this or any previous visit (from the past 240 hour(s)).   Studies: IR ABDOMEN US LIMITED  Result Date: 11/08/2019 CLINICAL DATA:  59 year old male with cirrhosis and ascites EXAM: LIMITED ABDOMEN ULTRASOUND FOR ASCITES TECHNIQUE: Limited ultrasound survey for ascites was performed in all four abdominal quadrants. COMPARISON:  11/05/2019 FINDINGS: Limited ultrasound demonstrates scant ascites. No paracentesis was performed. IMPRESSION: Scant ascites. Electronically  Signed   By: Gilmer Mor D.O.   On: 11/08/2019 09:12    Scheduled Meds: . vitamin C  500 mg Oral BID  . Chlorhexidine Gluconate Cloth  6 each Topical Daily  . famotidine  20 mg Oral BID  . feeding supplement (ENSURE ENLIVE)  237 mL Oral TID BM  . folic acid  1 mg Oral Daily  . furosemide  20 mg Oral Daily  . Gerhardt's butt cream   Topical TID  . mouth rinse  15 mL Mouth Rinse BID  . multivitamin with minerals  1 tablet Oral Daily  . pantoprazole  40 mg Oral BID AC  . spironolactone  50 mg Oral Daily  . sucralfate  1 g Oral TID WC & HS  . thiamine  100 mg Oral Daily  . zinc sulfate  220 mg  Oral Daily    Continuous Infusions:   Time spent: I have personally reviewed and interpreted on  11/08/2019 daily labs,  imagings as discussed above under date review session and assessment and plans.  I reviewed all nursing notes, pharmacy notes, consultant notes,  vitals, pertinent old records  I have discussed plan of care as described above with RN , patient  on 11/08/2019   Albertine Grates MD, PhD, FACP  [facility]  Available via Epic secure chat 7am-7pm for nonurgent issues Please page for urgent issues, pager number available through [email or web address] .   11/08/2019, 12:16 PM  LOS: 16 days

## 2019-11-09 LAB — COMPREHENSIVE METABOLIC PANEL
ALT: 24 U/L (ref 0–44)
AST: 73 U/L — ABNORMAL HIGH (ref 15–41)
Albumin: 1.9 g/dL — ABNORMAL LOW (ref 3.5–5.0)
Alkaline Phosphatase: 106 U/L (ref 38–126)
Anion gap: 8 (ref 5–15)
BUN: 5 mg/dL — ABNORMAL LOW (ref 6–20)
CO2: 26 mmol/L (ref 22–32)
Calcium: 8.2 mg/dL — ABNORMAL LOW (ref 8.9–10.3)
Chloride: 105 mmol/L (ref 98–111)
Creatinine, Ser: 0.56 mg/dL — ABNORMAL LOW (ref 0.61–1.24)
GFR calc Af Amer: >60 mL/min (ref >60)
GFR calc non Af Amer: >60 mL/min (ref >60)
Glucose, Bld: 91 mg/dL (ref 70–99)
Potassium: 3.7 mmol/L (ref 3.5–5.1)
Sodium: 139 mmol/L (ref 135–145)
Total Bilirubin: 1.2 mg/dL (ref 0.3–1.2)
Total Protein: 5.9 g/dL — ABNORMAL LOW (ref 6.5–8.1)

## 2019-11-09 LAB — CBC
HCT: 32.7 % — ABNORMAL LOW (ref 39.0–52.0)
Hemoglobin: 10.5 g/dL — ABNORMAL LOW (ref 13.0–17.0)
MCH: 33.8 pg (ref 26.0–34.0)
MCHC: 32.1 g/dL (ref 30.0–36.0)
MCV: 105.1 fL — ABNORMAL HIGH (ref 80.0–100.0)
Platelets: 170 10*3/uL (ref 150–400)
RBC: 3.11 MIL/uL — ABNORMAL LOW (ref 4.22–5.81)
RDW: 19.1 % — ABNORMAL HIGH (ref 11.5–15.5)
WBC: 6.2 10*3/uL (ref 4.0–10.5)
nRBC: 0 % (ref 0.0–0.2)

## 2019-11-09 LAB — MAGNESIUM: Magnesium: 1.6 mg/dL — ABNORMAL LOW (ref 1.7–2.4)

## 2019-11-09 MED ORDER — POTASSIUM CHLORIDE CRYS ER 20 MEQ PO TBCR
20.0000 meq | EXTENDED_RELEASE_TABLET | Freq: Every day | ORAL | Status: AC
  Administered 2019-11-09 – 2019-11-11 (×3): 20 meq via ORAL
  Filled 2019-11-09 (×3): qty 1

## 2019-11-09 NOTE — Progress Notes (Signed)
Physical Therapy Treatment Patient Details Name: Tom Bender MRN: [id number] DOB: 1961/07/14 Today's Date: 11/09/2019    History of Present Illness Pt is 59 year old male with PMH significant for alcohol abuse that is continuous, liver cirrhosis with varices and DVTs.  Patient was admitted with hematochezia and dark blood tinged emesis.  Pt s/p EGD revealed small esophageal and query small proximal gastric varices with mild lower Weiss tear.     PT Comments    Pt making steady progress with mobility. Had a very lengthy discussion with pt regarding disposition location. The house that pt was living in with his sister (who recently passed away this past weekend) is apparently unlivable and needs extensive plumbing work. His intentions were to help his sister repair the house until he was admitted to the hospital. He does not have any reliable family members or friends that he could stay with upon d/c. Pt is interested in ETOH rehab; however, uncertain of his resources to fund that stay. Unable to determine another option for pt other than potential shelter placement at this time. Spoke with SW as well regarding this situation.   Pt would continue to benefit from skilled physical therapy services at this time while admitted and after d/c to address the below listed limitations in order to improve overall safety and independence with functional mobility.     Follow Up Recommendations  Home health PT;Supervision for mobility/OOB;Other (comment)(?shelter placement - house is unlivable, nowhere to go)     Equipment Recommendations  Rolling walker with 5" wheels;3in1 (PT)    Recommendations for Other Services       Precautions / Restrictions Precautions Precautions: Fall Restrictions Weight Bearing Restrictions: No    Mobility  Bed Mobility Overal bed mobility: Modified Independent                Transfers Overall transfer level: Needs assistance Equipment used: Rolling walker  (2 wheeled) Transfers: Sit to/from Stand Sit to Stand: Supervision         General transfer comment: good technique utilized, supervision for safety  Ambulation/Gait Ambulation/Gait assistance: Min Emergency planning/management officer (Feet): 150 Feet Assistive device: Rolling walker (2 wheeled) Gait Pattern/deviations: Step-through pattern;Decreased stride length;Trunk flexed Gait velocity: decreased   General Gait Details: pt with mild instability but greatly improved since previous session; pt on RA throughout with SPO2 maintaining >93%   Stairs             Wheelchair Mobility    Modified Rankin (Stroke Patients Only)       Balance Overall balance assessment: Needs assistance Sitting-balance support: Feet supported Sitting balance-Leahy Scale: Good     Standing balance support: During functional activity;Bilateral upper extremity supported;Single extremity supported Standing balance-Leahy Scale: Poor                              Cognition Arousal/Alertness: Awake/alert Behavior During Therapy: WFL for tasks assessed/performed Overall Cognitive Status: Within Functional Limits for tasks assessed                                        Exercises      General Comments        Pertinent Vitals/Pain Pain Assessment: Faces Faces Pain Scale: Hurts little more Pain Location: back (chronic) Pain Descriptors / Indicators: Discomfort;Guarding;Sore Pain Intervention(s): Monitored during session;Repositioned    Home Living  Prior Function            PT Goals (current goals can now be found in the care plan section) Acute Rehab PT Goals PT Goal Formulation: With patient Time For Goal Achievement: 11/21/19 Potential to Achieve Goals: Fair Progress towards PT goals: Progressing toward goals    Frequency    Min 3X/week      PT Plan Discharge plan needs to be updated    Co-evaluation               AM-PAC PT "6 Clicks" Mobility   Outcome Measure  Help needed turning from your back to your side while in a flat bed without using bedrails?: None Help needed moving from lying on your back to sitting on the side of a flat bed without using bedrails?: None Help needed moving to and from a bed to a chair (including a wheelchair)?: A Little Help needed standing up from a chair using your arms (e.g., wheelchair or bedside chair)?: None Help needed to walk in hospital room?: A Little Help needed climbing 3-5 steps with a railing? : A Lot 6 Click Score: 20    End of Session Equipment Utilized During Treatment: Gait belt Activity Tolerance: Patient limited by fatigue Patient left: in bed;with call bell/phone within reach Nurse Communication: Mobility status PT Visit Diagnosis: Unsteadiness on feet (R26.81);Muscle weakness (generalized) (M62.81)     Time: 0630-1601 PT Time Calculation (min) (ACUTE ONLY): 47 min  Charges:  $Gait Training: 8-22 mins $Therapeutic Activity: 23-37 mins                     Anastasio Champion, DPT  Acute Rehabilitation Services Pager [phone] Office [place] 11/09/2019, 1:22 PM

## 2019-11-09 NOTE — Plan of Care (Signed)
  Problem: Education: Goal: Knowledge about tracheostomy care/management will improve Outcome: Progressing   Problem: Activity: Goal: Ability to tolerate increased activity will improve Outcome: Progressing   Problem: Education: Goal: Knowledge of General Education information will improve Description: Including pain rating scale, medication(s)/side effects and non-pharmacologic comfort measures Outcome: Progressing   Problem: Health Behavior/Discharge Planning: Goal: Ability to manage health-related needs will improve Outcome: Progressing   Problem: Activity: Goal: Risk for activity intolerance will decrease Outcome: Progressing   Problem: Nutrition: Goal: Adequate nutrition will be maintained Outcome: Progressing   Problem: Coping: Goal: Level of anxiety will decrease Outcome: Progressing   Problem: Elimination: Goal: Will not experience complications related to bowel motility Outcome: Progressing   Problem: Pain Managment: Goal: General experience of comfort will improve Outcome: Progressing   Problem: Safety: Goal: Ability to remain free from injury will improve Outcome: Progressing   Problem: Skin Integrity: Goal: Risk for impaired skin integrity will decrease Outcome: Progressing

## 2019-11-09 NOTE — Progress Notes (Signed)
PROGRESS NOTE  Tom Bender AST:[id number] DOB: 08/11/1961 DOA: 10/22/2019 PCP: Patient, No Pcp Per  Brief Narrative:  Tom Bender is a 59 y.o.malewith medical history significant ofEtOH abuse that is ongoing, cirrhosis and varices, DTs.  Patient presents to the ED with c/o hematochezia.He states that he has been having generalized abdominal pain since having gallbladder surgery last June. For the last 2-3 weeks, he has been having rectal bleeding. Stool is initially red and is now black. He has also had nausea and vomiting without any blood in the emesis. He feels generally weak and has been falling. He continues to consume alcohol and admits to drinking 40 ounces of beer today. He usually drinks 40-80 ounces of beer a day. He denies other drug use. He denies exposure to COVID-19.  On further review, patient was admitted in Oct and again in Nov at [facility] with reports of GIB. EGD in Oct just showed small non-bleeding varicies, no GAVE. Colonoscopy in Aug with polypectomy done after c/o rectal bleeding then as well and bleeding thought to be due to internal hemorrhoids.  In Nov he was admitted for reported bleeding and initial S.Tachycardia. Went into withdrawals the next day. Was hemoccult positive then, though HGB remained stable so they didn't scope him. Was found to have bacterial PNA during that admit.  Patient admitted to [facility] on 10/22/2019 with GI bleed. He was given FFP, packed red blood cells, IV fluids, as he was noted to be hypotensive. Patient was admitted to ICU. Gastroenterology consulted, status post EGD. Continues to have abdominal pain, added GI cocktail with lidocaine.Attained abdominal ultrasound showing small ascites, status post paracentesis 1/11 yielding 2.3L. Additional paracentesis 1/18 yielding 2.5 L.     HPI/Recap of past 24 hours:  Reports feeling better after having bowel movement, he denies blood in  stool. Abdomen is less distended On room air ,no hypoxia  Assessment/Plan: Principal Problem:   GI bleed Active Problems:   Hyponatremia   Benign essential HTN   Alcohol abuse   Alcoholic hepatitis   Sinus tachycardia   Pressure injury of skin   Mallory-Weiss tear   Protein-calorie malnutrition, severe   Hypovolemic shock -Secondary to hemorrhagic cause, GI losses as well as poor hydration and alcohol use -Resolved  GI bleed, hematemesis, melena, acute blood loss anemia, symptomatic anemia -Transfused 1u FFP, 2uPRBC 10/23/2019 -EGD 10/24/2019 showed small esophageal varices without stigmata. Small Mallory-Weiss tear without stigmata, which explains coffee-ground emesis. Possible small proximal gastric varices. Mild portal gastropathy. Alcoholic hepatitis. Chronic alcoholism. Recommended to stop octreotide, continue PPI. Patient to follow-up with his primary gastroenterologist in [facility] Dr. Marcelene Butte -Hemoglobin has remained stable , currently on ppi and carafate  Abdominal pain /distention -Likely secondary to the above --Abd [place]: Cirrhotic/steatotic appearance of liver with small volume ascites that is increased from 1/5 study. S/pparacentesis 1/11 yielding 2.3 L  -S/p paracentesis 1/18 yielding 2.5 L  -Attempt for another therapeutic paracentesis on 1/21 , however Limited US Abdomen performed.  Patient has a trace amount of reaccumulated fluid not amenable to paracentesis at this time.   -KUB showed constipation, start lactulose   Hepatic cirrhosiswith ascites/small esophageal and gastric vacices/chronic coagulopathysecondary to alcohol use disorder -Continue lasix and spironolactone  Hypomagnesemia/hypokalemia -Replace K and mag orally,  Thrombocytopenia secondary to liver disease -Resolved   Deconditioning -PT and OT recommended SNF  Zinc and vitamin C deficiency  -Appreciate dietitian, replace p.o.  Dental caries -Patient will need outpatient  dental follow-up  Severe malnutrition in context of chronic illness Nutrition input appreciated  DVT prophylaxis: SCD Code Status: Full  Family Communication: No family at bedside Disposition Plan: SNF placement pending, noted no bed offers so far. He is uninsured,  he reports his sister who he used to live with passed away due to alcohol liver issues this past saturday  Consultants:   PCCM  GI  Wound care   Procedures:  EGD on 1/6  Antibiotics:  none   Objective: BP 101/65 (BP Location: Right Arm)   Pulse 81   Temp 98.3 F (36.8 C) (Oral)   Resp 16   Ht 5\' 9"  (1.753 m)   Wt 72.7 kg   SpO2 99%   BMI 23.67 kg/m   Intake/Output Summary (Last 24 hours) at 11/09/2019 1316 Last data filed at 11/09/2019 1245 Gross per 24 hour  Intake --  Output 1150 ml  Net -1150 ml   Filed Weights   10/22/19 1837 10/24/19 0400 10/24/19 0941  Weight: 72.6 kg 72.7 kg 72.7 kg    Exam: Patient is examined daily including today on 11/09/2019, exams remain the same as of yesterday except that has changed    General:  Chronically ill appearing, NAD, aaox3  Cardiovascular: RRR  Respiratory: CTABL  Abdomen: Distended , nontender, positive BS  Musculoskeletal: Trace bilateral lower extremity pitting edema has almost resolved  Neuro: alert, oriented   Data Reviewed: Basic Metabolic Panel: Recent Labs  Lab 11/03/19 0259 11/05/19 0232 11/07/19 0236 11/08/19 0224 11/09/19 0543  NA 135 138 135 138 139  K 3.9 3.9 3.5 3.6 3.7  CL 101 106 105 105 105  CO2 27 26 25 26 26   GLUCOSE 94 89 98 106* 91  BUN 5* <5* 6 6 5*  CREATININE 0.57* 0.55* 0.65 0.63 0.56*  CALCIUM 8.0* 8.0* 7.7* 8.1* 8.2*  MG  --   --   --  1.5* 1.6*   Liver Function Tests: Recent Labs  Lab 11/08/19 0224 11/09/19 0543  AST 70* 73*  ALT 23 24  ALKPHOS 115 106  BILITOT 1.2 1.2  PROT 5.9* 5.9*  ALBUMIN 1.8* 1.9*   No results for input(s): LIPASE, AMYLASE in the last 168 hours. No results  for input(s): AMMONIA in the last 168 hours. CBC: Recent Labs  Lab 11/03/19 0259 11/05/19 0232 11/07/19 0236 11/09/19 0543  WBC 7.4 6.6 6.4 6.2  HGB 9.9* 9.8* 9.6* 10.5*  HCT 30.7* 29.8* 30.1* 32.7*  MCV 104.4* 104.2* 106.4* 105.1*  PLT 147* 162 161 170   Cardiac Enzymes:   No results for input(s): CKTOTAL, CKMB, CKMBINDEX, TROPONINI in the last 168 hours. BNP (last 3 results) No results for input(s): BNP in the last 8760 hours.  ProBNP (last 3 results) No results for input(s): PROBNP in the last 8760 hours.  CBG: No results for input(s): GLUCAP in the last 168 hours.  No results found for this or any previous visit (from the past 240 hour(s)).   Studies: DG Abd 1 View  Result Date: 11/08/2019 CLINICAL DATA:  Abdominal distension with generalized abdominal pain, constipation and diarrhea 3 days. EXAM: ABDOMEN - 1 VIEW COMPARISON:  10/25/2019 FINDINGS: Bowel gas pattern is nonobstructive with mild fecal retention throughout the colon. There are a few air-filled nondilated small bowel loops over the right mid to lower abdomen. No free peritoneal air. Previous cholecystectomy. Remainder of the exam is unchanged. IMPRESSION: Nonobstructive bowel gas pattern with mild fecal retention throughout the colon. Electronically Signed   By: Quillian Quince  Micheline Maze M.D.   On: 11/08/2019 13:48    Scheduled Meds: . vitamin C  500 mg Oral BID  . bisacodyl  10 mg Rectal Daily  . Chlorhexidine Gluconate Cloth  6 each Topical Daily  . famotidine  20 mg Oral BID  . feeding supplement (ENSURE ENLIVE)  237 mL Oral TID BM  . folic acid  1 mg Oral Daily  . furosemide  20 mg Oral Daily  . Gerhardt's butt cream   Topical TID  . lactulose  10 g Oral TID  . magnesium oxide  400 mg Oral BID  . mouth rinse  15 mL Mouth Rinse BID  . multivitamin with minerals  1 tablet Oral Daily  . pantoprazole  40 mg Oral BID AC  . spironolactone  50 mg Oral Daily  . sucralfate  1 g Oral TID WC & HS  . thiamine  100 mg Oral  Daily  . zinc sulfate  220 mg Oral Daily    Continuous Infusions:   Time spent: I have personally reviewed and interpreted on  11/09/2019 daily labs,  imagings as discussed above under date review session and assessment and plans.  I reviewed all nursing notes, pharmacy notes, consultant notes,  vitals, pertinent old records  I have discussed plan of care as described above with RN , patient  on 11/09/2019   Albertine Grates MD, PhD, FACP  [facility]  Available via Epic secure chat 7am-7pm for nonurgent issues Please page for urgent issues, pager number available through [email or web address] .   11/09/2019, 1:16 PM  LOS: 17 days

## 2019-11-10 MED ORDER — SPIRONOLACTONE 25 MG PO TABS
50.0000 mg | ORAL_TABLET | ORAL | Status: DC
  Administered 2019-11-14: 10:00:00 50 mg via ORAL
  Filled 2019-11-10 (×2): qty 2

## 2019-11-10 MED ORDER — FUROSEMIDE 20 MG PO TABS
20.0000 mg | ORAL_TABLET | ORAL | Status: DC
  Administered 2019-11-14: 20 mg via ORAL
  Filled 2019-11-10 (×2): qty 1

## 2019-11-10 NOTE — Plan of Care (Signed)
  Problem: Education: Goal: Knowledge about tracheostomy care/management will improve Outcome: Progressing   Problem: Activity: Goal: Ability to tolerate increased activity will improve Outcome: Progressing   Problem: Activity: Goal: Risk for activity intolerance will decrease Outcome: Progressing   Problem: Nutrition: Goal: Adequate nutrition will be maintained Outcome: Progressing   Problem: Coping: Goal: Level of anxiety will decrease Outcome: Progressing   Problem: Elimination: Goal: Will not experience complications related to bowel motility Outcome: Progressing Goal: Will not experience complications related to urinary retention Outcome: Progressing   Problem: Pain Managment: Goal: General experience of comfort will improve Outcome: Progressing   Problem: Safety: Goal: Ability to remain free from injury will improve Outcome: Progressing   Problem: Skin Integrity: Goal: Risk for impaired skin integrity will decrease Outcome: Progressing

## 2019-11-10 NOTE — Plan of Care (Signed)
  Problem: Education: Goal: Knowledge about tracheostomy care/management will improve Outcome: Progressing   Problem: Activity: Goal: Ability to tolerate increased activity will improve Outcome: Progressing   Problem: Education: Goal: Knowledge of General Education information will improve Description: Including pain rating scale, medication(s)/side effects and non-pharmacologic comfort measures Outcome: Progressing   Problem: Clinical Measurements: Goal: Ability to maintain clinical measurements within normal limits will improve Outcome: Progressing   Problem: Activity: Goal: Risk for activity intolerance will decrease Outcome: Progressing

## 2019-11-10 NOTE — Progress Notes (Signed)
PROGRESS NOTE  Tom Bender ERX:[id number] DOB: 1961/07/28 DOA: 10/22/2019 PCP: Patient, No Pcp Per  Brief Narrative:  Tom Bender is a 59 y.o.malewith medical history significant ofEtOH abuse that is ongoing, cirrhosis and varices, DTs.  Patient presents to the ED with c/o hematochezia.He states that he has been having generalized abdominal pain since having gallbladder surgery last June. For the last 2-3 weeks, he has been having rectal bleeding. Stool is initially red and is now black. He has also had nausea and vomiting without any blood in the emesis. He feels generally weak and has been falling. He continues to consume alcohol and admits to drinking 40 ounces of beer today. He usually drinks 40-80 ounces of beer a day. He denies other drug use. He denies exposure to COVID-19.  On further review, patient was admitted in Oct and again in Nov at [facility] with reports of GIB. EGD in Oct just showed small non-bleeding varicies, no GAVE. Colonoscopy in Aug with polypectomy done after c/o rectal bleeding then as well and bleeding thought to be due to internal hemorrhoids.  In Nov he was admitted for reported bleeding and initial S.Tachycardia. Went into withdrawals the next day. Was hemoccult positive then, though HGB remained stable so they didn't scope him. Was found to have bacterial PNA during that admit.  Patient admitted to [facility] on 10/22/2019 with GI bleed. He was given FFP, packed red blood cells, IV fluids, as he was noted to be hypotensive. Patient was admitted to ICU. Gastroenterology consulted, status post EGD. Continues to have abdominal pain, added GI cocktail with lidocaine.Attained abdominal ultrasound showing small ascites, status post paracentesis 1/11 yielding 2.3L. Additional paracentesis 1/18 yielding 2.5 L.     HPI/Recap of past 24 hours:  Repots legs are shaking and feeling dizzy when standing up, otherwise  overall has improved,  Abdomen is less distended, denies abdominal pain, no nausea ,no vomiting, denies blood in the stool On room air ,no hypoxia  Assessment/Plan: Principal Problem:   GI bleed Active Problems:   Hyponatremia   Benign essential HTN   Alcohol abuse   Alcoholic hepatitis   Sinus tachycardia   Pressure injury of skin   Mallory-Weiss tear   Protein-calorie malnutrition, severe   Hypovolemic shock -Secondary to hemorrhagic cause, GI losses as well as poor hydration and alcohol use -Resolved  GI bleed, hematemesis, melena, acute blood loss anemia, symptomatic anemia -Transfused 1u FFP, 2uPRBC 10/23/2019 -EGD 10/24/2019 showed small esophageal varices without stigmata. Small Mallory-Weiss tear without stigmata, which explains coffee-ground emesis. Possible small proximal gastric varices. Mild portal gastropathy. Alcoholic hepatitis. Chronic alcoholism. Recommended to stop octreotide, continue PPI. Patient to follow-up with his primary gastroenterologist in [facility] Dr. Marcelene Butte -Hemoglobin has remained stable , currently on ppi and carafate  Abdominal pain /distention -Likely secondary to the above --Abd [place]: Cirrhotic/steatotic appearance of liver with small volume ascites that is increased from 1/5 study. S/pparacentesis 1/11 yielding 2.3 L  -S/p paracentesis 1/18 yielding 2.5 L  -Attempt for another therapeutic paracentesis on 1/21 , however Limited US Abdomen performed.  Patient has a trace amount of reaccumulated fluid not amenable to paracentesis at this time.   -KUB showed constipation, started on  lactulose   Hepatic cirrhosiswith ascites/small esophageal and gastric vacices/chronic coagulopathysecondary to alcohol use disorder -decrease  lasix and spironolactone from daily to q48hrs now lower extremity edema has resolved, no recurrent ascites on abdominal ultrasound obtained on January 21, and report feeling dizzy when standing  up (orthostatic  vital signs obtained, blood pressure no significant changes about standing, heart rate did increase from 85-1 07 upon standing) -Monitor volume status closely, adjust Lasix and spironolactone accordingly  Hypomagnesemia/hypokalemia -Replace K and mag orally,  Thrombocytopenia secondary to liver disease -Resolved   Deconditioning -PT and OT recommended SNF  Zinc and vitamin C deficiency  -Appreciate dietitian, replace p.o.  Dental caries -Patient will need outpatient dental follow-up  Severe malnutrition in context of chronic illness Nutrition input appreciated  DVT prophylaxis: SCD Code Status: Full  Family Communication: No family at bedside Disposition Plan: SNF placement pending, noted no bed offers so far. He is uninsured,  he reports his sister who he used to live with passed away due to alcohol liver issues this past Saturday, he reports now he is homeless  Consultants:   PCCM  GI  Wound care   Procedures:  EGD on 1/6  Antibiotics:  none   Objective: BP 99/74   Pulse 88   Temp 98.2 F (36.8 C) (Oral)   Resp 16   Ht 5\' 9"  (1.753 m)   Wt 72.7 kg   SpO2 99%   BMI 23.67 kg/m   Intake/Output Summary (Last 24 hours) at 11/10/2019 1312 Last data filed at 11/09/2019 1807 Gross per 24 hour  Intake 120 ml  Output 500 ml  Net -380 ml   Filed Weights   10/22/19 1837 10/24/19 0400 10/24/19 0941  Weight: 72.6 kg 72.7 kg 72.7 kg    Exam: Patient is examined daily including today on 11/10/2019, exams remain the same as of yesterday except that has changed    General:  Chronically ill appearing, NAD, aaox3  Cardiovascular: RRR  Respiratory: CTABL  Abdomen: Distended , nontender, positive BS  Musculoskeletal: Trace bilateral lower extremity pitting edema has almost resolved  Neuro: alert, oriented   Data Reviewed: Basic Metabolic Panel: Recent Labs  Lab 11/05/19 0232 11/07/19 0236 11/08/19 0224 11/09/19 0543  NA 138 135 138 139    K 3.9 3.5 3.6 3.7  CL 106 105 105 105  CO2 26 25 26 26   GLUCOSE 89 98 106* 91  BUN <5* 6 6 5*  CREATININE 0.55* 0.65 0.63 0.56*  CALCIUM 8.0* 7.7* 8.1* 8.2*  MG  --   --  1.5* 1.6*   Liver Function Tests: Recent Labs  Lab 11/08/19 0224 11/09/19 0543  AST 70* 73*  ALT 23 24  ALKPHOS 115 106  BILITOT 1.2 1.2  PROT 5.9* 5.9*  ALBUMIN 1.8* 1.9*   No results for input(s): LIPASE, AMYLASE in the last 168 hours. No results for input(s): AMMONIA in the last 168 hours. CBC: Recent Labs  Lab 11/05/19 0232 11/07/19 0236 11/09/19 0543  WBC 6.6 6.4 6.2  HGB 9.8* 9.6* 10.5*  HCT 29.8* 30.1* 32.7*  MCV 104.2* 106.4* 105.1*  PLT 162 161 170   Cardiac Enzymes:   No results for input(s): CKTOTAL, CKMB, CKMBINDEX, TROPONINI in the last 168 hours. BNP (last 3 results) No results for input(s): BNP in the last 8760 hours.  ProBNP (last 3 results) No results for input(s): PROBNP in the last 8760 hours.  CBG: No results for input(s): GLUCAP in the last 168 hours.  No results found for this or any previous visit (from the past 240 hour(s)).   Studies: No results found.  Scheduled Meds: . vitamin C  500 mg Oral BID  . Chlorhexidine Gluconate Cloth  6 each Topical Daily  . famotidine  20 mg Oral BID  .  feeding supplement (ENSURE ENLIVE)  237 mL Oral TID BM  . folic acid  1 mg Oral Daily  . [START ON 11/12/2019] furosemide  20 mg Oral Q48H  . Gerhardt's butt cream   Topical TID  . lactulose  10 g Oral TID  . magnesium oxide  400 mg Oral BID  . mouth rinse  15 mL Mouth Rinse BID  . multivitamin with minerals  1 tablet Oral Daily  . pantoprazole  40 mg Oral BID AC  . potassium chloride  20 mEq Oral Daily  . [START ON 11/12/2019] spironolactone  50 mg Oral Q48H  . sucralfate  1 g Oral TID WC & HS  . thiamine  100 mg Oral Daily  . zinc sulfate  220 mg Oral Daily    Continuous Infusions:   Time spent: I have personally reviewed and interpreted on  11/10/2019 daily  labs,  imagings as discussed above under date review session and assessment and plans.  I reviewed all nursing notes, pharmacy notes, consultant notes,  vitals, pertinent old records  I have discussed plan of care as described above with RN , patient  on 11/10/2019   Albertine Grates MD, PhD, FACP  [facility]  Available via Epic secure chat 7am-7pm for nonurgent issues Please page for urgent issues, pager number available through [email or web address] .   11/10/2019, 1:12 PM  LOS: 18 days

## 2019-11-10 NOTE — TOC Progression Note (Signed)
Transition of Care [facility]) - Progression Note    Patient Details  Name: Tom Bender MRN: [id number] Date of Birth: 11-11-1960  Transition of Care [facility]) CM/SW Contact  Doy Hutching, [place] Phone Number: 11/10/2019, 1:31 PM  Clinical Narrative:    Pt ambulating around 159ft. Barriers to discharge include lack of housing, no insurance and no support per pt from family. Pt also with hx of substance use. TOC team continues to seek safe discharge disposition.   Expected Discharge Plan: Skilled Nursing Facility Barriers to Discharge: Inadequate or no insurance, Continued Medical Work up, SNF Pending bed offer  Expected Discharge Plan and Services Expected Discharge Plan: Skilled Nursing Facility Living arrangements for the past 2 months: Single Family Home   Readmission Risk Interventions No flowsheet data found.

## 2019-11-11 NOTE — Progress Notes (Signed)
PROGRESS NOTE  Tom Bender NVB:[id number] DOB: [birth year] DOA: 10/22/2019 PCP: Patient, No Pcp Per  Brief Narrative:  Tom Bender is a 59 y.o.malewith medical history significant ofEtOH abuse that is ongoing, cirrhosis and varices, DTs.  Patient presents to the ED with c/o hematochezia.He states that he has been having generalized abdominal pain since having gallbladder surgery last June. For the last 2-3 weeks, he has been having rectal bleeding. Stool is initially red and is now black. He has also had nausea and vomiting without any blood in the emesis. He feels generally weak and has been falling. He continues to consume alcohol and admits to drinking 40 ounces of beer today. He usually drinks 40-80 ounces of beer a day. He denies other drug use. He denies exposure to COVID-19.  On further review, patient was admitted in Oct and again in Nov at [facility] with reports of GIB. EGD in Oct just showed small non-bleeding varicies, no GAVE. Colonoscopy in Aug with polypectomy done after c/o rectal bleeding then as well and bleeding thought to be due to internal hemorrhoids.  In Nov he was admitted for reported bleeding and initial S.Tachycardia. Went into withdrawals the next day. Was hemoccult positive then, though HGB remained stable so they didn't scope him. Was found to have bacterial PNA during that admit.  Patient admitted to [facility] on 10/22/2019 with GI bleed. He was given FFP, packed red blood cells, IV fluids, as he was noted to be hypotensive. Patient was admitted to ICU. Gastroenterology consulted, status post EGD. Continues to have abdominal pain, added GI cocktail with lidocaine.Attained abdominal ultrasound showing small ascites, status post paracentesis 1/11 yielding 2.3L. Additional paracentesis 1/18 yielding 2.5 L.     HPI/Recap of past 24 hours:  Feeling stronger,  Abdomen is less distended, denies abdominal pain, no  nausea ,no vomiting,  + bm, denies blood in the stool On room air ,no hypoxia  Assessment/Plan: Principal Problem:   GI bleed Active Problems:   Hyponatremia   Benign essential HTN   Alcohol abuse   Alcoholic hepatitis   Sinus tachycardia   Pressure injury of skin   Mallory-Weiss tear   Protein-calorie malnutrition, severe   Hypovolemic shock -Secondary to hemorrhagic cause, GI losses as well as poor hydration and alcohol use -Resolved  GI bleed, hematemesis, melena, acute blood loss anemia, symptomatic anemia -Transfused 1u FFP, 2uPRBC 10/23/2019 -EGD 10/24/2019 showed small esophageal varices without stigmata. Small Mallory-Weiss tear without stigmata, which explains coffee-ground emesis. Possible small proximal gastric varices. Mild portal gastropathy. Alcoholic hepatitis. Chronic alcoholism. Recommended to stop octreotide, continue PPI. Patient to follow-up with his primary gastroenterologist in [facility] Dr. Marcelene Butte -Hemoglobin has remained stable , currently on ppi and carafate -no n/v, +bm, no blood in stool, hgb stable  Abdominal pain /distention -Likely secondary to the above --Abd [place]: Cirrhotic/steatotic appearance of liver with small volume ascites that is increased from 1/5 study. S/pparacentesis 1/11 yielding 2.3 L  -S/p paracentesis 1/18 yielding 2.5 L  -Attempt for another therapeutic paracentesis on 1/21 , however Limited US Abdomen performed.  Patient has a trace amount of reaccumulated fluid not amenable to paracentesis at this time.   -KUB showed constipation, started on  Lactulose -improving   Hepatic cirrhosiswith ascites/small esophageal and gastric vacices/chronic coagulopathysecondary to alcohol use disorder -decrease  lasix and spironolactone from daily to q48hrs now lower extremity edema has resolved, no recurrent ascites on abdominal ultrasound obtained on January 21, and report feeling dizzy when  standing up (orthostatic vital signs  obtained, blood pressure no significant changes about standing, heart rate did increase from 85-1 07 upon standing) -Monitor volume status closely, adjust Lasix and spironolactone accordingly  Hypomagnesemia/hypokalemia -Replace K and mag orally,  Thrombocytopenia secondary to liver disease -Resolved   Deconditioning -PT and OT recommended SNF  Zinc and vitamin C deficiency  -Appreciate dietitian, replace p.o.  Dental caries -Patient will need outpatient dental follow-up  Severe malnutrition in context of chronic illness Nutrition input appreciated  DVT prophylaxis: SCD Code Status: Full  Family Communication: No family at bedside Disposition Plan: SNF placement pending, noted no bed offers so far. He is uninsured,  he reports his sister who he used to live with passed away due to alcohol liver issues this past Saturday, he reports now he is homeless  Consultants:   PCCM  GI  Wound care   Procedures:  EGD on 1/6  Antibiotics:  none   Objective: BP 102/74 (BP Location: Right Arm)   Pulse 70   Temp 98.3 F (36.8 C) (Oral)   Resp 20   Ht 5\' 9"  (1.753 m)   Wt 72.7 kg   SpO2 97%   BMI 23.67 kg/m   Intake/Output Summary (Last 24 hours) at 11/11/2019 0736 Last data filed at 11/11/2019 0300 Gross per 24 hour  Intake --  Output 1600 ml  Net -1600 ml   Filed Weights   10/22/19 1837 10/24/19 0400 10/24/19 0941  Weight: 72.6 kg 72.7 kg 72.7 kg    Exam: Patient is examined daily including today on 11/11/2019, exams remain the same as of yesterday except that has changed    General:  Chronically ill appearing, NAD, aaox3  Cardiovascular: RRR  Respiratory: CTABL  Abdomen: less Distended , nontender, positive BS  Musculoskeletal: Trace bilateral lower extremity pitting edema has almost resolved  Neuro: alert, oriented   Data Reviewed: Basic Metabolic Panel: Recent Labs  Lab 11/05/19 0232 11/07/19 0236 11/08/19 0224 11/09/19 0543  NA  138 135 138 139  K 3.9 3.5 3.6 3.7  CL 106 105 105 105  CO2 26 25 26 26   GLUCOSE 89 98 106* 91  BUN <5* 6 6 5*  CREATININE 0.55* 0.65 0.63 0.56*  CALCIUM 8.0* 7.7* 8.1* 8.2*  MG  --   --  1.5* 1.6*   Liver Function Tests: Recent Labs  Lab 11/08/19 0224 11/09/19 0543  AST 70* 73*  ALT 23 24  ALKPHOS 115 106  BILITOT 1.2 1.2  PROT 5.9* 5.9*  ALBUMIN 1.8* 1.9*   No results for input(s): LIPASE, AMYLASE in the last 168 hours. No results for input(s): AMMONIA in the last 168 hours. CBC: Recent Labs  Lab 11/05/19 0232 11/07/19 0236 11/09/19 0543  WBC 6.6 6.4 6.2  HGB 9.8* 9.6* 10.5*  HCT 29.8* 30.1* 32.7*  MCV 104.2* 106.4* 105.1*  PLT 162 161 170   Cardiac Enzymes:   No results for input(s): CKTOTAL, CKMB, CKMBINDEX, TROPONINI in the last 168 hours. BNP (last 3 results) No results for input(s): BNP in the last 8760 hours.  ProBNP (last 3 results) No results for input(s): PROBNP in the last 8760 hours.  CBG: No results for input(s): GLUCAP in the last 168 hours.  No results found for this or any previous visit (from the past 240 hour(s)).   Studies: No results found.  Scheduled Meds: . vitamin C  500 mg Oral BID  . Chlorhexidine Gluconate Cloth  6 each Topical Daily  . famotidine  20 mg Oral BID  . feeding supplement (ENSURE ENLIVE)  237 mL Oral TID BM  . folic acid  1 mg Oral Daily  . [START ON 11/12/2019] furosemide  20 mg Oral Q48H  . Gerhardt's butt cream   Topical TID  . lactulose  10 g Oral TID  . magnesium oxide  400 mg Oral BID  . mouth rinse  15 mL Mouth Rinse BID  . multivitamin with minerals  1 tablet Oral Daily  . pantoprazole  40 mg Oral BID AC  . potassium chloride  20 mEq Oral Daily  . [START ON 11/12/2019] spironolactone  50 mg Oral Q48H  . sucralfate  1 g Oral TID WC & HS  . thiamine  100 mg Oral Daily  . zinc sulfate  220 mg Oral Daily    Continuous Infusions:   Time spent: I have personally reviewed and interpreted on   11/11/2019 daily labs,  imagings as discussed above under date review session and assessment and plans.  I reviewed all nursing notes, pharmacy notes, consultant notes,  vitals, pertinent old records  I have discussed plan of care as described above with RN , patient  on 11/11/2019   Albertine Grates MD, PhD, FACP  [facility]  Available via Epic secure chat 7am-7pm for nonurgent issues Please page for urgent issues, pager number available through [email or web address] .   11/11/2019, 7:36 AM  LOS: 19 days

## 2019-11-11 NOTE — Plan of Care (Signed)
  Problem: Education: Goal: Knowledge about tracheostomy care/management will improve Outcome: Progressing   Problem: Activity: Goal: Ability to tolerate increased activity will improve Outcome: Progressing   Problem: Activity: Goal: Risk for activity intolerance will decrease Outcome: Progressing

## 2019-11-11 NOTE — Plan of Care (Signed)
  Problem: Education: Goal: Knowledge about tracheostomy care/management will improve Outcome: Progressing   Problem: Education: Goal: Knowledge of General Education information will improve Description: Including pain rating scale, medication(s)/side effects and non-pharmacologic comfort measures Outcome: Progressing   Problem: Clinical Measurements: Goal: Ability to maintain clinical measurements within normal limits will improve Outcome: Progressing   Problem: Activity: Goal: Risk for activity intolerance will decrease Outcome: Progressing   Problem: Nutrition: Goal: Adequate nutrition will be maintained Outcome: Progressing   Problem: Safety: Goal: Ability to remain free from injury will improve Outcome: Progressing   Problem: Pain Managment: Goal: General experience of comfort will improve Outcome: Progressing   Problem: Coping: Goal: Level of anxiety will decrease Outcome: Progressing

## 2019-11-11 NOTE — Progress Notes (Signed)
RN spoke with pt son, macgregor aeschliman [phone]) over the phone. RN verified with pt that it was ok to give a brief update to son. Pt requests that son be given very brief updates without many details. Pt states that "pt is doing well," and "pt is eating well today," are acceptable updates but should be kept to that, and nothing beyond that. Will pass along to next shift. Lenisha Lacap Mena Goes, RN 11/11/2019 4:39 PM

## 2019-11-12 DIAGNOSIS — R188 Other ascites: Secondary | ICD-10-CM

## 2019-11-12 DIAGNOSIS — K7460 Unspecified cirrhosis of liver: Secondary | ICD-10-CM

## 2019-11-12 LAB — COMPREHENSIVE METABOLIC PANEL
ALT: 25 U/L (ref 0–44)
AST: 75 U/L — ABNORMAL HIGH (ref 15–41)
Albumin: 2 g/dL — ABNORMAL LOW (ref 3.5–5.0)
Alkaline Phosphatase: 101 U/L (ref 38–126)
Anion gap: 9 (ref 5–15)
BUN: 6 mg/dL (ref 6–20)
CO2: 26 mmol/L (ref 22–32)
Calcium: 8.3 mg/dL — ABNORMAL LOW (ref 8.9–10.3)
Chloride: 102 mmol/L (ref 98–111)
Creatinine, Ser: 0.62 mg/dL (ref 0.61–1.24)
GFR calc Af Amer: >60 mL/min (ref >60)
GFR calc non Af Amer: >60 mL/min (ref >60)
Glucose, Bld: 93 mg/dL (ref 70–99)
Potassium: 3.8 mmol/L (ref 3.5–5.1)
Sodium: 137 mmol/L (ref 135–145)
Total Bilirubin: 1 mg/dL (ref 0.3–1.2)
Total Protein: 6 g/dL — ABNORMAL LOW (ref 6.5–8.1)

## 2019-11-12 LAB — CBC
HCT: 32.7 % — ABNORMAL LOW (ref 39.0–52.0)
Hemoglobin: 10.4 g/dL — ABNORMAL LOW (ref 13.0–17.0)
MCH: 33.5 pg (ref 26.0–34.0)
MCHC: 31.8 g/dL (ref 30.0–36.0)
MCV: 105.5 fL — ABNORMAL HIGH (ref 80.0–100.0)
Platelets: 176 10*3/uL (ref 150–400)
RBC: 3.1 MIL/uL — ABNORMAL LOW (ref 4.22–5.81)
RDW: 17.8 % — ABNORMAL HIGH (ref 11.5–15.5)
WBC: 6.9 10*3/uL (ref 4.0–10.5)
nRBC: 0 % (ref 0.0–0.2)

## 2019-11-12 LAB — MAGNESIUM: Magnesium: 1.7 mg/dL (ref 1.7–2.4)

## 2019-11-12 NOTE — Plan of Care (Signed)
  Problem: Safety: Goal: Ability to remain free from injury will improve Outcome: Progressing   Problem: Skin Integrity: Goal: Risk for impaired skin integrity will decrease Outcome: Progressing   

## 2019-11-12 NOTE — Progress Notes (Addendum)
Physical Therapy Treatment Patient Details Name: Tom Bender MRN: [id number] DOB: 25-Jun-1961 Today's Date: 11/12/2019    History of Present Illness Pt is 59 year old male with PMH significant for alcohol abuse that is continuous, liver cirrhosis with varices and DVTs.  Patient was admitted with hematochezia and dark blood tinged emesis.  Pt s/p EGD revealed small esophageal and query small proximal gastric varices with mild lower Weiss tear.     PT Comments    Continuing work on functional mobility and activity tolerance;  Reports persistent mild dizziness or "blurriness";  Took orthostatic BPs which were on the low side, but did not show a significant drop in SBPs in different positions; HR with normal response to activity;     11/12/19 0918  Orthostatic Lying   BP- Lying 95/78  Pulse- Lying 87  Orthostatic Sitting  BP- Sitting 108/72  Pulse- Sitting 92  Orthostatic Standing at 0 minutes  BP- Standing at 0 minutes 95/56  Pulse- Standing at 0 minutes 106  Orthostatic Standing at 3 minutes  BP- Standing at 3 minutes 104/64 (post hallway amb)  Pulse- Standing at 3 minutes 113     He uses the RW well for more stability with amb -- my understanding is in order to go to Rehab for alcohol use disorder, he would need to be independent with mobility and ADLs (with or without assistive device); he reports a willingness for alcohol rehab -- How much can his insurance cover a rehab program?  Follow Up Recommendations  Home health PT;Supervision for mobility/OOB;Other (comment)(?shelter placement - house is unlivable, nowhere to go)     Equipment Recommendations  Rolling walker with 5" wheels;3in1 (PT)    Recommendations for Other Services       Precautions / Restrictions Precautions Precautions: Fall Precaution Comments: Watch BPs, on the low side; Orthostatics neg for more than 20 mmHg drop in SBP on 1/25    Mobility  Bed Mobility Overal bed mobility: Modified  Independent Bed Mobility: Supine to Sit;Sit to Supine     Supine to sit: Modified independent (Device/Increase time) Sit to supine: Modified independent (Device/Increase time)      Transfers Overall transfer level: Needs assistance Equipment used: Rolling walker (2 wheeled) Transfers: Sit to/from Stand Sit to Stand: Supervision         General transfer comment: good technique utilized, supervision for safety, and cues to self-monitor for activity tolerance  Ambulation/Gait Ambulation/Gait assistance: Supervision Gait Distance (Feet): 100 Feet Assistive device: Rolling walker (2 wheeled) Gait Pattern/deviations: Step-through pattern;Decreased stride length;Trunk flexed Gait velocity: decreased   General Gait Details: Mild instability persists, but good use of RW for better stability; Cues to self-monitor for activity tolerance; some dizziness or "blurriness" present, but not so much so as to stop walking   Stairs             Wheelchair Mobility    Modified Rankin (Stroke Patients Only)       Balance             Standing balance-Leahy Scale: Poor Standing balance comment: reliant on at least one UE support for balance                            Cognition Arousal/Alertness: Awake/alert Behavior During Therapy: WFL for tasks assessed/performed Overall Cognitive Status: Within Functional Limits for tasks assessed  Exercises      General Comments        Pertinent Vitals/Pain Pain Assessment: Faces Faces Pain Scale: Hurts little more Pain Location: back (chronic) Pain Descriptors / Indicators: Discomfort;Guarding;Sore Pain Intervention(s): Monitored during session    Home Living                      Prior Function            PT Goals (current goals can now be found in the care plan section) Acute Rehab PT Goals Patient Stated Goal: feel better PT Goal Formulation:  With patient Time For Goal Achievement: 11/21/19 Potential to Achieve Goals: Fair Progress towards PT goals: Progressing toward goals    Frequency    Min 3X/week      PT Plan Current plan remains appropriate    Co-evaluation              AM-PAC PT "6 Clicks" Mobility   Outcome Measure  Help needed turning from your back to your side while in a flat bed without using bedrails?: None Help needed moving from lying on your back to sitting on the side of a flat bed without using bedrails?: None Help needed moving to and from a bed to a chair (including a wheelchair)?: A Little Help needed standing up from a chair using your arms (e.g., wheelchair or bedside chair)?: None Help needed to walk in hospital room?: A Little Help needed climbing 3-5 steps with a railing? : A Lot 6 Click Score: 20    End of Session Equipment Utilized During Treatment: Gait belt Activity Tolerance: Patient tolerated treatment well Patient left: in bed;with call bell/phone within reach;with bed alarm set Nurse Communication: Mobility status PT Visit Diagnosis: Unsteadiness on feet (R26.81);Muscle weakness (generalized) (M62.81)     Time: 3664-4034 PT Time Calculation (min) (ACUTE ONLY): 20 min  Charges:  $Gait Training: 8-22 mins                     Roney Marion, [place]  Acute Rehabilitation Services Pager [phone] Office [phone]    Colletta [place] 11/12/2019, 9:28 AM

## 2019-11-12 NOTE — Progress Notes (Signed)
[facility] PROGRESS NOTE    Progress Note  Tom Bender  VEL:[id number] DOB: 07-28-1961 DOA: 10/22/2019 PCP: Patient, No Pcp Per     Brief Narrative:   Tom Bender is an 59 y.o. male past medical history significant for alcohol abuse ongoing, cirrhosis esophageal varices and history of DTs presents to the ED with hematochezia for 2 weeks prior to admission initially they were black on admission they were red.  She continues to consume daily alcohol.  In the ED she was given fresh frozen plasma packed red blood cell fluid resuscitated as she was hypotensive.  To the ICU gastroenterology was consulted to perform an EGD that showed nonbleeding Mallory-Weiss tear with no stigmata of bleeding.  Grade 1 esophageal varices in the lower third of the esophagus  Assessment/Plan:   Hypovolemic shock: Likely due to GI bleed see below for further details.  Bleed/hematemesis/melena/acute blood loss anemia/symptomatic anemia/  Mallory-Weiss tear: Patient is status post post 1 unit of fresh frozen plasma and 2 units of packed red blood cells on 10/23/2019 EGD was done that showed small esophageal varices without stigmata of bleeding, small Mallory-Weiss tear without stigmata of bleeding.  Possible small gastric varices and mild portal gastropathy.  She was initially treated with octreotide and IV PPI which of now been weaned off. Currently on PPI and Carafate no further nausea or vomiting or black stools. Has slowly trended up, this morning is 10.4.  Abdominal pain/distention: Likely secondary to above abdominal ultrasound showed cirrhosis with hepatic steatosis.  She is status post paracentesis on 10/29/2019 2.3 L, repeated again on 11/05/2019 yield 2 L. Attempted therapeutic paracentesis on 11/08/2019 however there was limited amount of fluid. KUB showed constipation she was started on lactulose.  Hepatic cirrhosis with ascites: Her blood pressure is borderline this morning, she continues to have  lower extremity edema, she has no recurrent accumulation of ascites fluid shown by ultrasound on 11/07/2018. Patient reports mild dizziness with standing and her heart rate increases. We will hold her Lasix and Aldactone today.  Hypomagnesemia/hypokalemia: Likely due to alcohol abuse of the were repleted orally now resolved.  Hypervolemic hyponatremia: He was started on Lasix and Aldactone and several paracentesis it has now resolved he will need to go home on Aldactone.  Deconditioning: Physical therapy has evaluated the patient and recommended skilled nursing facility.  Dental cavities: Follow-up with dentist as an outpatient.  Severe protein caloric malnutrition: Appreciate nutrition's input.   DVT prophylaxis: SCD Family Communication:none Disposition Plan/Barrier to D/C: He came from home will probably need to go to skilled nursing facility after his dizziness is resolved. Code Status:     Code Status Orders  (From admission, onward)         Start     Ordered   10/23/19 0222  Full code  Continuous     10/23/19 0223        Code Status History    Date Active Date Inactive Code Status Order ID Comments User Context   03/23/2019 2259 04/05/2019 1853 Full Code [id number]  Rometta Emery, MD Inpatient   Advance Care Planning Activity        IV Access:    Peripheral IV   Procedures and diagnostic studies:   No results found.   Medical Consultants:    None.  Anti-Infectives:   None  Subjective:    [name] relates dizziness upon standing or working with physical therapy.  He also has palpitations when he gets these episodes of lightheadedness.  Objective:    Vitals:   11/11/19 0754 11/11/19 1940 11/12/19 0500 11/12/19 0852  BP: 106/78 107/69 105/71 98/68  Pulse: 79 86 66 79  Resp:  17 18 16   Temp: 97.8 F (36.6 C) 99.3 F (37.4 C) 98.8 F (37.1 C) 98.4 F (36.9 C)  TempSrc: Oral Oral Oral Oral  SpO2: 99% 98% 98% 97%  Weight:        Height:       SpO2: 97 % O2 Flow Rate (L/min): 2 L/min   Intake/Output Summary (Last 24 hours) at 11/12/2019 0957 Last data filed at 11/12/2019 0900 Gross per 24 hour  Intake 240 ml  Output 1750 ml  Net -1510 ml   Filed Weights   10/22/19 1837 10/24/19 0400 10/24/19 0941  Weight: 72.6 kg 72.7 kg 72.7 kg    Exam: General exam: In no acute distress. Respiratory system: Good air movement and clear to auscultation. Cardiovascular system: S1 & S2 heard, RRR. No JVD. Gastrointestinal system: Abdomen is nondistended, soft and nontender.  Extremities: No pedal edema. Skin: Spider angiomas Psychiatry: Judgement and insight appear normal. Mood & affect appropriate. Data Reviewed:    Labs: Basic Metabolic Panel: Recent Labs  Lab 11/07/19 0236 11/07/19 0236 11/08/19 0224 11/08/19 0224 11/09/19 0543 11/12/19 0618  NA 135  --  138  --  139 137  K 3.5   < > 3.6   < > 3.7 3.8  CL 105  --  105  --  105 102  CO2 25  --  26  --  26 26  GLUCOSE 98  --  106*  --  91 93  BUN 6  --  6  --  5* 6  CREATININE 0.65  --  0.63  --  0.56* 0.62  CALCIUM 7.7*  --  8.1*  --  8.2* 8.3*  MG  --   --  1.5*  --  1.6* 1.7   < > = values in this interval not displayed.   GFR Estimated Creatinine Clearance: 103.1 mL/min (by C-G formula based on SCr of 0.62 mg/dL). Liver Function Tests: Recent Labs  Lab 11/08/19 0224 11/09/19 0543 11/12/19 0618  AST 70* 73* 75*  ALT 23 24 25   ALKPHOS 115 106 101  BILITOT 1.2 1.2 1.0  PROT 5.9* 5.9* 6.0*  ALBUMIN 1.8* 1.9* 2.0*   No results for input(s): LIPASE, AMYLASE in the last 168 hours. No results for input(s): AMMONIA in the last 168 hours. Coagulation profile No results for input(s): INR, PROTIME in the last 168 hours. COVID-19 Labs  No results for input(s): DDIMER, FERRITIN, LDH, CRP in the last 72 hours.  Lab Results  Component Value Date   SARSCOV2NAA NEGATIVE 10/23/2019   SARSCOV2NAA NOT DETECTED 03/24/2019    CBC: Recent Labs  Lab  11/07/19 0236 11/09/19 0543 11/12/19 0618  WBC 6.4 6.2 6.9  HGB 9.6* 10.5* 10.4*  HCT 30.1* 32.7* 32.7*  MCV 106.4* 105.1* 105.5*  PLT 161 170 176   Cardiac Enzymes: No results for input(s): CKTOTAL, CKMB, CKMBINDEX, TROPONINI in the last 168 hours. BNP (last 3 results) No results for input(s): PROBNP in the last 8760 hours. CBG: No results for input(s): GLUCAP in the last 168 hours. D-Dimer: No results for input(s): DDIMER in the last 72 hours. Hgb A1c: No results for input(s): HGBA1C in the last 72 hours. Lipid Profile: No results for input(s): CHOL, HDL, LDLCALC, TRIG, CHOLHDL, LDLDIRECT in the last 72 hours. Thyroid function studies: No results for  input(s): TSH, T4TOTAL, T3FREE, THYROIDAB in the last 72 hours.  Invalid input(s): FREET3 Anemia work up: No results for input(s): VITAMINB12, FOLATE, FERRITIN, TIBC, IRON, RETICCTPCT in the last 72 hours. Sepsis Labs: Recent Labs  Lab 11/07/19 0236 11/09/19 0543 11/12/19 0618  WBC 6.4 6.2 6.9   Microbiology No results found for this or any previous visit (from the past 240 hour(s)).   Medications:   . vitamin C  500 mg Oral BID  . Chlorhexidine Gluconate Cloth  6 each Topical Daily  . famotidine  20 mg Oral BID  . feeding supplement (ENSURE ENLIVE)  237 mL Oral TID BM  . folic acid  1 mg Oral Daily  . furosemide  20 mg Oral Q48H  . Gerhardt's butt cream   Topical TID  . lactulose  10 g Oral TID  . magnesium oxide  400 mg Oral BID  . mouth rinse  15 mL Mouth Rinse BID  . multivitamin with minerals  1 tablet Oral Daily  . pantoprazole  40 mg Oral BID AC  . spironolactone  50 mg Oral Q48H  . sucralfate  1 g Oral TID WC & HS  . thiamine  100 mg Oral Daily  . zinc sulfate  220 mg Oral Daily   Continuous Infusions:    LOS: 20 days   Charlynne Cousins  [facility]  11/12/2019, 9:57 AM

## 2019-11-13 NOTE — Plan of Care (Signed)
  Problem: Safety: Goal: Ability to remain free from injury will improve Outcome: Progressing   

## 2019-11-13 NOTE — Progress Notes (Signed)
Occupational Therapy Treatment Patient Details Name: Tom Bender MRN: [id number] DOB: [birth year] Today's Date: 11/13/2019    History of present illness Pt is 59 year old male with PMH significant for alcohol abuse that is continuous, liver cirrhosis with varices and DVTs.  Patient was admitted with hematochezia and dark blood tinged emesis.  Pt s/p EGD revealed small esophageal and query small proximal gastric varices with mild lower Weiss tear.    OT comments  Pt progressing well today with mobility and OOB ADL. Pt supervisionA to modified independent for light grooming and ADL at sink in standing. Pt ambulating 150' in hallway with hand held assist and no LOB. Pt took x1 standing rest break. Pt appears slightly shaky presumably due to detox. Pt reports "I really want to get out of here to go to the alcohol rehab center." OTR explaining that pt needs to be more independent with mobility to go there. Pt encouraged to ambulate in hallway daily with staff. OT to follow acutely.    Follow Up Recommendations  Supervision - Intermittent(Alcohol rehab program vs shelter)    Equipment Recommendations  None recommended by OT    Recommendations for Other Services      Precautions / Restrictions Precautions Precautions: Fall Restrictions Weight Bearing Restrictions: No       Mobility Bed Mobility Overal bed mobility: Modified Independent Bed Mobility: Supine to Sit;Sit to Supine     Supine to sit: Modified independent (Device/Increase time) Sit to supine: Modified independent (Device/Increase time)      Transfers Overall transfer level: Needs assistance   Transfers: Sit to/from Stand Sit to Stand: Supervision              Balance             Standing balance-Leahy Scale: Fair Standing balance comment: hand held assist or use of counter for stability                           ADL either performed or assessed with clinical judgement   ADL Overall ADL's  : Needs assistance/impaired     Grooming: Supervision/safety;Standing                   Toilet Transfer: Supervision/safety;RW   Toileting- Clothing Manipulation and Hygiene: Modified independent;Sitting/lateral lean;Sit to/from stand Toileting - Architect Details (indicate cue type and reason): Door ajar, but pt performing on his own     Functional mobility during ADLs: Supervision/safety;Cueing for safety General ADL Comments: Pt supervisionA to modified independent for light grooming and ADL at sink in standing. Pt ambulating 150' in hallway with hand held assist and no LOB. Pt took x1 standing rest break. Pt appears slightly shaky presumably due to detox.     Vision       Perception     Praxis      Cognition Arousal/Alertness: Awake/alert Behavior During Therapy: WFL for tasks assessed/performed Overall Cognitive Status: Within Functional Limits for tasks assessed                                          Exercises     Shoulder Instructions       General Comments Pt reports "I really want to get out of here to go to the alcohol rehab center." OTR explaining that pt needs to be more independent with mobility to go there.  Pt encouraged to ambulate in hallway daily with staff.    Pertinent Vitals/ Pain       Pain Assessment: Faces Faces Pain Scale: Hurts a little bit Pain Location: back (chronic) Pain Descriptors / Indicators: Discomfort Pain Intervention(s): Monitored during session  Home Living                                          Prior Functioning/Environment              Frequency  Min 2X/week        Progress Toward Goals  OT Goals(current goals can now be found in the care plan section)  Progress towards OT goals: Progressing toward goals  Acute Rehab OT Goals Patient Stated Goal: feel better OT Goal Formulation: With patient Time For Goal Achievement: 11/23/19 Potential to Achieve  Goals: Good ADL Goals Pt Will Perform Grooming: with modified independence;standing Pt Will Perform Upper Body Dressing: with modified independence;standing Pt Will Perform Lower Body Dressing: with modified independence;sitting/lateral leans;sit to/from stand Pt Will Transfer to Toilet: with modified independence;ambulating;regular height toilet Pt Will Perform Toileting - Clothing Manipulation and hygiene: with modified independence;sitting/lateral leans;sit to/from stand Additional ADL Goal #1: Pt will increase to modified independence for OOB ADL with good safety awareness.  Plan Discharge plan remains appropriate    Co-evaluation                 AM-PAC OT "6 Clicks" Daily Activity     Outcome Measure   Help from another person eating meals?: None Help from another person taking care of personal grooming?: None Help from another person toileting, which includes using toliet, bedpan, or urinal?: A Little Help from another person bathing (including washing, rinsing, drying)?: A Little Help from another person to put on and taking off regular upper body clothing?: None Help from another person to put on and taking off regular lower body clothing?: None 6 Click Score: 22    End of Session Equipment Utilized During Treatment: Gait belt  OT Visit Diagnosis: Muscle weakness (generalized) (M62.81);History of falling (Z91.81)   Activity Tolerance Patient tolerated treatment well   Patient Left in bed;with call bell/phone within reach   Nurse Communication Mobility status        Time: 1245-8099 OT Time Calculation (min): 25 min  Charges: OT General Charges $OT Visit: 1 Visit OT Treatments $Self Care/Home Management : 8-22 mins $Therapeutic Activity: 8-22 mins  Jefferey Pica OTR/L Acute Rehabilitation Services Pager: [phone] Office: [phone]    Camree Wigington C 11/13/2019, 3:40 PM

## 2019-11-13 NOTE — TOC Progression Note (Signed)
Transition of Care [facility]) - Progression Note    Patient Details  Name: Tom Bender MRN: [id number] Date of Birth: May 26, 1961  [facility] CM/SW Contact  Truddie Hidden, LCSW Phone Number: 11/13/2019, 4:12 PM  Clinical Narrative:     [facility] team has been working to find placement for patient. However due to ETOH history we have been unsuccessful in securing placement.   CSW informed patient of the above. Patient states that he has a son and a brother but he does not think that he is able to stay with them. CSW informed patient that he has progressed well enough to not need SNF. Patient states that he is aware that going to a shelter may be his only option. He states that he will talk to his brother tonight and follow up with CSW tomorrow.   TOC will continue to follow.   Expected Discharge Plan: Home/Self Care Barriers to Discharge: Inadequate or no insurance, Continued Medical Work up, SNF Pending bed offer  Expected Discharge Plan and Services Expected Discharge Plan: Home/Self Care In-house Referral: Clinical Social Work     Living arrangements for the past 2 months: Single Family Home                                       Social Determinants of Health (SDOH) Interventions    Readmission Risk Interventions No flowsheet data found.

## 2019-11-13 NOTE — Progress Notes (Signed)
[facility] PROGRESS NOTE    Progress Note  Assad Harbeson  [id number] DOB: 06/28/1961 DOA: 10/22/2019 PCP: Patient, No Pcp Per     Brief Narrative:   Zeki Bedrosian is an 59 y.o. male past medical history significant for alcohol abuse ongoing, cirrhosis esophageal varices and history of DTs presents to the ED with hematochezia for 2 weeks prior to admission initially they were black on admission they were red.  She continues to consume daily alcohol.  In the ED she was given fresh frozen plasma packed red blood cell fluid resuscitated as she was hypotensive.  To the ICU gastroenterology was consulted to perform an EGD that showed nonbleeding Mallory-Weiss tear with no stigmata of bleeding.  Grade 1 esophageal varices in the lower third of the esophagus  Assessment/Plan:   Hypovolemic shock: Likely due to GI bleed see below for further details. Due to his status of being uninsured we have had a hard time finding a skilled nursing facility placement for him.  Bleed/hematemesis/melena/acute blood loss anemia/symptomatic anemia/  Mallory-Weiss tear: Patient is status post post 1 unit of fresh frozen plasma and 2 units of packed red blood cells on 10/23/2019 EGD was done that showed small esophageal varices without stigmata of bleeding, small Mallory-Weiss tear without stigmata of bleeding.  Possible small gastric varices and mild portal gastropathy.  She was initially treated with octreotide and IV PPI which of now been weaned off. Currently on PPI and Carafate no further nausea or vomiting or black stools. Has slowly trended up, this morning is 10.4.  Abdominal pain/distention: Likely secondary to above abdominal ultrasound showed cirrhosis with hepatic steatosis.  She is status post paracentesis on 10/29/2019 2.3 L, repeated again on 11/05/2019 yield 2 L. Attempted therapeutic paracentesis on 11/08/2019 however there was limited amount of fluid. KUB showed constipation she was started on  lactulose.  Hepatic cirrhosis with ascites: Her blood pressure is borderline this morning, she continues to have lower extremity edema, she has no recurrent accumulation of ascites fluid shown by ultrasound on 11/07/2018. Patient reports mild dizziness with standing and her heart rate increases. We will hold her Lasix and Aldactone today.  Orthostatic hypotension: Likely due to overdiuresis.  We have held his diuretic therapy, after a recheck orthostatic vitals orthostasis has resolved, he relates his dizziness upon standing is improved. We will continue Aldactone daily and Lasix every other day.  Hypomagnesemia/hypokalemia: Likely due to alcohol abuse of the were repleted orally now resolved.  Hypervolemic hyponatremia: He was started on Lasix and Aldactone and several paracentesis it has now resolved he will need to go home on Aldactone.  Deconditioning: Physical therapy has evaluated the patient and recommended skilled nursing facility.  Dental cavities: Follow-up with dentist as an outpatient.  Severe protein caloric malnutrition: Appreciate nutrition's input.   DVT prophylaxis: SCD Family Communication:none Disposition Plan/Barrier to D/C: He came from home will probably need to go to skilled nursing facility after his dizziness is resolved.  He is uninsured and social workers been having a hard time finding placement for him, he is currently homeless. Code Status:     Code Status Orders  (From admission, onward)         Start     Ordered   10/23/19 0222  Full code  Continuous     10/23/19 0223        Code Status History    Date Active Date Inactive Code Status Order ID Comments User Context   03/23/2019 2259 04/05/2019 1853 Full  Code [id number]  Rometta Emery, MD Inpatient   Advance Care Planning Activity        IV Access:    Peripheral IV   Procedures and diagnostic studies:   No results found.   Medical Consultants:     None.  Anti-Infectives:   None  Subjective:    Hervey Adsit dizziness upon standing has resolved.  Objective:    Vitals:   11/12/19 1528 11/12/19 1957 11/13/19 0337 11/13/19 0731  BP: 101/74 112/79 103/62 100/68  Pulse: 67 92 81 79  Resp: 17 20 20 17   Temp: 98.2 F (36.8 C) 98.3 F (36.8 C) 98.5 F (36.9 C) (!) 97.5 F (36.4 C)  TempSrc: Oral Oral Oral Oral  SpO2: 97% 98% 97% 97%  Weight:      Height:       SpO2: 97 % O2 Flow Rate (L/min): 2 L/min   Intake/Output Summary (Last 24 hours) at 11/13/2019 0819 Last data filed at 11/13/2019 0500 Gross per 24 hour  Intake --  Output 2552 ml  Net -2552 ml   Filed Weights   10/22/19 1837 10/24/19 0400 10/24/19 0941  Weight: 72.6 kg 72.7 kg 72.7 kg    Exam: General exam: In no acute distress. Respiratory system: Good air movement and clear to auscultation. Cardiovascular system: S1 & S2 heard, RRR. No JVD. Gastrointestinal system: Abdomen is nondistended, soft and nontender.  Extremities: No pedal edema. Skin: Spider angiomas Psychiatry: Judgement and insight appear normal. Mood & affect appropriate. Data Reviewed:    Labs: Basic Metabolic Panel: Recent Labs  Lab 11/07/19 0236 11/07/19 0236 11/08/19 0224 11/08/19 0224 11/09/19 0543 11/12/19 0618  NA 135  --  138  --  139 137  K 3.5   < > 3.6   < > 3.7 3.8  CL 105  --  105  --  105 102  CO2 25  --  26  --  26 26  GLUCOSE 98  --  106*  --  91 93  BUN 6  --  6  --  5* 6  CREATININE 0.65  --  0.63  --  0.56* 0.62  CALCIUM 7.7*  --  8.1*  --  8.2* 8.3*  MG  --   --  1.5*  --  1.6* 1.7   < > = values in this interval not displayed.   GFR Estimated Creatinine Clearance: 103.1 mL/min (by C-G formula based on SCr of 0.62 mg/dL). Liver Function Tests: Recent Labs  Lab 11/08/19 0224 11/09/19 0543 11/12/19 0618  AST 70* 73* 75*  ALT 23 24 25   ALKPHOS 115 106 101  BILITOT 1.2 1.2 1.0  PROT 5.9* 5.9* 6.0*  ALBUMIN 1.8* 1.9* 2.0*   No results  for input(s): LIPASE, AMYLASE in the last 168 hours. No results for input(s): AMMONIA in the last 168 hours. Coagulation profile No results for input(s): INR, PROTIME in the last 168 hours. COVID-19 Labs  No results for input(s): DDIMER, FERRITIN, LDH, CRP in the last 72 hours.  Lab Results  Component Value Date   SARSCOV2NAA NEGATIVE 10/23/2019   SARSCOV2NAA NOT DETECTED 03/24/2019    CBC: Recent Labs  Lab 11/07/19 0236 11/09/19 0543 11/12/19 0618  WBC 6.4 6.2 6.9  HGB 9.6* 10.5* 10.4*  HCT 30.1* 32.7* 32.7*  MCV 106.4* 105.1* 105.5*  PLT 161 170 176   Cardiac Enzymes: No results for input(s): CKTOTAL, CKMB, CKMBINDEX, TROPONINI in the last 168 hours. BNP (last 3 results) No results for  input(s): PROBNP in the last 8760 hours. CBG: No results for input(s): GLUCAP in the last 168 hours. D-Dimer: No results for input(s): DDIMER in the last 72 hours. Hgb A1c: No results for input(s): HGBA1C in the last 72 hours. Lipid Profile: No results for input(s): CHOL, HDL, LDLCALC, TRIG, CHOLHDL, LDLDIRECT in the last 72 hours. Thyroid function studies: No results for input(s): TSH, T4TOTAL, T3FREE, THYROIDAB in the last 72 hours.  Invalid input(s): FREET3 Anemia work up: No results for input(s): VITAMINB12, FOLATE, FERRITIN, TIBC, IRON, RETICCTPCT in the last 72 hours. Sepsis Labs: Recent Labs  Lab 11/07/19 0236 11/09/19 0543 11/12/19 0618  WBC 6.4 6.2 6.9   Microbiology No results found for this or any previous visit (from the past 240 hour(s)).   Medications:   . vitamin C  500 mg Oral BID  . Chlorhexidine Gluconate Cloth  6 each Topical Daily  . famotidine  20 mg Oral BID  . feeding supplement (ENSURE ENLIVE)  237 mL Oral TID BM  . folic acid  1 mg Oral Daily  . furosemide  20 mg Oral Q48H  . Gerhardt's butt cream   Topical TID  . lactulose  10 g Oral TID  . magnesium oxide  400 mg Oral BID  . mouth rinse  15 mL Mouth Rinse BID  . multivitamin with minerals   1 tablet Oral Daily  . pantoprazole  40 mg Oral BID AC  . spironolactone  50 mg Oral Q48H  . sucralfate  1 g Oral TID WC & HS  . thiamine  100 mg Oral Daily  . zinc sulfate  220 mg Oral Daily   Continuous Infusions:    LOS: 21 days   Marinda Elk  [facility]  11/13/2019, 8:19 AM

## 2019-11-13 NOTE — Progress Notes (Signed)
Physical Therapy Treatment Patient Details Name: Tom Bender MRN: [id number] DOB: 09/29/1961 Today's Date: 11/13/2019    History of Present Illness Pt is 59 year old male with PMH significant for alcohol abuse that is continuous, liver cirrhosis with varices and DVTs.  Patient was admitted with hematochezia and dark blood tinged emesis.  Pt s/p EGD revealed small esophageal and query small proximal gastric varices with mild lower Weiss tear.     PT Comments    Patient seen for mobility progression. This session focused on gait training without use of AD. Pt continues to make progress toward PT goals and tolerated session well. Pt remains interested in going to alcohol rehab upon discharge if possible. PT will continue to follow and progress as tolerated.     Follow Up Recommendations  Home health PT;Supervision for mobility/OOB;Other (comment)(?shelter placement - house is unlivable, nowhere to go)     Equipment Recommendations  Rolling walker with 5" wheels;3in1 (PT)    Recommendations for Other Services       Precautions / Restrictions Precautions Precautions: Fall Restrictions Weight Bearing Restrictions: No    Mobility  Bed Mobility Overal bed mobility: Independent Bed Mobility: Supine to Sit;Sit to Supine     Supine to sit: Modified independent (Device/Increase time) Sit to supine: Modified independent (Device/Increase time)      Transfers Overall transfer level: Needs assistance Equipment used: None Transfers: Sit to/from Stand Sit to Stand: Supervision            Ambulation/Gait Ambulation/Gait assistance: Min guard Gait Distance (Feet): 240 Feet Assistive device: None Gait Pattern/deviations: Step-through pattern;Decreased stride length;Trunk flexed Gait velocity: decreased   General Gait Details: grossly min guard for safety without use of AD; pt with guarded movements however no LOB with horizontal head turns; cues for increased bilat step lengths  which pt able to demonstrate   Stairs             Wheelchair Mobility    Modified Rankin (Stroke Patients Only)       Balance     Sitting balance-Leahy Scale: Good       Standing balance-Leahy Scale: Fair Standing balance comment: hand held assist or use of counter for stability                            Cognition Arousal/Alertness: Awake/alert Behavior During Therapy: WFL for tasks assessed/performed Overall Cognitive Status: Within Functional Limits for tasks assessed                                        Exercises      General Comments General comments (skin integrity, edema, etc.): Pt reports "I really want to get out of here to go to the alcohol rehab center." OTR explaining that pt needs to be more independent with mobility to go there. Pt encouraged to ambulate in hallway daily with staff.      Pertinent Vitals/Pain Pain Assessment: Faces Faces Pain Scale: No hurt Pain Location: back (chronic) Pain Descriptors / Indicators: Discomfort Pain Intervention(s): Monitored during session    Home Living                      Prior Function            PT Goals (current goals can now be found in the care plan section) Acute Rehab PT  Goals Patient Stated Goal: feel better Progress towards PT goals: Progressing toward goals    Frequency    Min 3X/week      PT Plan Current plan remains appropriate    Co-evaluation              AM-PAC PT "6 Clicks" Mobility   Outcome Measure  Help needed turning from your back to your side while in a flat bed without using bedrails?: None Help needed moving from lying on your back to sitting on the side of a flat bed without using bedrails?: None Help needed moving to and from a bed to a chair (including a wheelchair)?: A Little Help needed standing up from a chair using your arms (e.g., wheelchair or bedside chair)?: None Help needed to walk in hospital room?: A  Little Help needed climbing 3-5 steps with a railing? : A Little 6 Click Score: 21    End of Session Equipment Utilized During Treatment: Gait belt Activity Tolerance: Patient tolerated treatment well Patient left: in bed;with call bell/phone within reach;with nursing/sitter in room Nurse Communication: Mobility status PT Visit Diagnosis: Unsteadiness on feet (R26.81);Muscle weakness (generalized) (M62.81)     Time: 0932-6712 PT Time Calculation (min) (ACUTE ONLY): 17 min  Charges:  $Gait Training: 8-22 mins                     Erline Levine, PTA Acute Rehabilitation Services Pager: [phone] Office: [phone]     Carolynne Edouard 11/13/2019, 4:35 PM

## 2019-11-13 NOTE — Progress Notes (Signed)
Nutrition Follow-up  DOCUMENTATION CODES:   Severe malnutrition in context of chronic illness  INTERVENTION:   -Continue 500 mg vitamin C BID -Continue 220 mg zinc sulfate daily -Continue MVI with minerals daily -Continue Magic cup BID with meals, each supplement provides 290 kcal and 9 grams of protein -Continue Ensure Enlive po TID, each supplement provides 350 kcal and 20 grams of protein -Downgrade diet to dysphagia 3 (advanced mechanical soft) for ease of intake   NUTRITION DIAGNOSIS:   Severe Malnutrition related to chronic illness(cirrhosis) as evidenced by severe fat depletion, severe muscle depletion.  Ongoing  GOAL:   Patient will meet greater than or equal to 90% of their needs  Progressing   MONITOR:   PO intake, Supplement acceptance, Labs  REASON FOR ASSESSMENT:   Malnutrition Screening Tool    ASSESSMENT:   Pt with PMH of ongoing ETOH abuse (40-80 oz beer daily), liver cirrhosis with varices and DVTs who lives with sister per review pt has had 2-3 weeks of weakness, falls, and rectal bleeding.  1/7 EGD reveals small esophageal and small proximal gastric varices with tear 1/19- s/p lt paracentesis (2.5 L hazy yellow fluid removed) 1/21- per IR, trace amount of reaccumulated fluid not amenable to paracentesis   Reviewed I/O's: -2.6 L x 24 hours and -18.6 L since 10/30/19  UOP: 2.6 L x 24 hours   Attempted to speak with pt via phone, however, no answer.  Intake has improved since last week. Noted meal completion now 100%. Pt is accepting most Ensure supplements per [facility], however, pt refused this afternoon's dose.   Medications reviewed and include folic acid, lasix, MVI, vitamin C, and thiamine.   CSW working on SNF placement, however, pt is difficult to place secondary to homelessness and lack of insurance.  Labs reviewed: Mg: 1.6. K WDL.   Diet Order:   Diet Order            DIET DYS 3 Room service appropriate? Yes with Assist; Fluid  consistency: Thin  Diet effective now              EDUCATION NEEDS:   Education needs have been addressed  Skin:  Skin Assessment: Skin Integrity Issues: Skin Integrity Issues:: Stage I, Other (Comment)(MASD: perineum, buttocks) Stage I: - Other: MASD groin and perineum, open wound to buttocks  Last BM:  11/12/19  Height:   Ht Readings from Last 1 Encounters:  10/24/19 5\' 9"  (1.753 m)    Weight:   Wt Readings from Last 1 Encounters:  10/24/19 72.7 kg    Ideal Body Weight:  72.7 kg  BMI:  Body mass index is 23.67 kg/m.  Estimated Nutritional Needs:   Kcal:  2200-2400  Protein:  100-125 grams  Fluid:  2 L/day    Xavian Hardcastle A. 12/22/19, RD, LDN, CDCES Registered Dietitian II Certified Diabetes Care and Education Specialist Pager: [phone] After hours Pager: [phone]

## 2019-11-14 LAB — CBC
HCT: 34.5 % — ABNORMAL LOW (ref 39.0–52.0)
Hemoglobin: 11.1 g/dL — ABNORMAL LOW (ref 13.0–17.0)
MCH: 32.9 pg (ref 26.0–34.0)
MCHC: 32.2 g/dL (ref 30.0–36.0)
MCV: 102.4 fL — ABNORMAL HIGH (ref 80.0–100.0)
Platelets: 170 10*3/uL (ref 150–400)
RBC: 3.37 MIL/uL — ABNORMAL LOW (ref 4.22–5.81)
RDW: 17.3 % — ABNORMAL HIGH (ref 11.5–15.5)
WBC: 6.7 10*3/uL (ref 4.0–10.5)
nRBC: 0 % (ref 0.0–0.2)

## 2019-11-14 LAB — COMPREHENSIVE METABOLIC PANEL
ALT: 27 U/L (ref 0–44)
AST: 81 U/L — ABNORMAL HIGH (ref 15–41)
Albumin: 2.1 g/dL — ABNORMAL LOW (ref 3.5–5.0)
Alkaline Phosphatase: 105 U/L (ref 38–126)
Anion gap: 7 (ref 5–15)
BUN: <5 mg/dL — ABNORMAL LOW (ref 6–20)
CO2: 25 mmol/L (ref 22–32)
Calcium: 8.5 mg/dL — ABNORMAL LOW (ref 8.9–10.3)
Chloride: 105 mmol/L (ref 98–111)
Creatinine, Ser: 0.56 mg/dL — ABNORMAL LOW (ref 0.61–1.24)
GFR calc Af Amer: >60 mL/min (ref >60)
GFR calc non Af Amer: >60 mL/min (ref >60)
Glucose, Bld: 92 mg/dL (ref 70–99)
Potassium: 3.8 mmol/L (ref 3.5–5.1)
Sodium: 137 mmol/L (ref 135–145)
Total Bilirubin: 1.2 mg/dL (ref 0.3–1.2)
Total Protein: 6.4 g/dL — ABNORMAL LOW (ref 6.5–8.1)

## 2019-11-14 LAB — MAGNESIUM: Magnesium: 1.7 mg/dL (ref 1.7–2.4)

## 2019-11-14 MED ORDER — FUROSEMIDE 20 MG PO TABS: 10.0000 mg | ORAL_TABLET | ORAL | Status: DC

## 2019-11-14 MED ORDER — SPIRONOLACTONE 25 MG PO TABS: 25.0000 mg | ORAL_TABLET | ORAL | Status: DC

## 2019-11-14 NOTE — Progress Notes (Signed)
Physical Therapy Treatment Patient Details Name: Tom Bender MRN: [id number] DOB: 1960/11/10 Today's Date: 11/14/2019    History of Present Illness Pt is 59 year old male with PMH significant for alcohol abuse that is continuous, liver cirrhosis with varices and DVTs.  Patient was admitted with hematochezia and dark blood tinged emesis.  Pt s/p EGD revealed small esophageal and query small proximal gastric varices with mild lower Weiss tear.     PT Comments    Continuing work on functional mobility and activity tolerance;  Making steady progress with prgressive amb, and was able to go up and down a full flight of stairs using a rail without difficulty; No need for HHPT follow up -- could benefit form Outpt PT for balance and smooth out gait, but it doesn't look like that is an option, given insurance;   He voiced understanding that his options are to go to a shelter, or to his brother's place;      Follow Up Recommendations  Outpatient PT;Other (comment) Needs a Primary Care Physican     Equipment Recommendations  None recommended by PT(walking well enough without RW)    Recommendations for Other Services       Precautions / Restrictions Precautions Precautions: Fall Precaution Comments: Fall risk much better    Mobility  Bed Mobility Overal bed mobility: Independent                Transfers     Transfers: Sit to/from Stand Sit to Stand: Supervision         General transfer comment: No difficulty noted  Ambulation/Gait Ambulation/Gait assistance: Min guard(without physical contact) Gait Distance (Feet): 500 Feet Assistive device: None(occasional use of hallway rail) Gait Pattern/deviations: Step-through pattern Gait velocity: decreased   General Gait Details: No loss of balance noted today; stiff steps at times and at times reachingout for hallway rail   Stairs Stairs: Yes Stairs assistance: Min guard Stair Management: One rail  Right;Forwards;Alternating pattern Number of Stairs: 12 General stair comments: Took the steps slowly; Good use of rails for stability   Wheelchair Mobility    Modified Rankin (Stroke Patients Only)       Balance             Standing balance-Leahy Scale: (approaching Good)                              Cognition Arousal/Alertness: Awake/alert Behavior During Therapy: WFL for tasks assessed/performed Overall Cognitive Status: Within Functional Limits for tasks assessed                                 General Comments: Reporting frustration with his dc options      Exercises      General Comments General comments (skin integrity, edema, etc.): We discussed the improtance of getting a Primary Care Physician      Pertinent Vitals/Pain Pain Assessment: Faces Faces Pain Scale: No hurt Pain Location: back (chronic) Pain Intervention(s): Monitored during session    Home Living                      Prior Function            PT Goals (current goals can now be found in the care plan section) Acute Rehab PT Goals Patient Stated Goal: Did not state PT Goal Formulation: With patient Time For Goal Achievement:  11/21/19 Potential to Achieve Goals: Fair Progress towards PT goals: Progressing toward goals(Will likely meet PT goals next session)    Frequency    Min 3X/week      PT Plan Discharge plan needs to be updated;Equipment recommendations need to be updated    Co-evaluation              AM-PAC PT "6 Clicks" Mobility   Outcome Measure  Help needed turning from your back to your side while in a flat bed without using bedrails?: None Help needed moving from lying on your back to sitting on the side of a flat bed without using bedrails?: None Help needed moving to and from a bed to a chair (including a wheelchair)?: None Help needed standing up from a chair using your arms (e.g., wheelchair or bedside chair)?:  None Help needed to walk in hospital room?: None Help needed climbing 3-5 steps with a railing? : A Little 6 Click Score: 23    End of Session Equipment Utilized During Treatment: Gait belt Activity Tolerance: Patient tolerated treatment well Patient left: in bed;with call bell/phone within reach;with nursing/sitter in room Nurse Communication: Mobility status PT Visit Diagnosis: Unsteadiness on feet (R26.81);Muscle weakness (generalized) (M62.81)     Time: 1610-9604 PT Time Calculation (min) (ACUTE ONLY): 18 min  Charges:  $Gait Training: 8-22 mins                     Van Clines, [place]  Acute Rehabilitation Services Pager [phone] Office [phone]    Tom Bender 11/14/2019, 3:24 PM

## 2019-11-14 NOTE — Progress Notes (Addendum)
[facility] PROGRESS NOTE    Progress Note  Tom Bender  TDV:[id number] DOB: 1961-06-13 DOA: 10/22/2019 PCP: Patient, No Pcp Per     Brief Narrative:   Tom Bender is an 59 y.o. male with a past medical history significant for alcohol abuse ongoing, cirrhosis, esophageal varices and history of DTs who presented to the ED with hematochezia/melena for 2 weeks prior to admission as well as hemoptysis. Prior to admission, consuming daily alcohol.  In the ED he was given fresh frozen plasma, packed red blood cells and fluid resuscitated as he was hypotensive. In the ICU gastroenterology was consulted to perform an EGD that showed nonbleeding Mallory-Weiss tear with no stigmata or bleeding. Grade 1 esophageal varices in the lower third of the esophagus. Patient now stable and situation complicated by placement as he is homeless.  Assessment/Plan:   Hypovolemic shock: (resolved) Likely due to GI bleed  Due to his status of being uninsured/homeless, difficult to find placement   Bleed/hematemesis/melena/acute blood loss anemia/symptomatic anemia/  Mallory-Weiss tear: (resolved) Patient is status post post 1 unit of fresh frozen plasma and 2 units of packed red blood cells on 10/23/2019 EGD was done that showed small esophageal varices without stigmata of bleeding, small Mallory-Weiss tear without stigmata of bleeding.  Possible small gastric varices and mild portal gastropathy.  She was initially treated with octreotide and IV PPI which of now been weaned off. Currently on PPI and Carafate no further nausea or vomiting or black stools. Hgb stable; now 11.4  Abdominal pain/distention: Likely secondary to above abdominal ultrasound which showed cirrhosis with hepatic steatosis. He is status post paracentesis on 10/29/2019 2.3 L, repeated again on 11/05/2019 yield 2 L. Attempted therapeutic paracentesis on 11/08/2019 however there was limited amount of fluid. KUB showed constipation and he was  started on lactulose.  Hepatic cirrhosis with ascites: No recurrent accumulation of ascites fluid shown by ultrasound on 11/07/2018.  Orthostatic hypotension: Likely due to overdiuresis.  We will continue Aldactone daily and Lasix every other day; doses decreased to half on 1/27. Now on Lasix 10 mg q48h and Aldactone 25 mg q 48h. BP's low-normal and continues to have some dizziness. No LE edema. BP Readings from Last 1 Encounters:  11/14/19 110/82   Hypomagnesemia/hypokalemia: Likely due to alcohol abuse of the were repleted orally now resolved. Cont Mag Ox  Hypervolemic hyponatremia: He was started on Lasix and Aldactone and has had several paracentesis Will likely need to go home on Aldactone/Lasix combo.  Deconditioning: Physical therapy has evaluated the patient and recommended skilled nursing facility which is complicated by homelessness and uninsured state   Dental cavities: Follow-up with dentist as an outpatient.  Severe protein caloric malnutrition: Appreciate nutrition's input.  DVT prophylaxis: SCD Family Communication: none Disposition Plan/Barrier to D/C: He came from home and ideally will need to go to skilled nursing facility. He is uninsured and social workers been having a hard time finding placement for him, he is currently homeless. SW working on charity home health. Plan for DC to shelter 1/28.  Code Status:     Code Status Orders  (From admission, onward)         Start     Ordered   10/23/19 0222  Full code  Continuous     10/23/19 0223        Code Status History    Date Active Date Inactive Code Status Order ID Comments User Context   03/23/2019 2259 04/05/2019 1853 Full Code [id number]  Earlie Lou  L, MD Inpatient   Advance Care Planning Activity        IV Access:    Peripheral IV   Procedures and diagnostic studies:   No results found.   Medical Consultants:    None.  Anti-Infectives:   None  Subjective:    Tom Bender reports minimal dizziness now. Reports trouble walking on his own and worried about walking on uneven ground outside as he is having trouble here. Reports he is talking to his brothers about possible options for housing. His sister's house is an option, she recently passed away, but sounds like house is unlivable at this time. Reports some abdominal pain that he has had throughout stay.  Objective:    Vitals:   11/13/19 1951 11/14/19 0357 11/14/19 0827 11/14/19 1243  BP: 98/63 91/63 106/75 110/82  Pulse: 82 70 90 (!) 107  Resp: 17 16    Temp: 97.8 F (36.6 C) 98.3 F (36.8 C) 97.9 F (36.6 C) 98.6 F (37 C)  TempSrc: Oral Oral Oral Oral  SpO2: 98% 97% 97% 97%  Weight:      Height:       SpO2: 97 % O2 Flow Rate (L/min): 2 L/min  No intake or output data in the 24 hours ending 11/14/19 1555 Filed Weights   10/22/19 1837 10/24/19 0400 10/24/19 0941  Weight: 72.6 kg 72.7 kg 72.7 kg    Exam: General exam: In no acute distress. Resting in bed.  Respiratory system: Good air movement and clear to auscultation. Cardiovascular system: RRR, no MGR Gastrointestinal system: Abdomen is nondistended, soft and nontender.  Extremities: No pedal edema. Skin: Spider angiomas Psychiatry: Judgement and insight appear normal. Mood & affect appropriate. Data Reviewed:    Labs: Basic Metabolic Panel: Recent Labs  Lab 11/08/19 0224 11/08/19 0224 11/09/19 0543 11/09/19 0543 11/12/19 0618 11/14/19 0600  NA 138  --  139  --  137 137  K 3.6   < > 3.7   < > 3.8 3.8  CL 105  --  105  --  102 105  CO2 26  --  26  --  26 25  GLUCOSE 106*  --  91  --  93 92  BUN 6  --  5*  --  6 <5*  CREATININE 0.63  --  0.56*  --  0.62 0.56*  CALCIUM 8.1*  --  8.2*  --  8.3* 8.5*  MG 1.5*  --  1.6*  --  1.7 1.7   < > = values in this interval not displayed.   GFR Estimated Creatinine Clearance: 103.1 mL/min (A) (by C-G formula based on SCr of 0.56 mg/dL (L)). Liver Function Tests: Recent Labs    Lab 11/08/19 0224 11/09/19 0543 11/12/19 0618 11/14/19 0600  AST 70* 73* 75* 81*  ALT 23 24 25 27   ALKPHOS 115 106 101 105  BILITOT 1.2 1.2 1.0 1.2  PROT 5.9* 5.9* 6.0* 6.4*  ALBUMIN 1.8* 1.9* 2.0* 2.1*   No results for input(s): LIPASE, AMYLASE in the last 168 hours. No results for input(s): AMMONIA in the last 168 hours. Coagulation profile No results for input(s): INR, PROTIME in the last 168 hours. COVID-19 Labs  No results for input(s): DDIMER, FERRITIN, LDH, CRP in the last 72 hours.  Lab Results  Component Value Date   [place] NEGATIVE 10/23/2019   SARSCOV2NAA NOT DETECTED 03/24/2019    CBC: Recent Labs  Lab 11/09/19 0543 11/12/19 0618 11/14/19 0600  WBC 6.2 6.9 6.7  HGB 10.5* 10.4* 11.1*  HCT 32.7* 32.7* 34.5*  MCV 105.1* 105.5* 102.4*  PLT 170 176 170   Cardiac Enzymes: No results for input(s): CKTOTAL, CKMB, CKMBINDEX, TROPONINI in the last 168 hours. BNP (last 3 results) No results for input(s): PROBNP in the last 8760 hours. CBG: No results for input(s): GLUCAP in the last 168 hours. D-Dimer: No results for input(s): DDIMER in the last 72 hours. Hgb A1c: No results for input(s): HGBA1C in the last 72 hours. Lipid Profile: No results for input(s): CHOL, HDL, LDLCALC, TRIG, CHOLHDL, LDLDIRECT in the last 72 hours. Thyroid function studies: No results for input(s): TSH, T4TOTAL, T3FREE, THYROIDAB in the last 72 hours.  Invalid input(s): FREET3 Anemia work up: No results for input(s): VITAMINB12, FOLATE, FERRITIN, TIBC, IRON, RETICCTPCT in the last 72 hours. Sepsis Labs: Recent Labs  Lab 11/09/19 0543 11/12/19 0618 11/14/19 0600  WBC 6.2 6.9 6.7   Microbiology No results found for this or any previous visit (from the past 240 hour(s)).   Medications:   . vitamin C  500 mg Oral BID  . Chlorhexidine Gluconate Cloth  6 each Topical Daily  . famotidine  20 mg Oral BID  . feeding supplement (ENSURE ENLIVE)  237 mL Oral TID BM  . folic  acid  1 mg Oral Daily  . [START ON 11/16/2019] furosemide  10 mg Oral Q48H  . Gerhardt's butt cream   Topical TID  . lactulose  10 g Oral TID  . magnesium oxide  400 mg Oral BID  . mouth rinse  15 mL Mouth Rinse BID  . multivitamin with minerals  1 tablet Oral Daily  . pantoprazole  40 mg Oral BID AC  . [START ON 11/16/2019] spironolactone  25 mg Oral Q48H  . sucralfate  1 g Oral TID WC & HS  . thiamine  100 mg Oral Daily  . zinc sulfate  220 mg Oral Daily   Continuous Infusions:    LOS: 22 days   Joselyn Arrow, MD  [facility]  11/14/2019, 3:55 PM

## 2019-11-14 NOTE — Clinical Social Work Note (Signed)
Per most up to date PT note patient is ambulating well enough without any DME. Able to be discharged to brother's home or shelter.   CSW followed with patient and he states that he was not able to get in contact with his daughter.   CSW to follow up with MD for next step.

## 2019-11-15 MED ORDER — FLUOXETINE HCL 20 MG PO CAPS: 20.0000 mg | ORAL_CAPSULE | Freq: Every day | ORAL | 0 refills | Status: DC

## 2019-11-15 MED ORDER — PANTOPRAZOLE SODIUM 40 MG PO TBEC: 40.0000 mg | DELAYED_RELEASE_TABLET | Freq: Two times a day (BID) | ORAL | 0 refills | Status: DC

## 2019-11-15 MED ORDER — LACTULOSE 10 GM/15ML PO SOLN: 10.0000 g | Freq: Three times a day (TID) | ORAL | 0 refills | Status: AC

## 2019-11-15 MED ORDER — THIAMINE HCL 100 MG PO TABS: 100.0000 mg | ORAL_TABLET | Freq: Every day | ORAL | 0 refills | Status: AC

## 2019-11-15 MED ORDER — FUROSEMIDE 20 MG PO TABS: 10.0000 mg | ORAL_TABLET | ORAL | 0 refills | Status: DC

## 2019-11-15 MED ORDER — FOLIC ACID 1 MG PO TABS: 1.0000 mg | ORAL_TABLET | Freq: Every day | ORAL | 0 refills | Status: AC

## 2019-11-15 MED ORDER — METHOCARBAMOL 500 MG PO TABS: 500.0000 mg | ORAL_TABLET | Freq: Three times a day (TID) | ORAL | 0 refills | Status: DC | PRN

## 2019-11-15 MED ORDER — SPIRONOLACTONE 25 MG PO TABS: 25.0000 mg | ORAL_TABLET | ORAL | 0 refills | Status: DC

## 2019-11-15 MED ORDER — ADULT MULTIVITAMIN W/MINERALS CH: 1.0000 | ORAL_TABLET | Freq: Every day | ORAL | 0 refills | Status: AC

## 2019-11-15 MED ORDER — SUCRALFATE 1 GM/10ML PO SUSP: 1.0000 g | Freq: Three times a day (TID) | ORAL | 0 refills | Status: AC

## 2019-11-15 MED FILL — FUROSEMIDE 20 MG TAB: 20 | 30 days supply | Qty: 15 | Fill #0

## 2019-11-15 MED FILL — SPIRONOLACTONE 25 MG TABLET: 25 | 30 days supply | Qty: 15 | Fill #0

## 2019-11-15 MED FILL — VITAMIN B-1 100 MG TABS: 100 | 30 days supply | Qty: 30 | Fill #0

## 2019-11-15 MED FILL — FLUoxetine HCL 20 MG CAPS: 20 | 30 days supply | Qty: 30 | Fill #0

## 2019-11-15 MED FILL — SUCRALFATE 1 GM/10ML SUSP: 1 | 11 days supply | Qty: 420 | Fill #0

## 2019-11-15 MED FILL — PANTOPRAZOLE SOD DR 40 MG T: 40 | 30 days supply | Qty: 60 | Fill #0

## 2019-11-15 MED FILL — FOLIC ACID 1 MG TABS: 1 | 30 days supply | Qty: 30 | Fill #0

## 2019-11-15 MED FILL — METHOCARBAMOL 500 MG TABS: 500 | 10 days supply | Qty: 30 | Fill #0

## 2019-11-15 MED FILL — LACTULOSE 10 GM/15 ML SOLN: 10 | 30 days supply | Qty: 1419 | Fill #0

## 2019-11-15 NOTE — Progress Notes (Signed)
Physical Therapy Treatment Patient Details Name: Tom Bender MRN: [id number] DOB: Mar 02, 1961 Today's Date: 11/15/2019    History of Present Illness Pt is 59 year old male with PMH significant for alcohol abuse that is continuous, liver cirrhosis with varices and DVTs.  Patient was admitted with hematochezia and dark blood tinged emesis.  Pt s/p EGD revealed small esophageal and query small proximal gastric varices with mild lower Weiss tear.     PT Comments    Pt continuing to make steady progress overall with mobility. Pt reporting today that he may be able to stay with his aunt and uncle in their basement temporarily upon d/c. Pt not very happy about the idea of going to a shelter. Pt would continue to benefit from skilled physical therapy services at this time while admitted and after d/c to address the below listed limitations in order to improve overall safety and independence with functional mobility.    Follow Up Recommendations  Outpatient PT     Equipment Recommendations  None recommended by PT    Recommendations for Other Services       Precautions / Restrictions Precautions Precautions: Fall Restrictions Weight Bearing Restrictions: No    Mobility  Bed Mobility Overal bed mobility: Independent                Transfers Overall transfer level: Needs assistance Equipment used: None Transfers: Sit to/from Stand Sit to Stand: Supervision         General transfer comment: supervision for safety  Ambulation/Gait Ambulation/Gait assistance: Min guard Gait Distance (Feet): 500 Feet Assistive device: None Gait Pattern/deviations: Step-through pattern;Decreased stride length Gait velocity: decreased   General Gait Details: pt overall with slow, cautious gait; no LOB or need for physical assistance   Stairs             Wheelchair Mobility    Modified Rankin (Stroke Patients Only)       Balance Overall balance assessment: Needs  assistance Sitting-balance support: Feet supported Sitting balance-Leahy Scale: Good     Standing balance support: During functional activity Standing balance-Leahy Scale: Fair                              Cognition Arousal/Alertness: Awake/alert Behavior During Therapy: WFL for tasks assessed/performed Overall Cognitive Status: Within Functional Limits for tasks assessed                                        Exercises      General Comments        Pertinent Vitals/Pain Pain Assessment: No/denies pain    Home Living                      Prior Function            PT Goals (current goals can now be found in the care plan section) Acute Rehab PT Goals PT Goal Formulation: With patient Time For Goal Achievement: 11/21/19 Potential to Achieve Goals: Fair Progress towards PT goals: Progressing toward goals    Frequency    Min 3X/week      PT Plan Current plan remains appropriate    Co-evaluation              AM-PAC PT "6 Clicks" Mobility   Outcome Measure  Help needed turning from your back to your side  while in a flat bed without using bedrails?: None Help needed moving from lying on your back to sitting on the side of a flat bed without using bedrails?: None Help needed moving to and from a bed to a chair (including a wheelchair)?: None Help needed standing up from a chair using your arms (e.g., wheelchair or bedside chair)?: None Help needed to walk in hospital room?: None Help needed climbing 3-5 steps with a railing? : A Little 6 Click Score: 23    End of Session Equipment Utilized During Treatment: Gait belt Activity Tolerance: Patient tolerated treatment well Patient left: in bed;with call bell/phone within reach Nurse Communication: Mobility status PT Visit Diagnosis: Unsteadiness on feet (R26.81);Muscle weakness (generalized) (M62.81)     Time: 2707-8675 PT Time Calculation (min) (ACUTE ONLY): 18  min  Charges:  $Gait Training: 8-22 mins                     Arletta Bale, DPT  Acute Rehabilitation Services Pager [phone] Office [phone]     Alessandra Bevels Jeramey Lanuza 11/15/2019, 11:14 AM

## 2019-11-15 NOTE — Discharge Summary (Signed)
Physician Discharge Summary  Tom Bender PJK:[id number] DOB: 1961/06/03 DOA: 10/22/2019  PCP: Patient, No Pcp Per  Admit date: 10/22/2019 Discharge date: 11/15/2019  Admitted From: Homeless Disposition:  He will stay with a cousin for at least 2 weeks  Recommendations for Outpatient Follow-up:  1. Follow up with PCP in 1-2 weeks 2. Please obtain BMP/CBC in one week  Home Health:None Equipment/Devices: None  Discharge Condition:Stable CODE STATUS: Stable Diet recommendation: Heart Healthy   Brief/Interim Summary: Tom Bender is an 59 y.o. male with a past medical history significant for alcohol abuse ongoing, cirrhosis, esophageal varices and history of DTs who presented to the ED with hematochezia/melena for 2 weeks prior to admission as well as hemoptysis. Prior to admission, consuming daily alcohol.  In the ED he was given fresh frozen plasma, packed red blood cells and fluid resuscitated as he was hypotensive. In the ICU gastroenterology was consulted to perform an EGD that showed nonbleeding Mallory-Weiss tear with no stigmata or bleeding. Grade 1 esophageal varices in the lower third of the esophagus. Patient now stable and situation complicated by placement as he is homeless.  Discharge Diagnoses:  Principal Problem:   GI bleed Active Problems:   Hyponatremia   Benign essential HTN   Alcohol abuse   Alcoholic hepatitis   Sinus tachycardia   Pressure injury of skin   Mallory-Weiss tear   Protein-calorie malnutrition, severe  Hypovolemic shock: (resolved) Likely due to GI bleed  Due to his status of being uninsured/homeless, difficult to find placement   Bleed/hematemesis/melena/acute blood loss anemia/symptomatic anemia/  Mallory-Weiss tear: (resolved) Patient is status post post 1 unit of fresh frozen plasma and 2 units of packed red blood cells on 10/23/2019 EGD was done that showed small esophageal varices without stigmata of bleeding, small Mallory-Weiss tear without  stigmata of bleeding.  Possible small gastric varices and mild portal gastropathy.  She was initially treated with octreotide and IV PPI which of now been weaned off. Currently on PPI and Carafate no further nausea or vomiting or black stools. Hgb stable; now 11.4  Abdominal pain/distention: Likely secondary to above abdominal ultrasound which showed cirrhosis with hepatic steatosis. He is status post paracentesis on 10/29/2019 2.3 L, repeated again on 11/05/2019 yield 2 L. Attempted therapeutic paracentesis on 11/08/2019 however there was limited amount of fluid. KUB showed constipation and he was started on lactulose. His constipation resolved.   Hepatic cirrhosis with ascites: No recurrent accumulation of ascites fluid shown by ultrasound on 11/07/2018.  Orthostatic hypotension: Likely due to overdiuresis.  We will continue Aldactone daily and Lasix every other day; doses decreased to half on 1/27. Now on Lasix 10 mg q48h and Aldactone 25 mg q 48h. BP's low-normal and continues to have some dizziness. No LE edema.  Hypomagnesemia/hypokalemia: Likely due to alcohol abuse of the were repleted orally now resolved.   Hypervolemic hyponatremia: He was started on Lasix and Aldactone and has had several paracentesis Will  go home on Aldactone/Lasix combo.  Deconditioning: Physical therapy has evaluated the patient and recommended skilled nursing facility initially but he improved. May need outpatient OT but he is not sure he will be able to afford this as he is uninsured.    Dental cavities: Follow-up with dentist as an outpatient.  Severe protein caloric malnutrition: Appreciate nutrition's input. He will continue daily MV on discharge. His appetite improved.     Discharge Instructions  Discharge Instructions    Diet - low sodium heart healthy   Complete by: As directed  Increase activity slowly   Complete by: As directed      Allergies as of 11/15/2019   No Known  Allergies     Medication List    STOP taking these medications   magnesium oxide 400 (241.3 Mg) MG tablet Commonly known as: MAG-OX   potassium chloride SA 20 MEQ tablet Commonly known as: KLOR-CON     TAKE these medications   FLUoxetine 20 MG capsule Commonly known as: PROZAC Take 1 capsule (20 mg total) by mouth daily.   folic acid 1 MG tablet Commonly known as: FOLVITE Take 1 tablet (1 mg total) by mouth daily. Start taking on: November 16, 2019 What changed: how much to take   furosemide 20 MG tablet Commonly known as: LASIX Take 0.5 tablets (10 mg total) by mouth every other day. Start taking on: November 16, 2019 What changed:   how much to take  when to take this   lactulose 10 GM/15ML solution Commonly known as: CHRONULAC Take 15 mLs (10 g total) by mouth 3 (three) times daily.   methocarbamol 500 MG tablet Commonly known as: ROBAXIN Take 1 tablet (500 mg total) by mouth every 8 (eight) hours as needed for muscle spasms.   multivitamin with minerals Tabs tablet Take 1 tablet by mouth daily. Start taking on: November 16, 2019   pantoprazole 40 MG tablet Commonly known as: PROTONIX Take 1 tablet (40 mg total) by mouth 2 (two) times daily before a meal. What changed: when to take this   spironolactone 25 MG tablet Commonly known as: ALDACTONE Take 1 tablet (25 mg total) by mouth every other day. Start taking on: November 16, 2019 What changed:   how much to take  when to take this   sucralfate 1 GM/10ML suspension Commonly known as: CARAFATE Take 10 mLs (1 g total) by mouth 4 (four) times daily -  with meals and at bedtime.   thiamine 100 MG tablet Take 1 tablet (100 mg total) by mouth daily.      Follow-up Information    Rolan Lipa, MD. Schedule an appointment as soon as possible for a visit.   Specialty: Gastroenterology Why: for follow up of cirrhosis/varices    Contact information: [address] [place]   [address] [phone]        PCP Follow up in 2 week(s).   Why: Per case management list, please contact a primary care provider for a hospital follow up visit.          No Known Allergies  Consultations:  GI   Procedures/Studies: DG Orthopantogram  Result Date: 11/03/2019 CLINICAL DATA:  Initial evaluation for dental caries. EXAM: ORTHOPANTOGRAM/PANORAMIC COMPARISON:  None. FINDINGS: Dental caries are seen about the mandibular cuspid bilaterally (tooth numbers 22 and 27). Mandible is demineralized at the base of the left cuspid. The left first bicuspid is eroded and carious (tooth number 21). Dental carie present at the adjacent left second bicuspid (tooth number 20). Patient is otherwise edentulous. IMPRESSION: Multiple dental caries as above. Electronically Signed   By: Jeannine Boga M.D.   On: 11/03/2019 03:02   DG Abd 1 View  Result Date: 11/08/2019 CLINICAL DATA:  Abdominal distension with generalized abdominal pain, constipation and diarrhea 3 days. EXAM: ABDOMEN - 1 VIEW COMPARISON:  10/25/2019 FINDINGS: Bowel gas pattern is nonobstructive with mild fecal retention throughout the colon. There are a few air-filled nondilated small bowel loops over the right mid to lower abdomen. No free peritoneal air.  Previous cholecystectomy. Remainder of the exam is unchanged. IMPRESSION: Nonobstructive bowel gas pattern with mild fecal retention throughout the colon. Electronically Signed   By: Elberta Fortis M.D.   On: 11/08/2019 13:48   US Abdomen Complete  Result Date: 10/29/2019 CLINICAL DATA:  Abdominal pain EXAM: ABDOMEN ULTRASOUND COMPLETE COMPARISON:  10/23/2019 right upper quadrant ultrasound FINDINGS: Gallbladder: History of cholecystectomy. Common bile duct: Diameter: 7 mm. Where visualized, no filling defect. Liver: Heterogeneous, echogenic liver with surface lobulation suggesting cirrhosis. This would be a change from June 2020 MRI when surface lobulation was not seen,  although the caudate lobe was large. There is antegrade flow in the main portal vein. The adjacent hepatic artery is likely hypertrophic. No evident mass lesion. IVC: Not clearly seen Pancreas: Not clearly seen Spleen: Size and appearance within normal limits. Right Kidney: Length: 12.5 cm. Echogenicity within normal limits. No mass or hydronephrosis visualized. Left Kidney: Length: 12.3 cm. Echogenicity within normal limits. No mass or hydronephrosis visualized. Abdominal aorta: Largely obscured by bowel gas. Where seen proximally there is no aneurysm. Other findings: Small volume ascites with simple appearance, seen in multiple pockets in the abdomen. Deepest pocket over the over the liver is 15 mm, increased comparison. Left pleural effusion that is small where seen IMPRESSION: 1. Cirrhotic/steatotic appearance of the liver with small volume ascites that has increased from 10/23/2019 study. 2. Obscured pancreas, IVC, and aorta. Electronically Signed   By: Marnee Spring M.D.   On: 10/29/2019 08:50   US Abdomen Limited  Result Date: 10/23/2019 CLINICAL DATA:  59 year old male with abdominal pain. EXAM: ULTRASOUND ABDOMEN LIMITED RIGHT UPPER QUADRANT COMPARISON:  CT abdomen pelvis dated 03/23/2019. FINDINGS: Gallbladder: Cholecystectomy. Common bile duct: Diameter: 3 mm Liver: There is diffuse increased liver echogenicity most commonly seen in the setting of fatty infiltration. Superimposed inflammation or fibrosis is not excluded. Clinical correlation is recommended. Portal vein is patent on color Doppler imaging with normal direction of blood flow towards the liver. Other: Small perihepatic free fluid noted. IMPRESSION: 1. Cholecystectomy. 2. Fatty liver. 3. Small perihepatic ascites. Electronically Signed   By: Elgie Collard M.D.   On: 10/23/2019 01:14   AM DG Chest Port 1 View  Result Date: 10/24/2019 CLINICAL DATA:  Abnormal respiration EXAM: PORTABLE CHEST 1 VIEW COMPARISON:  Yesterday FINDINGS:  Normal heart size and stable mediastinal contours. Low volume chest. There is no edema, consolidation, effusion, or pneumothorax. Cholecystectomy clips. IMPRESSION: Negative low volume chest. Electronically Signed   By: Marnee Spring M.D.   On: 10/24/2019 05:58   DG CHEST PORT 1 VIEW  Result Date: 10/23/2019 CLINICAL DATA:  Hematemesis and weakness EXAM: PORTABLE CHEST 1 VIEW COMPARISON:  CT 09/17/2019, radiograph 09/16/2019 FINDINGS: Low lung volumes with more reticulonodular opacities throughout the left lung and some more focal consolidative opacity towards the apex. No pneumothorax or visible effusion. Cardiomediastinal contours are similar to prior counting for differences in technique. No acute osseous or soft tissue abnormality. Support devices overlie the chest. IMPRESSION: 1. Low lung volumes with more reticulonodular opacities throughout the left lung and some more focal consolidative opacity towards the apex. Findings could reflect an acute infectious or inflammatory process. Electronically Signed   By: Kreg Shropshire M.D.   On: 10/23/2019 03:36   DG Abd Portable 1V  Result Date: 10/25/2019 CLINICAL DATA:  59 year old male with abdominal pain. EXAM: PORTABLE ABDOMEN - 1 VIEW COMPARISON:  Abdominal radiograph dated 04/03/2019 and CT dated 09/15/2019 FINDINGS: Evaluation is limited due to body habitus. There  is no bowel dilatation or evidence of obstruction. Air is noted within the colon. No free air or radiopaque calculi identified. Right upper quadrant cholecystectomy clips. The osseous structures and soft tissues are grossly unremarkable. IMPRESSION: No evidence of bowel obstruction. No free air. Electronically Signed   By: Elgie Collard M.D.   On: 10/25/2019 02:31   IR ABDOMEN US LIMITED  Result Date: 11/08/2019 CLINICAL DATA:  59 year old male with cirrhosis and ascites EXAM: LIMITED ABDOMEN ULTRASOUND FOR ASCITES TECHNIQUE: Limited ultrasound survey for ascites was performed in all four  abdominal quadrants. COMPARISON:  11/05/2019 FINDINGS: Limited ultrasound demonstrates scant ascites. No paracentesis was performed. IMPRESSION: Scant ascites. Electronically Signed   By: Gilmer Mor D.O.   On: 11/08/2019 09:12   IR Paracentesis  Result Date: 11/05/2019 INDICATION: Patient with history of alcoholic cirrhosis, recurrent ascites. Request to IR for therapeutic paracentesis. EXAM: ULTRASOUND GUIDED THERAPEUTIC PARACENTESIS MEDICATIONS: 10 mL 1% lidocaine COMPLICATIONS: None immediate. PROCEDURE: Informed written consent was obtained from the patient after a discussion of the risks, benefits and alternatives to treatment. A timeout was performed prior to the initiation of the procedure. Initial ultrasound scanning demonstrates a moderate amount of ascites within the left lower abdominal quadrant. The left lower abdomen was prepped and draped in the usual sterile fashion. 1% lidocaine was used for local anesthesia. Following this, a 19 gauge, 7-cm, Yueh catheter was introduced. An ultrasound image was saved for documentation purposes. The paracentesis was performed. The catheter was removed and a dressing was applied. The patient tolerated the procedure well without immediate post procedural complication. FINDINGS: A total of approximately 2.5 L of hazy yellow fluid was removed. IMPRESSION: Successful ultrasound-guided paracentesis yielding 2.5 liters of peritoneal fluid. Read by Lynnette Caffey, PA-C Electronically Signed   By: Richarda Overlie M.D.   On: 11/05/2019 11:20   IR Paracentesis  Result Date: 10/29/2019 INDICATION: Patient with cirrhosis secondary to alcohol use disorder, new onset ascites. Request is made for therapeutic paracentesis. EXAM: ULTRASOUND GUIDED THERAPEUTIC PARACENTESIS MEDICATIONS: 10 mL 1% lidocaine COMPLICATIONS: None immediate. PROCEDURE: Informed written consent was obtained from the patient after a discussion of the risks, benefits and alternatives to treatment. A  timeout was performed prior to the initiation of the procedure. Initial ultrasound scanning demonstrates a small amount of ascites within the right lower abdominal quadrant. The right lower abdomen was prepped and draped in the usual sterile fashion. 1% lidocaine was used for local anesthesia. Following this, a 19 gauge, 7-cm, Yueh catheter was introduced. An ultrasound image was saved for documentation purposes. The paracentesis was performed. The catheter was removed and a dressing was applied. The patient tolerated the procedure well without immediate post procedural complication. FINDINGS: A total of approximately 2.3 liters of yellow fluid was removed. Samples were sent to the laboratory as requested by the clinical team. IMPRESSION: Successful ultrasound-guided therapeutic paracentesis yielding 2.3 liters of peritoneal fluid. Read by: Loyce Dys PA-C Electronically Signed   By: Corlis Leak M.D.   On: 10/29/2019 13:57       Subjective: No more bleeding, nausea, or consitipation. He reports feeling better.   Discharge Exam: Vitals:   11/15/19 0352 11/15/19 0746  BP: 96/69 95/61  Pulse: 68 71  Resp: 16 17  Temp: 98.3 F (36.8 C) 98.1 F (36.7 C)  SpO2: 95% 96%   Vitals:   11/14/19 2005 11/15/19 0350 11/15/19 0352 11/15/19 0746  BP: 107/69 97/68 96/69  95/61  Pulse: 85 78 68 71  Resp: 16 16 16  17  Temp: 98.6 F (37 C) 98.3 F (36.8 C) 98.3 F (36.8 C) 98.1 F (36.7 C)  TempSrc: Oral   Oral  SpO2: 98% 95% 95% 96%  Weight:      Height:        General: Pt is alert, awake, not in acute distress Cardiovascular: RRR, S1/S2 present Respiratory: No respiratory distress.  Abdominal: Soft, NT, ND Extremities: no edema, no cyanosis    The results of significant diagnostics from this hospitalization (including imaging, microbiology, ancillary and laboratory) are listed below for reference.     Microbiology: No results found for this or any previous visit (from the past 240  hour(s)).   Labs: BNP (last 3 results) No results for input(s): BNP in the last 8760 hours. Basic Metabolic Panel: Recent Labs  Lab 11/09/19 0543 11/12/19 0618 11/14/19 0600  NA 139 137 137  K 3.7 3.8 3.8  CL 105 102 105  CO2 26 26 25   GLUCOSE 91 93 92  BUN 5* 6 <5*  CREATININE 0.56* 0.62 0.56*  CALCIUM 8.2* 8.3* 8.5*  MG 1.6* 1.7 1.7   Liver Function Tests: Recent Labs  Lab 11/09/19 0543 11/12/19 0618 11/14/19 0600  AST 73* 75* 81*  ALT 24 25 27   ALKPHOS 106 101 105  BILITOT 1.2 1.0 1.2  PROT 5.9* 6.0* 6.4*  ALBUMIN 1.9* 2.0* 2.1*   No results for input(s): LIPASE, AMYLASE in the last 168 hours. No results for input(s): AMMONIA in the last 168 hours. CBC: Recent Labs  Lab 11/09/19 0543 11/12/19 0618 11/14/19 0600  WBC 6.2 6.9 6.7  HGB 10.5* 10.4* 11.1*  HCT 32.7* 32.7* 34.5*  MCV 105.1* 105.5* 102.4*  PLT 170 176 170   Cardiac Enzymes: No results for input(s): CKTOTAL, CKMB, CKMBINDEX, TROPONINI in the last 168 hours. BNP: Invalid input(s): POCBNP CBG: No results for input(s): GLUCAP in the last 168 hours. D-Dimer No results for input(s): DDIMER in the last 72 hours. Hgb A1c No results for input(s): HGBA1C in the last 72 hours. Lipid Profile No results for input(s): CHOL, HDL, LDLCALC, TRIG, CHOLHDL, LDLDIRECT in the last 72 hours. Thyroid function studies No results for input(s): TSH, T4TOTAL, T3FREE, THYROIDAB in the last 72 hours.  Invalid input(s): FREET3 Anemia work up No results for input(s): VITAMINB12, FOLATE, FERRITIN, TIBC, IRON, RETICCTPCT in the last 72 hours. Urinalysis    Component Value Date/Time   COLORURINE YELLOW 03/23/2019 1606   APPEARANCEUR CLEAR 03/23/2019 1606   LABSPEC 1.010 03/23/2019 1606   PHURINE 8.5 (H) 03/23/2019 1606   GLUCOSEU NEGATIVE 03/23/2019 1606   HGBUR NEGATIVE 03/23/2019 1606   BILIRUBINUR small 04/18/2019 0956   KETONESUR NEGATIVE 03/23/2019 1606   PROTEINUR Positive (A) 04/18/2019 0956   PROTEINUR  NEGATIVE 03/23/2019 1606   UROBILINOGEN 0.2 04/18/2019 0956   NITRITE neg 04/18/2019 0956   NITRITE NEGATIVE 03/23/2019 1606   LEUKOCYTESUR Negative 04/18/2019 0956   LEUKOCYTESUR NEGATIVE 03/23/2019 1606   Sepsis Labs Invalid input(s): PROCALCITONIN,  WBC,  LACTICIDVEN Microbiology No results found for this or any previous visit (from the past 240 hour(s)).   Time coordinating discharge: Over 33 minutes  SIGNED:   Ky BarbanSolianny D Iysis Germain, MD  [facility] 11/15/2019, 12:37 PM

## 2019-11-15 NOTE — TOC Transition Note (Signed)
Transition of Care [facility]) - CM/SW Discharge Note   Patient Details  Name: Tom Bender MRN: [id number] Date of Birth: [birth year]  Transition of Care [facility] CM/SW Contact:  Truddie Hidden, LCSW Phone Number: 11/15/2019, 1:49 PM   Clinical Narrative:    Discharged home. Patient states that he will be staying with his cousin for about 2 weeks. Information for the [facility] and homeless shelters added to his AVS and reviewed with patient. Meds sent to [facility] pharmacy and will be delivered to room prior to discharge.   PCP appt made for 2/12. Family will provide transportation. No other needs at this time. Case closed to this CSW.    Final next level of care: Home/Self Care Barriers to Discharge: Barriers Resolved   Patient Goals and CMS Choice   CMS Medicare.gov Compare Post Acute Care list provided to:: Patient Choice offered to / list presented to : Patient  Discharge Placement                       Discharge Plan and Services In-house Referral: Clinical Social Work                                   Social Determinants of Health (SDOH) Interventions     Readmission Risk Interventions No flowsheet data found.

## 2019-11-30 ENCOUNTER — Ambulatory Visit: Payer: Self-pay | Admitting: Family Medicine

## 2019-11-30 ENCOUNTER — Ambulatory Visit: Payer: Self-pay | Attending: Internal Medicine | Admitting: Internal Medicine

## 2019-11-30 ENCOUNTER — Other Ambulatory Visit: Payer: Self-pay

## 2019-11-30 DIAGNOSIS — M545 Low back pain, unspecified: Secondary | ICD-10-CM

## 2019-11-30 DIAGNOSIS — F32A Depression, unspecified: Secondary | ICD-10-CM | POA: Insufficient documentation

## 2019-11-30 DIAGNOSIS — Z8719 Personal history of other diseases of the digestive system: Secondary | ICD-10-CM | POA: Insufficient documentation

## 2019-11-30 DIAGNOSIS — F329 Major depressive disorder, single episode, unspecified: Secondary | ICD-10-CM | POA: Insufficient documentation

## 2019-11-30 DIAGNOSIS — G8929 Other chronic pain: Secondary | ICD-10-CM | POA: Insufficient documentation

## 2019-11-30 DIAGNOSIS — F413 Other mixed anxiety disorders: Secondary | ICD-10-CM

## 2019-11-30 DIAGNOSIS — F419 Anxiety disorder, unspecified: Secondary | ICD-10-CM | POA: Insufficient documentation

## 2019-11-30 DIAGNOSIS — F1010 Alcohol abuse, uncomplicated: Secondary | ICD-10-CM

## 2019-11-30 DIAGNOSIS — F10188 Alcohol abuse with other alcohol-induced disorder: Secondary | ICD-10-CM

## 2019-11-30 DIAGNOSIS — K7031 Alcoholic cirrhosis of liver with ascites: Secondary | ICD-10-CM | POA: Insufficient documentation

## 2019-11-30 DIAGNOSIS — Z09 Encounter for follow-up examination after completed treatment for conditions other than malignant neoplasm: Secondary | ICD-10-CM

## 2019-11-30 MED ORDER — FLUOXETINE HCL 20 MG PO CAPS: 20.0000 mg | ORAL_CAPSULE | Freq: Every day | ORAL | 1 refills | Status: AC

## 2019-11-30 MED ORDER — FUROSEMIDE 20 MG PO TABS: 20.0000 mg | ORAL_TABLET | Freq: Every day | ORAL | 1 refills | Status: DC

## 2019-11-30 MED ORDER — SPIRONOLACTONE 25 MG PO TABS: 25.0000 mg | ORAL_TABLET | Freq: Every day | ORAL | 2 refills | Status: DC

## 2019-11-30 MED ORDER — METHOCARBAMOL 500 MG PO TABS: 500.0000 mg | ORAL_TABLET | Freq: Two times a day (BID) | ORAL | 2 refills | Status: AC | PRN

## 2019-11-30 MED ORDER — PANTOPRAZOLE SODIUM 40 MG PO TBEC: 40.0000 mg | DELAYED_RELEASE_TABLET | Freq: Two times a day (BID) | ORAL | 3 refills | Status: AC

## 2019-11-30 NOTE — Progress Notes (Signed)
Virtual Visit via Telephone Note Due to current restrictions/limitations of in-office visits due to the COVID-19 pandemic, this scheduled clinical appointment was converted to a telehealth visit Of note, I usually have new hospital follow-ups visit in person but patient had requested televisit I connected with [name] on 11/30/19 at 12:15 p.m by telephone and verified that I am speaking with the correct person using two identifiers. I am in my office.  The patient is at home.  Only the patient and myself participated in this encounter.  I discussed the limitations, risks, security and privacy concerns of performing an evaluation and management service by telephone and the availability of in person appointments. I also discussed with the patient that there may be a patient responsible charge related to this service. The patient expressed understanding and agreed to proceed.  History of Present Illness: Patient with history of EtOH abuse, cirrhosis with ascites, esophageal varices, homeless, protein malnutrition.  Purpose of today's visit is to establish care and for hospital follow-up.  Patient hospitalized 10/22/2019-11/14/2018 with GI bleed.  He presented with hematochezia/melena for 2 weeks prior to admission along with some hemoptysis.  Patient was consuming alcohol daily.  Hemoglobin was 8.4 was hypotensive.  He was given fresh frozen plasma and 2 units of packed red blood cells.  EGD showed small esophageal varices without stigmata of bleeding, small Mallory-Weiss tear without stigmata of bleeding and possible small gastric varices and mild portal gastropathy. -Abdominal ultrasound showed cirrhosis with hepatic steatosis.  He had some abdominal distention and pain.  Paracentesis was done x2.  Patient placed on spironolactone and Lasix. -He was evaluated by PT and SNF was recommended but patient improved.. -Found to have severe protein caloric malnutrition with low albumin level.  Today: No  previous PCP Currently staying with cousin  No further bleeding However he is having swelling in legs and feet again.  He has been trying to keep legs elevated.   He has not had any increased abdominal girth. Limiting salt in foods Compliant with Furosemide and Spironolactone Reports he quit ETOH completely.  Not in any support group.  Reports limited transportation  He is on Prozac which he tells me he takes for depression and anxiety.  Reports that he is doing okay on this medication.    Request RF on Robaxin which he takes for chronic back pain. Reports having chronic lower back pain for yrs.  Getting worse with yrs.  Reports having scoliosis of spine.  He was a Music therapist for many years and he thinks that he messed up his back with a bending and heavy lifting.  Was on Tramadol in past from clinic in [place].  Did not find it useful -Also reports pain in the legs which he attributes to arthritis in the knees. In process of applying for disability  Past medical, surgical, social and family histories reviewed. Current Outpatient Medications on File Prior to Visit  Medication Sig Dispense Refill  . FLUoxetine (PROZAC) 20 MG capsule Take 1 capsule (20 mg total) by mouth daily. 30 capsule 0  . folic acid (FOLVITE) 1 MG tablet Take 1 tablet (1 mg total) by mouth daily. 60 tablet 0  . furosemide (LASIX) 20 MG tablet Take 0.5 tablets (10 mg total) by mouth every other day. 30 tablet 0  . lactulose (CHRONULAC) 10 GM/15ML solution Take 15 mLs (10 g total) by mouth 3 (three) times daily. 1892 mL 0  . methocarbamol (ROBAXIN) 500 MG tablet Take 1 tablet (500 mg total) by  mouth every 8 (eight) hours as needed for muscle spasms. 30 tablet 0  . Multiple Vitamin (MULTIVITAMIN WITH MINERALS) TABS tablet Take 1 tablet by mouth daily. 60 tablet 0  . pantoprazole (PROTONIX) 40 MG tablet Take 1 tablet (40 mg total) by mouth 2 (two) times daily before a meal. 60 tablet 0  . spironolactone (ALDACTONE) 25 MG  tablet Take 1 tablet (25 mg total) by mouth every other day. 30 tablet 0  . sucralfate (CARAFATE) 1 GM/10ML suspension Take 10 mLs (1 g total) by mouth 4 (four) times daily -  with meals and at bedtime. 420 mL 0  . thiamine 100 MG tablet Take 1 tablet (100 mg total) by mouth daily. 30 tablet 0   No current facility-administered medications on file prior to visit.      Observations/Objective: No direct observation done as this was a telephone encounter.    Chemistry      Component Value Date/Time   NA 137 11/14/2019 0600   NA 139 04/18/2019 1010   K 3.8 11/14/2019 0600   CL 105 11/14/2019 0600   CO2 25 11/14/2019 0600   BUN <5 (L) 11/14/2019 0600   BUN 3 (L) 04/18/2019 1010   CREATININE 0.56 (L) 11/14/2019 0600      Component Value Date/Time   CALCIUM 8.5 (L) 11/14/2019 0600   ALKPHOS 105 11/14/2019 0600   AST 81 (H) 11/14/2019 0600   ALT 27 11/14/2019 0600   BILITOT 1.2 11/14/2019 0600   BILITOT 0.7 04/18/2019 1010     Lab Results  Component Value Date   WBC 6.7 11/14/2019   HGB 11.1 (L) 11/14/2019   HCT 34.5 (L) 11/14/2019   MCV 102.4 (H) 11/14/2019   PLT 170 11/14/2019     Assessment and Plan: 1. Hospital discharge follow-up 2. History of upper gastrointestinal bleeding Patient to continue pantoprazole.  Advised and encouraged to abstain from alcohol which he says he has done. -Do not take any NSAIDs. Advised to pick up forms for the orange card/cone discount card and apply for it so that we can refer him to gastroenterology. - pantoprazole (PROTONIX) 40 MG tablet; Take 1 tablet (40 mg total) by mouth 2 (two) times daily before a meal.  Dispense: 60 tablet; Refill: 3  3. Alcohol abuse Advised to abstain.  He states that he has done that since hospital discharge.  4. Alcoholic cirrhosis of liver with ascites (HCC) He is having some increased swelling in the legs.  He was discharged on Lasix 20 mg half a tablet daily.  I will have him increase it to a full tablet  daily.  Spironolactone was 25 mg every other day, I recommended that he increase it to daily.  Advised to limit salt in the foods. - furosemide (LASIX) 20 MG tablet; Take 1 tablet (20 mg total) by mouth daily.  Dispense: 30 tablet; Refill: 1 - spironolactone (ALDACTONE) 25 MG tablet; Take 1 tablet (25 mg total) by mouth daily.  Dispense: 30 tablet; Refill: 2  5. Chronic low back pain without sciatica, unspecified back pain laterality - methocarbamol (ROBAXIN) 500 MG tablet; Take 1 tablet (500 mg total) by mouth 2 (two) times daily as needed for muscle spasms.  Dispense: 30 tablet; Refill: 2  6. Anxiety and depression - FLUoxetine (PROZAC) 20 MG capsule; Take 1 capsule (20 mg total) by mouth daily.  Dispense: 30 capsule; Refill: 1   Follow Up Instructions: Follow-up with me in person in 6 weeks.  He will come  to the lab next week to have CBC and chemistry done.   I discussed the assessment and treatment plan with the patient. The patient was provided an opportunity to ask questions and all were answered. The patient agreed with the plan and demonstrated an understanding of the instructions.   The patient was advised to call back or seek an in-person evaluation if the symptoms worsen or if the condition fails to improve as anticipated.  I provided 20 minutes of non-face-to-face time during this encounter.   Karle Plumber, MD

## 2019-11-30 NOTE — Progress Notes (Signed)
Patient has been called and DOB has been verified. Patient has been screened and transferred to PCP to start phone visit.   Pain in back and legs are swelling  Patient is requesting a stronger medication for his back pain.

## 2020-01-18 ENCOUNTER — Other Ambulatory Visit: Payer: Self-pay

## 2020-01-18 ENCOUNTER — Emergency Department (HOSPITAL_BASED_OUTPATIENT_CLINIC_OR_DEPARTMENT_OTHER)
Admission: EM | Admit: 2020-01-18 | Discharge: 2020-01-19 | Disposition: A | Discharge status: Home / Self Care | Payer: Self-pay | Attending: Emergency Medicine | Admitting: Emergency Medicine

## 2020-01-18 ENCOUNTER — Encounter (HOSPITAL_BASED_OUTPATIENT_CLINIC_OR_DEPARTMENT_OTHER): Payer: Self-pay | Admitting: *Deleted

## 2020-01-18 ENCOUNTER — Emergency Department (HOSPITAL_BASED_OUTPATIENT_CLINIC_OR_DEPARTMENT_OTHER): Payer: Self-pay

## 2020-01-18 DIAGNOSIS — S20411A Abrasion of right back wall of thorax, initial encounter: Secondary | ICD-10-CM | POA: Insufficient documentation

## 2020-01-18 DIAGNOSIS — F10229 Alcohol dependence with intoxication, unspecified: Secondary | ICD-10-CM | POA: Insufficient documentation

## 2020-01-18 DIAGNOSIS — F17210 Nicotine dependence, cigarettes, uncomplicated: Secondary | ICD-10-CM | POA: Insufficient documentation

## 2020-01-18 DIAGNOSIS — Y908 Blood alcohol level of 240 mg/100 ml or more: Secondary | ICD-10-CM | POA: Insufficient documentation

## 2020-01-18 DIAGNOSIS — Z23 Encounter for immunization: Secondary | ICD-10-CM | POA: Insufficient documentation

## 2020-01-18 DIAGNOSIS — F10220 Alcohol dependence with intoxication, uncomplicated: Secondary | ICD-10-CM

## 2020-01-18 DIAGNOSIS — S60511A Abrasion of right hand, initial encounter: Secondary | ICD-10-CM | POA: Insufficient documentation

## 2020-01-18 DIAGNOSIS — X58XXXA Exposure to other specified factors, initial encounter: Secondary | ICD-10-CM | POA: Insufficient documentation

## 2020-01-18 DIAGNOSIS — Y999 Unspecified external cause status: Secondary | ICD-10-CM | POA: Insufficient documentation

## 2020-01-18 DIAGNOSIS — K7030 Alcoholic cirrhosis of liver without ascites: Secondary | ICD-10-CM | POA: Insufficient documentation

## 2020-01-18 DIAGNOSIS — I10 Essential (primary) hypertension: Secondary | ICD-10-CM | POA: Insufficient documentation

## 2020-01-18 DIAGNOSIS — R531 Weakness: Secondary | ICD-10-CM | POA: Insufficient documentation

## 2020-01-18 DIAGNOSIS — T07XXXA Unspecified multiple injuries, initial encounter: Secondary | ICD-10-CM

## 2020-01-18 DIAGNOSIS — Y9389 Activity, other specified: Secondary | ICD-10-CM | POA: Insufficient documentation

## 2020-01-18 DIAGNOSIS — E876 Hypokalemia: Secondary | ICD-10-CM | POA: Insufficient documentation

## 2020-01-18 DIAGNOSIS — Y929 Unspecified place or not applicable: Secondary | ICD-10-CM | POA: Insufficient documentation

## 2020-01-18 LAB — POCT I-STAT EG7
Acid-Base Excess: 1 mmol/L (ref 0.0–2.0)
Bicarbonate: 26 mmol/L (ref 20.0–28.0)
Calcium, Ion: 1.12 mmol/L — ABNORMAL LOW (ref 1.15–1.40)
HCT: 45 % (ref 39.0–52.0)
Hemoglobin: 15.3 g/dL (ref 13.0–17.0)
O2 Saturation: 73 %
Patient temperature: 98
Potassium: 3.1 mmol/L — ABNORMAL LOW (ref 3.5–5.1)
Sodium: 145 mmol/L (ref 135–145)
TCO2: 27 mmol/L (ref 22–32)
pCO2, Ven: 41.4 mmHg — ABNORMAL LOW (ref 44.0–60.0)
pH, Ven: 7.404 (ref 7.250–7.430)
pO2, Ven: 38 mmHg (ref 32.0–45.0)

## 2020-01-18 LAB — COMPREHENSIVE METABOLIC PANEL
ALT: 24 U/L (ref 0–44)
AST: 67 U/L — ABNORMAL HIGH (ref 15–41)
Albumin: 3.2 g/dL — ABNORMAL LOW (ref 3.5–5.0)
Alkaline Phosphatase: 97 U/L (ref 38–126)
Anion gap: 12 (ref 5–15)
BUN: 7 mg/dL (ref 6–20)
CO2: 25 mmol/L (ref 22–32)
Calcium: 8.5 mg/dL — ABNORMAL LOW (ref 8.9–10.3)
Chloride: 104 mmol/L (ref 98–111)
Creatinine, Ser: 0.63 mg/dL (ref 0.61–1.24)
GFR calc Af Amer: >60 mL/min (ref >60)
GFR calc non Af Amer: >60 mL/min (ref >60)
Glucose, Bld: 78 mg/dL (ref 70–99)
Potassium: 3.1 mmol/L — ABNORMAL LOW (ref 3.5–5.1)
Sodium: 141 mmol/L (ref 135–145)
Total Bilirubin: 0.9 mg/dL (ref 0.3–1.2)
Total Protein: 8 g/dL (ref 6.5–8.1)

## 2020-01-18 LAB — ETHANOL: Alcohol, Ethyl (B): 400 mg/dL (ref <10)

## 2020-01-18 LAB — PROTIME-INR
INR: 1.2 (ref 0.8–1.2)
Prothrombin Time: 14.9 seconds (ref 11.4–15.2)

## 2020-01-18 LAB — APTT: aPTT: 33 seconds (ref 24–36)

## 2020-01-18 LAB — AMMONIA: Ammonia: 25 umol/L (ref 9–35)

## 2020-01-18 MED ORDER — THIAMINE HCL 100 MG/ML IJ SOLN
INTRAMUSCULAR | Status: AC
  Filled 2020-01-18: qty 2

## 2020-01-18 MED ORDER — THIAMINE HCL 100 MG/ML IJ SOLN
100.0000 mg | Freq: Once | INTRAMUSCULAR | Status: AC
  Administered 2020-01-18: 100 mg via INTRAVENOUS
  Filled 2020-01-18: qty 2

## 2020-01-18 MED ORDER — SODIUM CHLORIDE 0.9 % IV BOLUS
1000.0000 mL | Freq: Once | INTRAVENOUS | Status: AC
  Administered 2020-01-18: 1000 mL via INTRAVENOUS

## 2020-01-18 MED ORDER — TETANUS-DIPHTH-ACELL PERTUSSIS 5-2.5-18.5 LF-MCG/0.5 IM SUSP
0.5000 mL | Freq: Once | INTRAMUSCULAR | Status: AC
  Administered 2020-01-18: 0.5 mL via INTRAMUSCULAR
  Filled 2020-01-18: qty 0.5

## 2020-01-18 NOTE — ED Provider Notes (Addendum)
Tom Bender: Tom Spurling, MD, FACEP  CSN: [id number] MRN: [id number] ARRIVAL: 01/18/20 at 2234 ROOM: MH03/[facility]03   CHIEF COMPLAINT  Altered Mental Status  Level 5 caveat: Altered mental status HISTORY OF PRESENT ILLNESS  01/18/20 11:00 PM Tom Bender is a 59 y.o. male with history of alcoholic cirrhosis.  He states he has been alcohol free for 3 months.  He also admits that he discontinued his lactulose 2 weeks ago because it caused him to defecate "everywhere".  His cousin dropped him off at the ED due to altered mental status and inability to walk due to weakness and ataxia.  His speech is noted to be slurred.  Staff notes abrasions to his right hand and to his right lower posterior rib region although the patient is unsure if he has fallen.  He rates associated pain is a 10 out of 10, worse with palpation or movement.  The patient himself is complaining of edema of "from my knees to my feet".    Past Medical History:  Diagnosis Date  . Anxiety   . Depression   . ETOH abuse   . GERD (gastroesophageal reflux disease)   . Hypertension   . Liver disease   . Lower GI bleed 10/23/2019  . Renal insufficiency     Past Surgical History:  Procedure Laterality Date  . BIOPSY  03/28/2019   Procedure: BIOPSY;  Surgeon: Arta Silence, MD;  Location: [facility];  Service: Endoscopy;;  . CHOLECYSTECTOMY N/A 03/29/2019   Procedure: LAPAROSCOPIC CHOLECYSTECTOMY WITH INTRAOPERATIVE CHOLANGIOGRAM;  Surgeon: Donnie Mesa, MD;  Location: [facility];  Service: General;  Laterality: N/A;  . ESOPHAGOGASTRODUODENOSCOPY (EGD) WITH PROPOFOL Left 03/28/2019   Procedure: ESOPHAGOGASTRODUODENOSCOPY (EGD) WITH PROPOFOL;  Surgeon: Arta Silence, MD;  Location[facility] ENDOSCOPY;  Service: Endoscopy;  Laterality: Left;  . ESOPHAGOGASTRODUODENOSCOPY (EGD) WITH PROPOFOL N/A 10/24/2019   Procedure: ESOPHAGOGASTRODUODENOSCOPY (EGD) WITH PROPOFOL;  Surgeon: Irene Shipper, MD;  Location: [facility]  ENDOSCOPY;  Service: Endoscopy;  Laterality: N/A;  . IR PARACENTESIS  10/29/2019  . IR PARACENTESIS  11/05/2019    No family history on file.  Social History   Tobacco Use  . Smoking status: Current Every Day Smoker    Packs/day: 1.50    Types: Cigarettes  . Smokeless tobacco: Never Used  Substance Use Topics  . Alcohol use: Yes    Alcohol/week: 12.0 standard drinks    Types: 12 Cans of beer per week  . Drug use: Not Currently    Prior to Admission medications   Medication Sig Start Date End Date Taking? Authorizing Provider  FLUoxetine (PROZAC) 20 MG capsule Take 1 capsule (20 mg total) by mouth daily. 11/30/19   Ladell Pier, MD  folic acid (FOLVITE) 1 MG tablet Take 1 tablet (1 mg total) by mouth daily. 11/16/19   Blain Pais, MD  furosemide (LASIX) 20 MG tablet Take 1 tablet (20 mg total) by mouth daily. 11/30/19   Ladell Pier, MD  lactulose (CHRONULAC) 10 GM/15ML solution Take 15 mLs (10 g total) by mouth 3 (three) times daily. 11/15/19   Blain Pais, MD  methocarbamol (ROBAXIN) 500 MG tablet Take 1 tablet (500 mg total) by mouth 2 (two) times daily as needed for muscle spasms. 11/30/19   Ladell Pier, MD  Multiple Vitamin (MULTIVITAMIN WITH MINERALS) TABS tablet Take 1 tablet by mouth daily. 11/16/19   Blain Pais, MD  pantoprazole (PROTONIX) 40 MG tablet Take 1 tablet (40 mg total)  by mouth 2 (two) times daily before a meal. 11/30/19   Marcine Matar, MD  spironolactone (ALDACTONE) 25 MG tablet Take 1 tablet (25 mg total) by mouth daily. 11/30/19   Marcine Matar, MD  sucralfate (CARAFATE) 1 GM/10ML suspension Take 10 mLs (1 g total) by mouth 4 (four) times daily -  with meals and at bedtime. 11/15/19   Ky Barban, MD  thiamine 100 MG tablet Take 1 tablet (100 mg total) by mouth daily. 11/15/19   Ky Barban, MD    Allergies Patient has no known allergies.   REVIEW OF SYSTEMS     PHYSICAL EXAMINATION    Initial Vital Signs Blood pressure 105/79, pulse (!) 107, temperature 98 F (36.7 C), temperature source Oral, resp. rate 20, height 5\' 9"  (1.753 m), weight 72.7 kg, SpO2 96 %.  Examination General: Well-developed, cachectic male in no acute distress; appears older than age of record HENT: normocephalic; atraumatic Eyes: pupils equal, round and reactive to light; extraocular muscles intact Neck: supple Heart: regular rate and rhythm; tachycardia Lungs: clear to auscultation bilaterally Abdomen: soft; nondistended; mild left lower quadrant tenderness; no masses or hepatosplenomegaly; bowel sounds present Extremities: No deformity; full range of motion; pulses normal; trace edema of lower legs Neurologic: Awake, alert and oriented x2; dysarthria; ataxia; motor function intact in all extremities and symmetric; no facial droop Skin: Warm and dry; abrasions to right lower posterior rib area (appear acute) and ulnar side of right hand (appear both acute and subacute):      Psychiatric: Flat affect; difficult to maintain a train of thought   RESULTS  Summary of this visit's results, reviewed and interpreted by myself:   EKG Interpretation  Date/Time:    Ventricular Rate:    PR Interval:    QRS Duration:   QT Interval:    QTC Calculation:   R Axis:     Text Interpretation:         Laboratory Studies: Results for orders placed or performed during the hospital encounter of 01/18/20 (from the past 24 hour(s))  Ammonia     Status: None   Collection Time: 01/18/20 11:01 PM  Result Value Ref Range   Ammonia 25 9 - 35 umol/L  Ethanol     Status: Abnormal   Collection Time: 01/18/20 11:01 PM  Result Value Ref Range   Alcohol, Ethyl (B) 400 (HH) <10 mg/dL  Comprehensive metabolic panel     Status: Abnormal   Collection Time: 01/18/20 11:01 PM  Result Value Ref Range   Sodium 141 135 - 145 mmol/L   Potassium 3.1 (L) 3.5 - 5.1 mmol/L   Chloride 104 98 - 111 mmol/L   CO2 25 22 -  32 mmol/L   Glucose, Bld 78 70 - 99 mg/dL   BUN 7 6 - 20 mg/dL   Creatinine, Ser 03/19/20 0.61 - 1.24 mg/dL   Calcium 8.5 (L) 8.9 - 10.3 mg/dL   Total Protein 8.0 6.5 - 8.1 g/dL   Albumin 3.2 (L) 3.5 - 5.0 g/dL   AST 67 (H) 15 - 41 U/L   ALT 24 0 - 44 U/L   Alkaline Phosphatase 97 38 - 126 U/L   Total Bilirubin 0.9 0.3 - 1.2 mg/dL   GFR calc non Af Amer >60 >60 mL/min   GFR calc Af Amer >60 >60 mL/min   Anion gap 12 5 - 15  Protime-INR     Status: None   Collection Time: 01/18/20 11:01 PM  Result Value Ref Range   Prothrombin Time 14.9 11.4 - 15.2 seconds   INR 1.2 0.8 - 1.2  APTT     Status: None   Collection Time: 01/18/20 11:01 PM  Result Value Ref Range   aPTT 33 24 - 36 seconds  POCT I-Stat EG7     Status: Abnormal   Collection Time: 01/18/20 11:17 PM  Result Value Ref Range   pH, Ven 7.404 7.250 - 7.430   pCO2, Ven 41.4 (L) 44.0 - 60.0 mmHg   pO2, Ven 38.0 32.0 - 45.0 mmHg   Bicarbonate 26.0 20.0 - 28.0 mmol/L   TCO2 27 22 - 32 mmol/L   O2 Saturation 73.0 %   Acid-Base Excess 1.0 0.0 - 2.0 mmol/L   Sodium 145 135 - 145 mmol/L   Potassium 3.1 (L) 3.5 - 5.1 mmol/L   Calcium, Ion 1.12 (L) 1.15 - 1.40 mmol/L   HCT 45.0 39.0 - 52.0 %   Hemoglobin 15.3 13.0 - 17.0 g/dL   Patient temperature 78.4 F    Collection site IV START    Drawn by Nurse    Sample type VENOUS    Comment NOTIFIED PHYSICIAN    Imaging Studies: DG Ribs Unilateral W/Chest Right  Result Date: 01/19/2020 CLINICAL DATA:  Recent fall with right-sided pain, initial encounter EXAM: RIGHT RIBS AND CHEST - 3+ VIEW COMPARISON:  10/23/2018 FINDINGS: Cardiac shadows within normal limits. The lungs are well aerated bilaterally. No focal infiltrate or sizable effusion is seen. No acute rib fractures are noted. IMPRESSION: No evidence of acute rib fracture. Electronically Signed   By: Alcide Clever M.D.   On: 01/19/2020 00:41   DG Hand Complete Right  Result Date: 01/19/2020 CLINICAL DATA:  Recent fall with right hand  pain, initial encounter EXAM: RIGHT HAND - COMPLETE 3+ VIEW COMPARISON:  None. FINDINGS: No acute fracture or dislocation is noted. No soft tissue swelling is noted. Radiopaque density is noted in the soft tissues between the first and second metacarpals of uncertain chronicity. IMPRESSION: No acute fracture or dislocation.  No Radiopaque density in the soft tissues between the first and second metacarpals of uncertain chronicity. Correlate with the physical exam. Electronically Signed   By: Alcide Clever M.D.   On: 01/19/2020 00:40    ED COURSE and MDM  Nursing notes, initial and subsequent vitals signs, including pulse oximetry, reviewed and interpreted by myself.  Vitals:   01/19/20 0338 01/19/20 0500 01/19/20 0600 01/19/20 0700  BP: 103/77 110/74 114/78 117/84  Pulse: 99 91 88 94  Resp: 14 18 20 15   Temp:  98.2 F (36.8 C)    TempSrc:  Oral    SpO2: 91% 93% 94% 96%  Weight:      Height:       Medications  potassium chloride SA (KLOR-CON) CR tablet 40 mEq (has no administration in time range)  Tdap (BOOSTRIX) injection 0.5 mL (0.5 mLs Intramuscular Given 01/18/20 2359)  thiamine (B-1) injection 100 mg (100 mg Intravenous Given 01/18/20 2355)  sodium chloride 0.9 % bolus 1,000 mL ( Intravenous Stopped 01/19/20 0121)   12:29 AM IV fluids with thiamine given for significant alcohol intoxication.   6:33 AM Patient and his vital signs have remained stable overnight.  He is able to ambulate with assistance, usually using a cane and/or walker.  Ammonia is within normal limits and although ammonia level does not always correlate with hepatic encephalopathy I suspect his altered mental status was due to acute alcohol intoxication.  PROCEDURES  Procedures   ED DIAGNOSES     ICD-10-CM   1. Alcohol intoxication in active alcoholic without complication (HCC)  F10.220   2. Abrasion, multiple sites  T07.XXXA   3. Hypokalemia  E87.6        Carmel Garfield, Jonny Ruiz, MD 01/19/20 0300    Paula Libra, MD 01/19/20 [id number]

## 2020-01-18 NOTE — ED Triage Notes (Signed)
His cousin brings him to the ED due to unable to walk. He is slow to respond. Hx of alcoholism. He states he last drank 3 months ago. His speech is slow and slurred.

## 2020-01-18 NOTE — ED Notes (Signed)
Critical ETOH result of 400 received from lab; Dr Read Drivers.

## 2020-01-18 NOTE — ED Notes (Signed)
His cousin was asked to wait in his car due to comments to patient that he was going to kill him and he was going to be on the street with no where to live. He was not making the situation better during triage.

## 2020-01-19 MED ORDER — POTASSIUM CHLORIDE CRYS ER 20 MEQ PO TBCR
40.0000 meq | EXTENDED_RELEASE_TABLET | Freq: Once | ORAL | Status: AC
  Administered 2020-01-19: 40 meq via ORAL
  Filled 2020-01-19: qty 2

## 2020-01-19 NOTE — ED Notes (Signed)
Patient to xray.

## 2020-01-19 NOTE — ED Notes (Signed)
Patient very unsteady on feet. Ambulated with 2 person assist and walker which patient uses at home. Patient states continued weakness at this time. Provider informed. No new orders at this time.

## 2020-01-24 ENCOUNTER — Ambulatory Visit: Payer: Self-pay | Attending: Internal Medicine | Admitting: Internal Medicine

## 2020-01-24 ENCOUNTER — Ambulatory Visit (HOSPITAL_BASED_OUTPATIENT_CLINIC_OR_DEPARTMENT_OTHER): Payer: Self-pay | Admitting: Licensed Clinical Social Worker

## 2020-01-24 ENCOUNTER — Other Ambulatory Visit: Payer: Self-pay

## 2020-01-24 ENCOUNTER — Encounter: Payer: Self-pay | Admitting: Internal Medicine

## 2020-01-24 VITALS — BP 111/73 | HR 92 | Temp 98.4°F | Resp 16 | Ht 69.0 in | Wt 167.6 lb

## 2020-01-24 DIAGNOSIS — M25562 Pain in left knee: Secondary | ICD-10-CM

## 2020-01-24 DIAGNOSIS — F1010 Alcohol abuse, uncomplicated: Secondary | ICD-10-CM

## 2020-01-24 DIAGNOSIS — F411 Generalized anxiety disorder: Secondary | ICD-10-CM

## 2020-01-24 DIAGNOSIS — F32A Depression, unspecified: Secondary | ICD-10-CM

## 2020-01-24 DIAGNOSIS — M25561 Pain in right knee: Secondary | ICD-10-CM

## 2020-01-24 DIAGNOSIS — Z5900 Homelessness unspecified: Secondary | ICD-10-CM

## 2020-01-24 DIAGNOSIS — M545 Low back pain, unspecified: Secondary | ICD-10-CM

## 2020-01-24 DIAGNOSIS — K7030 Alcoholic cirrhosis of liver without ascites: Secondary | ICD-10-CM

## 2020-01-24 DIAGNOSIS — F331 Major depressive disorder, recurrent, moderate: Secondary | ICD-10-CM

## 2020-01-24 DIAGNOSIS — G8929 Other chronic pain: Secondary | ICD-10-CM

## 2020-01-24 DIAGNOSIS — F329 Major depressive disorder, single episode, unspecified: Secondary | ICD-10-CM

## 2020-01-24 DIAGNOSIS — F419 Anxiety disorder, unspecified: Secondary | ICD-10-CM

## 2020-01-24 MED ORDER — FUROSEMIDE 20 MG PO TABS: 20.0000 mg | ORAL_TABLET | Freq: Every day | ORAL | 1 refills | Status: DC

## 2020-01-24 MED ORDER — DICLOFENAC SODIUM 1 % EX GEL: 2.0000 g | Freq: Four times a day (QID) | CUTANEOUS | 1 refills | Status: AC

## 2020-01-24 NOTE — Patient Instructions (Addendum)
  Please remember to go to [facility] radiology department to have x-rays done on your knees.  I have sent a prescription to your pharmacy for an anti-inflammatory pain gel called Voltaren gel.  You can run this on your knees and the lower back.  You still have refills on the muscle relaxant called methocarbamol.  You just need to ask for it at the [facility] pharmacy.  Please try to abstain from alcohol use.  Please let me know once you have been approved for Medicaid so that I can refer you to the liver specialist and for some physical therapy on your back.  You should avoid heavy lifting.

## 2020-01-24 NOTE — Progress Notes (Signed)
Patient ID: Tom Bender, male    DOB: 1961/10/17  MRN: [id number]  CC: Hospitalization Follow-up (ED)   Subjective: Tom Bender is a 59 y.o. male who presents for chronic ds management.  Last eval 11/30/2019 His concerns today include:  Patient with history of EtOH abuse, cirrhosis with ascites, esophageal varices with UGIB, homeless, protein malnutrition, dep/anxiety, scoliosis.  I had an initial telemedicine visit with him in February to establish care. Case worker is working on trying to get him Medicaid.  He did not get applications to apply for [employer] as recommended on last encounter.  Tells me he is also working on trying to get  Disability.  EtOH abuse/alcoholic cirrhosis: Seen in ER 01/18/2020 with abrasions to hands and posterior thorax.  Alcohol level found to be elevated.  He was given Tdap.  Potassium level was low and was replaced.  Patient has medications with him today including a bottle for potassium chloride ER 20 mEq with date prescribed on it being 09/2019.  Patient states he has been taking 1 daily ever since he was seen in the emergency room.  He stopped taking lactulose about a month ago because it was making him have embarrassing accidents of incontinence.  He states that he has 2 loose bowel movements every day without the lactulose.  Reports compliance with furosemide and spironolactone.  He also has folic acid and thiamine and reports compliance with taking those. Patient states that he had done well since he last spoke with me and abstaining from alcohol but had a slip up that day.  He is currently living with a cousin until the end of this month.  After that he is not sure where he will go.  Thinks he may have to live out of his [place].  Depression/anxiety: He has his bottle of Prozac with him.  He feels that it helps some.  He is agreeable to meeting with our LCSW today.  Continues to complain of chronic lower back pain and is wearing a support band around  the waist today.  She feels pressure and thinks he has a pinched nerve in the lower back.  Muscles tighten up in the lower back.  He gets good relief with Robaxin but is out of the medication.  I had given him refills when I spoke with him back in February.  He has not filled those as yet.  Pain is worse when he stands for too long.  He sometimes noticed tingling in both legs.  No shooting pains down the legs.  Had x-rays of the lumbar spine in the summer of last year that revealed some arthritis changes. -Also complains of chronic pain in both knees right greater than left.  RT knee gives out from time to time.  Reports being told in the past that he has arthritis in the knees.  Mild joint swelling. Patient Active Problem List   Diagnosis Date Noted  . Anxiety and depression 11/30/2019  . Chronic low back pain without sciatica 11/30/2019  . Alcoholic cirrhosis of liver with ascites ([place]) 11/30/2019  . History of upper gastrointestinal bleeding 11/30/2019  . Protein-calorie malnutrition, severe 10/27/2019  . Pressure injury of skin 10/24/2019  . Mallory-Weiss tear   . GI bleed 10/23/2019  . Alcoholic hepatitis 71/03/2693  . Sinus tachycardia 10/23/2019  . SIRS (systemic inflammatory response syndrome) ([place]) 03/23/2019  . Hyponatremia 03/23/2019  . Hypokalemia 03/23/2019  . Benign essential HTN 03/23/2019  . Alcohol abuse 03/23/2019  . Lactic  acidosis 03/23/2019  . Pancreatic cyst 03/23/2019     Current Outpatient Medications on File Prior to Visit  Medication Sig Dispense Refill  . Potassium Chloride ER 20 MEQ TBCR Take 20 mEq by mouth daily.    Marland Kitchen FLUoxetine (PROZAC) 20 MG capsule Take 1 capsule (20 mg total) by mouth daily. 30 capsule 1  . folic acid (FOLVITE) 1 MG tablet Take 1 tablet (1 mg total) by mouth daily. 60 tablet 0  . lactulose (CHRONULAC) 10 GM/15ML solution Take 15 mLs (10 g total) by mouth 3 (three) times daily. 1892 mL 0  . methocarbamol (ROBAXIN) 500 MG tablet Take 1  tablet (500 mg total) by mouth 2 (two) times daily as needed for muscle spasms. 30 tablet 2  . Multiple Vitamin (MULTIVITAMIN WITH MINERALS) TABS tablet Take 1 tablet by mouth daily. 60 tablet 0  . pantoprazole (PROTONIX) 40 MG tablet Take 1 tablet (40 mg total) by mouth 2 (two) times daily before a meal. 60 tablet 3  . spironolactone (ALDACTONE) 25 MG tablet Take 1 tablet (25 mg total) by mouth daily. 30 tablet 2  . sucralfate (CARAFATE) 1 GM/10ML suspension Take 10 mLs (1 g total) by mouth 4 (four) times daily -  with meals and at bedtime. 420 mL 0  . thiamine 100 MG tablet Take 1 tablet (100 mg total) by mouth daily. 30 tablet 0   No current facility-administered medications on file prior to visit.    No Known Allergies  Social History   Socioeconomic History  . Marital status: Single    Spouse name: Not on file  . Number of children: Not on file  . Years of education: Not on file  . Highest education level: Not on file  Occupational History  . Not on file  Tobacco Use  . Smoking status: Current Every Day Smoker    Packs/day: 1.50    Types: Cigarettes  . Smokeless tobacco: Never Used  Substance and Sexual Activity  . Alcohol use: Yes    Alcohol/week: 12.0 standard drinks    Types: 12 Cans of beer per week  . Drug use: Not Currently  . Sexual activity: Not on file  Other Topics Concern  . Not on file  Social History Narrative  . Not on file   Social Determinants of Health   Financial Resource Strain:   . Difficulty of Paying Living Expenses:   Food Insecurity:   . Worried About Programme researcher, broadcasting/film/video in the Last Year:   . Barista in the Last Year:   Transportation Needs:   . Freight forwarder (Medical):   Marland Kitchen Lack of Transportation (Non-Medical):   Physical Activity:   . Days of Exercise per Week:   . Minutes of Exercise per Session:   Stress:   . Feeling of Stress :   Social Connections:   . Frequency of Communication with Friends and Family:   .  Frequency of Social Gatherings with Friends and Family:   . Attends Religious Services:   . Active Member of Clubs or Organizations:   . Attends Banker Meetings:   Marland Kitchen Marital Status:   Intimate Partner Violence:   . Fear of Current or Ex-Partner:   . Emotionally Abused:   Marland Kitchen Physically Abused:   . Sexually Abused:     No family history on file.  Past Surgical History:  Procedure Laterality Date  . BIOPSY  03/28/2019   Procedure: BIOPSY;  Surgeon: Willis Modena, MD;  Location: [facility] ENDOSCOPY;  Service: Endoscopy;;  . CHOLECYSTECTOMY N/A 03/29/2019   Procedure: LAPAROSCOPIC CHOLECYSTECTOMY WITH INTRAOPERATIVE CHOLANGIOGRAM;  Surgeon: Manus Rudd, MD;  Location: [facility] OR;  Service: General;  Laterality: N/A;  . ESOPHAGOGASTRODUODENOSCOPY (EGD) WITH PROPOFOL Left 03/28/2019   Procedure: ESOPHAGOGASTRODUODENOSCOPY (EGD) WITH PROPOFOL;  Surgeon: Willis Modena, MD;  Location[facility] ENDOSCOPY;  Service: Endoscopy;  Laterality: Left;  . ESOPHAGOGASTRODUODENOSCOPY (EGD) WITH PROPOFOL N/A 10/24/2019   Procedure: ESOPHAGOGASTRODUODENOSCOPY (EGD) WITH PROPOFOL;  Surgeon: Hilarie Fredrickson, MD;  Location: [facility] ENDOSCOPY;  Service: Endoscopy;  Laterality: N/A;  . IR PARACENTESIS  10/29/2019  . IR PARACENTESIS  11/05/2019    ROS: Review of Systems Negative except as stated above  PHYSICAL EXAM: BP 111/73   Pulse 92   Temp 98.4 F (36.9 C)   Resp 16   Ht 5\' 9"  (1.753 m)   Wt 167 lb 9.6 oz (76 kg)   SpO2 95%   BMI 24.75 kg/m   Physical Exam  General appearance - alert, well appearing, middle-age Caucasian male and in no distress Mental status - normal mood, behavior, speech, dress, motor activity, and thought processes.  Patient very talkative. Neck - supple, no significant adenopathy Chest - clear to auscultation, no wheezes, rales or rhonchi, symmetric air entry Heart - normal rate, regular rhythm, normal S1, S2, no murmurs, rubs, clicks or gallops Abdomen - soft, nontender,  nondistended, no masses or organomegaly Extremities -trace lower extremity edema MSK: I had him remove the support belt from his waist.  There is slight tenderness on palpation of the lumbar spine.  Straight leg raise negative.  Power in both lower extremities 5/5 bilaterally.  Gait is slightly bent forward with the trunk. Knees: Mild enlargement of the joints.  No signs to suggest joint effusion.  Slow passive range of motion on both sides right greater than left. Depression screen [facility] 2/9 01/24/2020 04/18/2019  Decreased Interest 2 0  Down, Depressed, Hopeless 2 1  PHQ - 2 Score 4 1  Altered sleeping 3 -  Tired, decreased energy 2 -  Change in appetite 1 -  Feeling bad or failure about yourself  3 -  Trouble concentrating 2 -  Moving slowly or fidgety/restless 3 -  Suicidal thoughts 1 -  PHQ-9 Score 19 -    CMP Latest Ref Rng & Units 01/18/2020 01/18/2020 11/14/2019  Glucose 70 - 99 mg/dL - 78 92  BUN 6 - 20 mg/dL - 7 11/16/2019)  Creatinine <4(Q - 1.24 mg/dL - 0.34 7.42)  Sodium 135 - 145 mmol/L 145 141 137  Potassium 3.5 - 5.1 mmol/L 3.1(L) 3.1(L) 3.8  Chloride 98 - 111 mmol/L - 104 105  CO2 22 - 32 mmol/L - 25 25  Calcium 8.9 - 10.3 mg/dL - 8.5(L) 8.5(L)  Total Protein 6.5 - 8.1 g/dL - 8.0 5.95(G)  Total Bilirubin 0.3 - 1.2 mg/dL - 0.9 1.2  Alkaline Phos 38 - 126 U/L - 97 105  AST 15 - 41 U/L - 67(H) 81(H)  ALT 0 - 44 U/L - 24 27   Lipid Panel  No results found for: CHOL, TRIG, HDL, CHOLHDL, VLDL, LDLCALC, LDLDIRECT  CBC    Component Value Date/Time   WBC 6.7 11/14/2019 0600   RBC 3.37 (L) 11/14/2019 0600   HGB 15.3 01/18/2020 2317   HCT 45.0 01/18/2020 2317   PLT 170 11/14/2019 0600   MCV 102.4 (H) 11/14/2019 0600   MCH 32.9 11/14/2019 0600   MCHC 32.2 11/14/2019 0600   RDW  17.3 (H) 11/14/2019 0600   LYMPHSABS 1.4 10/26/2019 0526   MONOABS 0.6 10/26/2019 0526   EOSABS 0.1 10/26/2019 0526   BASOSABS 0.1 10/26/2019 0526    ASSESSMENT AND PLAN: 1. Alcoholic cirrhosis of  liver without ascites (H[facility]) I will recheck chemistry today to see whether potassium level has normalized.  He has some trace edema in the legs and no signs to suggest ascites in the abdomen.  Depending on the results of his chemistry.  We may be able to decrease the dose of the furosemide or spironolactone. - furosemide (LASIX) 20 MG tablet; Take 1 tablet (20 mg total) by mouth daily.  Dispense: 30 tablet; Refill: 1 - Basic Metabolic Panel  2. Chronic low back pain without sciatica, unspecified back pain laterality Once approved for Medicaid, we can refer him for some physical therapy.  In the meantime I will prescribe some diclofenac's gel.  Advised patient that he still has refills on the methocarbamol - diclofenac Sodium (VOLTAREN) 1 % GEL; Apply 2 g topically 4 (four) times daily. To knees and lower back  Dispense: 100 g; Refill: 1  3. Alcohol abuse Strongly advised him to abstain completely.  He is agreeable to meeting with the LCSW today  4. Anxiety and depression Reports he is doing okay on Prozac but does have a high PHQ 9 score.  Continue Prozac.  He will meet with LCSW today.   5. Homeless LCSW to see him today.  6. Chronic pain of both knees Likely arthritis. - diclofenac Sodium (VOLTAREN) 1 % GEL; Apply 2 g topically 4 (four) times daily. To knees and lower back  Dispense: 100 g; Refill: 1 - DG Knee Complete 4 Views Left; Future - DG Knee Complete 4 Views Right; Future    Patient was given the opportunity to ask questions.  Patient verbalized understanding of the plan and was able to repeat key elements of the plan.   Orders Placed This Encounter  Procedures  . DG Knee Complete 4 Views Left  . DG Knee Complete 4 Views Right  . Basic Metabolic Panel     Requested Prescriptions   Signed Prescriptions Disp Refills  . furosemide (LASIX) 20 MG tablet 30 tablet 1    Sig: Take 1 tablet (20 mg total) by mouth daily.  . diclofenac Sodium (VOLTAREN) 1 % GEL 100 g 1     Sig: Apply 2 g topically 4 (four) times daily. To knees and lower back    Return in about 3 months (around 04/24/2020).  Jonah Blue, MD, FACP

## 2020-01-25 LAB — BASIC METABOLIC PANEL
BUN/Creatinine Ratio: 18 (ref 9–20)
BUN: 15 mg/dL (ref 6–24)
CO2: 23 mmol/L (ref 20–29)
Calcium: 8.9 mg/dL (ref 8.7–10.2)
Chloride: 103 mmol/L (ref 96–106)
Creatinine, Ser: 0.84 mg/dL (ref 0.76–1.27)
GFR calc Af Amer: 113 mL/min/{1.73_m2} (ref >59)
GFR calc non Af Amer: 98 mL/min/{1.73_m2} (ref >59)
Glucose: 88 mg/dL (ref 65–99)
Potassium: 3.7 mmol/L (ref 3.5–5.2)
Sodium: 138 mmol/L (ref 134–144)

## 2020-01-26 NOTE — Progress Notes (Signed)
Let pt know that his potassium level and kidney function are normal.

## 2020-01-28 ENCOUNTER — Telehealth: Payer: Self-pay

## 2020-01-28 NOTE — Telephone Encounter (Signed)
Contacted pt to go over lab results pt is aware and doesn't have any questions or concerns 

## 2020-02-05 NOTE — BH Specialist Note (Signed)
Integrated Behavioral Health Initial Visit  MRN: [id number] Name: Tom Bender  Number of Integrated Behavioral Health Clinician visits:: 1/6 Session Start time: 4:20 PM  Session End time: 4:40 PM Total time: 20  Type of Service: Integrated Behavioral Health- Individual Interpretor:No. Interpretor Name and Language: NA   Warm Hand Off Completed.       SUBJECTIVE: Tom Bender is a 59 y.o. male accompanied by self Patient was referred by Dr. Laural Benes for substance use. Patient reports the following symptoms/concerns: Pt reports difficulty managing mental health and substance use conditions Duration of problem: Ongoing; Severity of problem: severe  OBJECTIVE: Mood: Anxious and Affect: Appropriate Risk of harm to self or others: No plan to harm self or others Pt scored positive on phq9; however, denied current SI/HI. Protective factors identified and crisis intervention resources provided  LIFE CONTEXT: Family and Social: Pt receives limited support from family. Pt is residing with cousin temporarily School/Work: Pt has a pending disability case and is working with [employer]. Pt has no income Self-Care: Pt reports hx of substance use (alcohol) Life Changes: Pt is grieving the loss of sister (January 2021) who he provided care for. He is currently homeless and has very limited support  GOALS ADDRESSED: Patient will: 1. Reduce symptoms of: anxiety, depression and stress 2. Increase knowledge and/or ability of: coping skills and healthy habits  3. Demonstrate ability to: Increase healthy adjustment to current life circumstances, Increase adequate support systems for patient/family and Decrease self-medicating behaviors  INTERVENTIONS: Interventions utilized: Solution-Focused Strategies, Supportive Counseling, Psychoeducation and/or Health Education and Link to [employer]  Standardized Assessments completed: GAD-7 and PHQ 2&9 with C-SSRS  ASSESSMENT: Patient  currently experiencing increase in depression and anxiety symptoms triggered by grief and psychosocial stressors. Pt scored positive on phq9; however, denied current SI/HI. Protective factors identified and crisis intervention resources provided.   Patient may benefit from medication management, substance use treatment, and therapy. LCSW provided patient with information on [employer], [employer], behavioral health agencies, and information on how to apply for financial counseling.   Pt was successful in identifying healthy coping skills to assist in management of stress.   PLAN: 1. Follow up with behavioral health clinician on : Contact LCSW with additional behavioral health and/or resource needs 2. Behavioral recommendations: Utilize strategies and resources provided 3. Referral(s): Integrated Art gallery manager (In Clinic), Community Mental Health Services (LME/Outside Clinic) and [employer]:  Finances and Housing 4. "From scale of 1-10, how likely are you to follow plan?":   Bridgett Larsson, LCSW 02/05/20 3:33 PM

## 2020-03-28 ENCOUNTER — Other Ambulatory Visit: Payer: Self-pay | Admitting: Internal Medicine

## 2020-03-28 DIAGNOSIS — K7031 Alcoholic cirrhosis of liver with ascites: Secondary | ICD-10-CM

## 2020-03-28 DIAGNOSIS — K7030 Alcoholic cirrhosis of liver without ascites: Secondary | ICD-10-CM

## 2020-04-28 ENCOUNTER — Ambulatory Visit: Payer: Self-pay | Admitting: Internal Medicine

## 2021-10-16 IMAGING — DX DG ORTHOPANTOGRAM /PANORAMIC
1 series · 1 of 1 positions shown · non-contrast
Comparison: None.

CLINICAL DATA: Initial evaluation for dental caries.

EXAM:
ORTHOPANTOGRAM/PANORAMIC

[view not recorded]
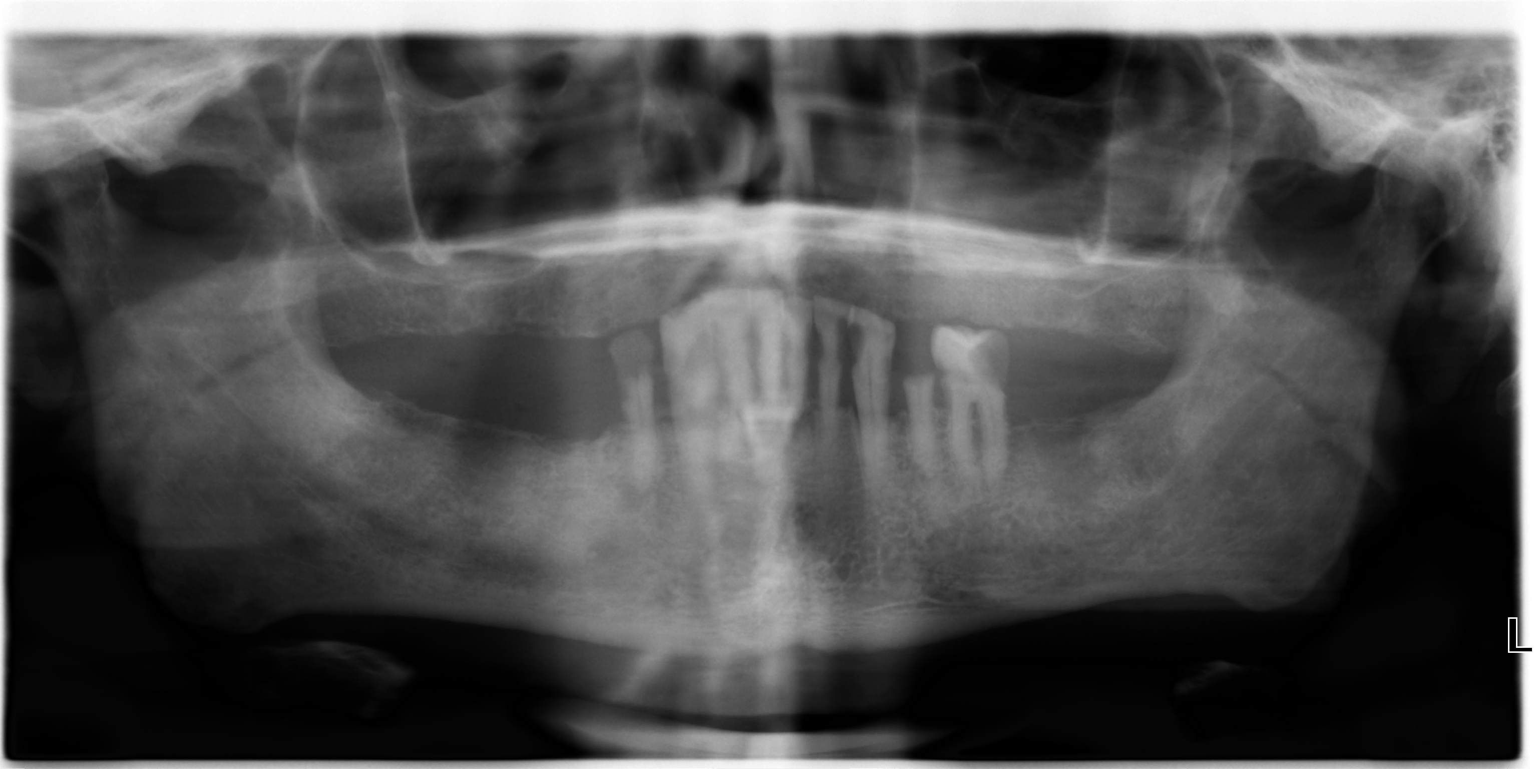

[1 of 1 positions shown; findings below may reference images not displayed]

FINDINGS: Dental caries are seen about the mandibular cuspid bilaterally
(tooth numbers 22 and 27). Mandible is demineralized at the base of
the left cuspid. The left first bicuspid is eroded and carious
(tooth number 21). Dental Gerberth Luis present at the adjacent left second
bicuspid (tooth number 20). Patient is otherwise edentulous.
IMPRESSION: Multiple dental caries as above.

## 2022-01-02 IMAGING — DX DG HAND COMPLETE 3+V*R*
3 series · 3 of 3 positions shown · non-contrast
Comparison: None.

CLINICAL DATA: Recent fall with right hand pain, initial encounter

EXAM:
RIGHT HAND - COMPLETE 3+ VIEW

[hand pa]
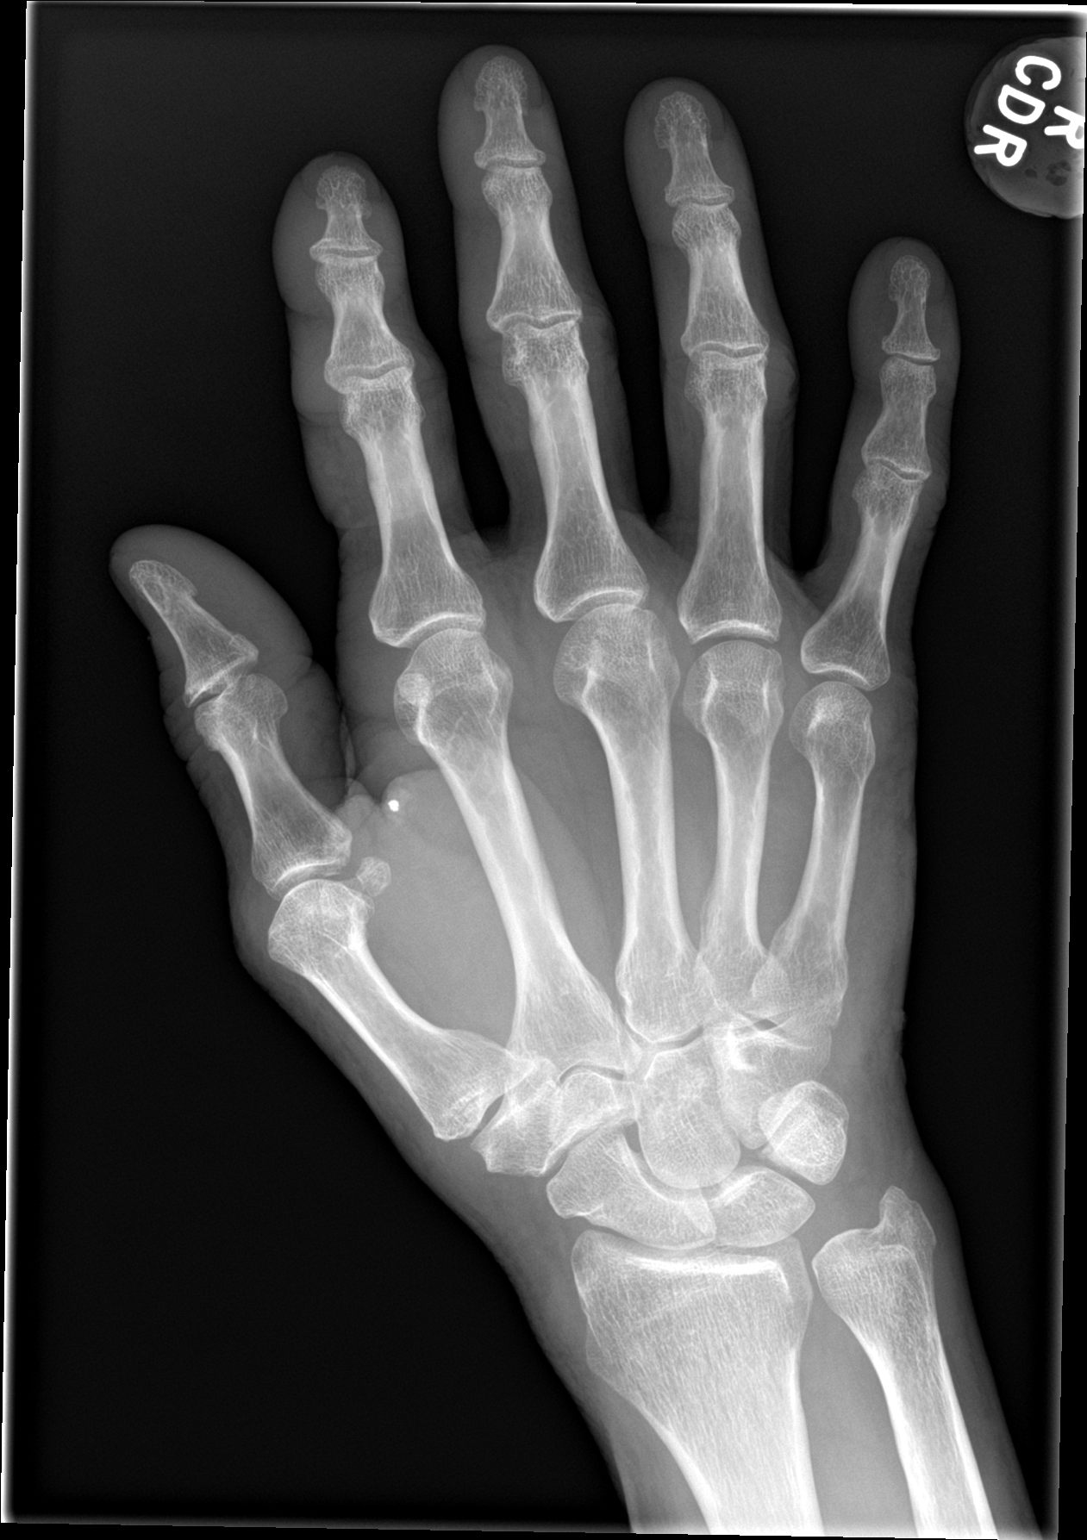

[hand obl]
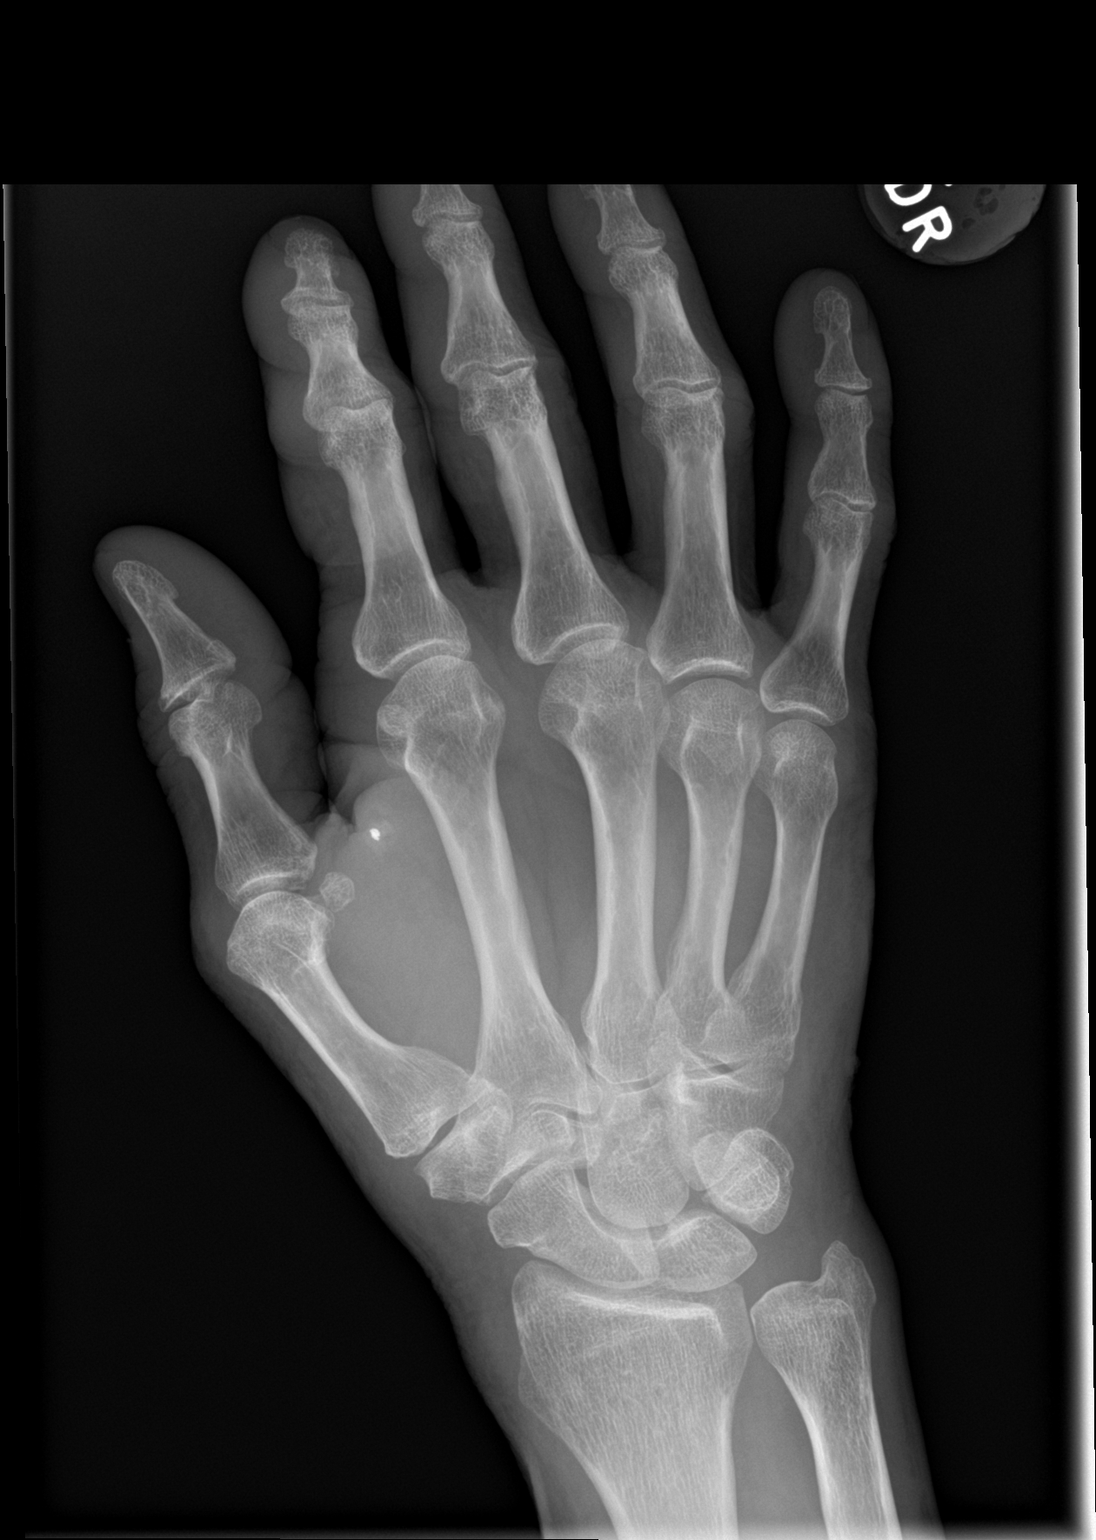

[hand lat]
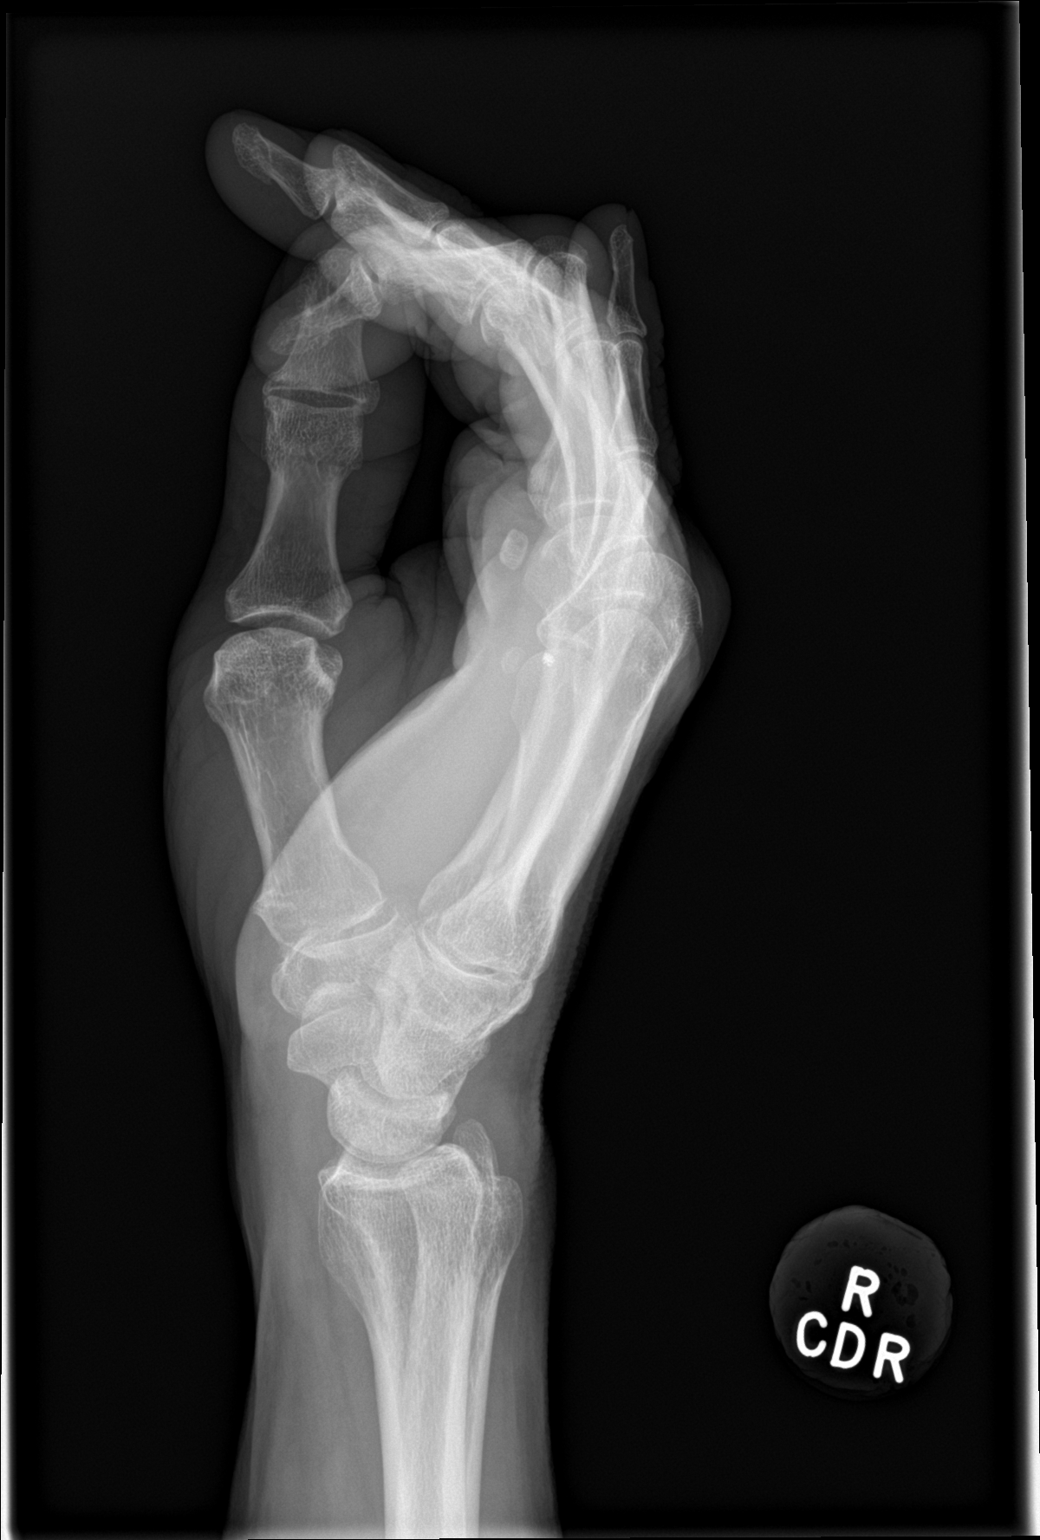

[3 of 3 positions shown; findings below may reference images not displayed]

FINDINGS: No acute fracture or dislocation is noted. No soft tissue swelling
is noted. Radiopaque density is noted in the soft tissues between
the first and second metacarpals of uncertain chronicity.
IMPRESSION: No acute fracture or dislocation.  No

Radiopaque density in the soft tissues between the first and second
metacarpals of uncertain chronicity. Correlate with the physical
exam.
